# Patient Record
Sex: Female | Born: 1950
Health system: Southern US, Community
[De-identification: ages and names within clinical notes are randomized; demographics above are authoritative.]

## PROBLEM LIST (undated history)

## (undated) DIAGNOSIS — Z8739 Personal history of other diseases of the musculoskeletal system and connective tissue: Secondary | ICD-10-CM

## (undated) DIAGNOSIS — E213 Hyperparathyroidism, unspecified: Secondary | ICD-10-CM

## (undated) DIAGNOSIS — J45909 Unspecified asthma, uncomplicated: Secondary | ICD-10-CM

## (undated) DIAGNOSIS — M4300 Spondylolysis, site unspecified: Secondary | ICD-10-CM

## (undated) DIAGNOSIS — G935 Compression of brain: Secondary | ICD-10-CM

## (undated) DIAGNOSIS — E890 Postprocedural hypothyroidism: Secondary | ICD-10-CM

## (undated) DIAGNOSIS — I10 Essential (primary) hypertension: Secondary | ICD-10-CM

## (undated) DIAGNOSIS — M5416 Radiculopathy, lumbar region: Secondary | ICD-10-CM

## (undated) DIAGNOSIS — K219 Gastro-esophageal reflux disease without esophagitis: Secondary | ICD-10-CM

## (undated) DIAGNOSIS — D571 Sickle-cell disease without crisis: Secondary | ICD-10-CM

## (undated) DIAGNOSIS — E079 Disorder of thyroid, unspecified: Secondary | ICD-10-CM

## (undated) DIAGNOSIS — E785 Hyperlipidemia, unspecified: Secondary | ICD-10-CM

## (undated) HISTORY — DX: Spondylolysis, site unspecified: M43.00

## (undated) HISTORY — DX: Personal history of other diseases of the musculoskeletal system and connective tissue: Z87.39

## (undated) HISTORY — DX: Sickle-cell disease without crisis: D57.1

## (undated) HISTORY — DX: Hyperlipidemia, unspecified: E78.5

## (undated) HISTORY — DX: Radiculopathy, lumbar region: M54.16

## (undated) HISTORY — DX: Gastro-esophageal reflux disease without esophagitis: K21.9

## (undated) HISTORY — DX: Postprocedural hypothyroidism: E89.0

## (undated) HISTORY — DX: Compression of brain: G93.5

## (undated) HISTORY — PX: CERVICAL LAMINECTOMY: SHX94

## (undated) HISTORY — DX: Essential (primary) hypertension: I10

## (undated) HISTORY — DX: Unspecified asthma, uncomplicated: J45.909

## (undated) HISTORY — DX: Hyperparathyroidism, unspecified: E21.3

---

## 1992-01-26 HISTORY — PX: ABDOMINAL HYSTERECTOMY: SHX81

## 1994-01-25 HISTORY — PX: ABDOMINAL HYSTERECTOMY: SHX81

## 2004-05-12 ENCOUNTER — Encounter: Admission: RE | Admit: 2004-05-12 | Discharge: 2004-05-12 | Payer: Self-pay | Admitting: Family Medicine

## 2004-05-25 ENCOUNTER — Encounter: Admission: RE | Admit: 2004-05-25 | Discharge: 2004-05-25 | Payer: Self-pay | Admitting: Family Medicine

## 2006-01-17 ENCOUNTER — Ambulatory Visit: Payer: Self-pay | Admitting: Family Medicine

## 2006-05-03 ENCOUNTER — Ambulatory Visit: Payer: Self-pay | Admitting: Family Medicine

## 2006-05-03 LAB — CONVERTED CEMR LAB
BUN: 12 mg/dL (ref 6–23)
Calcium: 10.4 mg/dL (ref 8.4–10.5)
Chloride: 107 meq/L (ref 96–112)
Creatinine, Ser: 0.6 mg/dL (ref 0.4–1.2)
GFR calc non Af Amer: 111 mL/min
Glucose, Bld: 107 mg/dL — ABNORMAL HIGH (ref 70–99)
Sodium: 141 meq/L (ref 135–145)

## 2006-05-18 ENCOUNTER — Ambulatory Visit: Payer: Self-pay | Admitting: Family Medicine

## 2006-05-20 ENCOUNTER — Ambulatory Visit: Payer: Self-pay | Admitting: Family Medicine

## 2006-06-09 ENCOUNTER — Encounter (INDEPENDENT_AMBULATORY_CARE_PROVIDER_SITE_OTHER): Payer: Self-pay | Admitting: Family Medicine

## 2006-12-12 ENCOUNTER — Emergency Department (HOSPITAL_COMMUNITY): Admission: EM | Admit: 2006-12-12 | Discharge: 2006-12-12 | Payer: Self-pay | Admitting: Emergency Medicine

## 2006-12-12 ENCOUNTER — Ambulatory Visit: Payer: Self-pay | Admitting: Family Medicine

## 2007-07-20 ENCOUNTER — Encounter: Admission: RE | Admit: 2007-07-20 | Discharge: 2007-07-20 | Payer: Self-pay | Admitting: Endocrinology

## 2007-07-20 ENCOUNTER — Encounter (INDEPENDENT_AMBULATORY_CARE_PROVIDER_SITE_OTHER): Payer: Self-pay | Admitting: Interventional Radiology

## 2007-07-20 ENCOUNTER — Other Ambulatory Visit: Admission: RE | Admit: 2007-07-20 | Discharge: 2007-07-20 | Payer: Self-pay | Admitting: Interventional Radiology

## 2007-12-27 ENCOUNTER — Encounter (HOSPITAL_COMMUNITY): Admission: RE | Admit: 2007-12-27 | Discharge: 2008-01-25 | Payer: Self-pay | Admitting: Endocrinology

## 2007-12-29 ENCOUNTER — Ambulatory Visit (HOSPITAL_COMMUNITY): Admission: RE | Admit: 2007-12-29 | Discharge: 2007-12-29 | Payer: Self-pay | Admitting: Endocrinology

## 2008-12-07 ENCOUNTER — Ambulatory Visit: Payer: Self-pay | Admitting: Radiology

## 2008-12-07 ENCOUNTER — Emergency Department (HOSPITAL_BASED_OUTPATIENT_CLINIC_OR_DEPARTMENT_OTHER): Admission: EM | Admit: 2008-12-07 | Discharge: 2008-12-07 | Payer: Self-pay | Admitting: Emergency Medicine

## 2010-02-15 ENCOUNTER — Encounter: Payer: Self-pay | Admitting: Family Medicine
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
## 2010-11-12 ENCOUNTER — Emergency Department (INDEPENDENT_AMBULATORY_CARE_PROVIDER_SITE_OTHER): Payer: No Typology Code available for payment source

## 2010-11-12 ENCOUNTER — Emergency Department (HOSPITAL_BASED_OUTPATIENT_CLINIC_OR_DEPARTMENT_OTHER)
Admission: EM | Admit: 2010-11-12 | Discharge: 2010-11-12 | Disposition: A | Discharge status: Home / Self Care | Payer: No Typology Code available for payment source | Attending: Emergency Medicine | Admitting: Emergency Medicine

## 2010-11-12 ENCOUNTER — Encounter: Payer: Self-pay | Admitting: *Deleted

## 2010-11-12 DIAGNOSIS — E079 Disorder of thyroid, unspecified: Secondary | ICD-10-CM | POA: Insufficient documentation

## 2010-11-12 DIAGNOSIS — M25519 Pain in unspecified shoulder: Secondary | ICD-10-CM

## 2010-11-12 DIAGNOSIS — M542 Cervicalgia: Secondary | ICD-10-CM

## 2010-11-12 DIAGNOSIS — S161XXA Strain of muscle, fascia and tendon at neck level, initial encounter: Secondary | ICD-10-CM

## 2010-11-12 DIAGNOSIS — Y92410 Unspecified street and highway as the place of occurrence of the external cause: Secondary | ICD-10-CM | POA: Insufficient documentation

## 2010-11-12 DIAGNOSIS — E119 Type 2 diabetes mellitus without complications: Secondary | ICD-10-CM | POA: Insufficient documentation

## 2010-11-12 DIAGNOSIS — S139XXA Sprain of joints and ligaments of unspecified parts of neck, initial encounter: Secondary | ICD-10-CM | POA: Insufficient documentation

## 2010-11-12 HISTORY — DX: Disorder of thyroid, unspecified: E07.9

## 2010-11-12 MED ORDER — IBUPROFEN 800 MG PO TABS: 800.0000 mg | ORAL_TABLET | Freq: Three times a day (TID) | ORAL | Status: DC

## 2010-11-12 NOTE — ED Provider Notes (Signed)
Medical screening examination/treatment/procedure(s) were performed by non-physician practitioner and as supervising physician I was immediately available for consultation/collaboration.  Krystal Teachey R Earley Grobe, MD 11/12/10 2150 
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
## 2010-11-12 NOTE — ED Notes (Signed)
When in to d/c pt-pt states she has allergy to ibuprofen and forgot to advise staff upon arrival and if ok to take tylenol-EDNP Sofia advised and agreeable-pt was not given rx ibuprofen

## 2010-11-17 ENCOUNTER — Ambulatory Visit (INDEPENDENT_AMBULATORY_CARE_PROVIDER_SITE_OTHER): Payer: Self-pay | Admitting: Family Medicine

## 2010-11-17 ENCOUNTER — Encounter: Payer: Self-pay | Admitting: Family Medicine

## 2010-11-17 VITALS — BP 131/70 | HR 61 | Temp 97.9°F | Ht 67.0 in | Wt 180.0 lb

## 2010-11-17 DIAGNOSIS — M549 Dorsalgia, unspecified: Secondary | ICD-10-CM

## 2010-11-17 DIAGNOSIS — S060X0A Concussion without loss of consciousness, initial encounter: Secondary | ICD-10-CM

## 2010-11-17 DIAGNOSIS — S139XXA Sprain of joints and ligaments of unspecified parts of neck, initial encounter: Secondary | ICD-10-CM

## 2010-11-17 DIAGNOSIS — S161XXA Strain of muscle, fascia and tendon at neck level, initial encounter: Secondary | ICD-10-CM

## 2010-11-17 MED ORDER — CYCLOBENZAPRINE HCL 10 MG PO TABS: 10.0000 mg | ORAL_TABLET | Freq: Three times a day (TID) | ORAL | Status: DC | PRN

## 2010-11-17 MED ORDER — TRAMADOL HCL 50 MG PO TABS: 50.0000 mg | ORAL_TABLET | Freq: Four times a day (QID) | ORAL | Status: DC | PRN
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
## 2010-11-18 ENCOUNTER — Ambulatory Visit: Payer: No Typology Code available for payment source | Attending: Family Medicine | Admitting: Physical Therapy

## 2010-11-18 DIAGNOSIS — M2560 Stiffness of unspecified joint, not elsewhere classified: Secondary | ICD-10-CM | POA: Insufficient documentation

## 2010-11-18 DIAGNOSIS — IMO0001 Reserved for inherently not codable concepts without codable children: Secondary | ICD-10-CM | POA: Insufficient documentation

## 2010-11-18 DIAGNOSIS — M25519 Pain in unspecified shoulder: Secondary | ICD-10-CM | POA: Insufficient documentation

## 2010-11-18 DIAGNOSIS — M542 Cervicalgia: Secondary | ICD-10-CM | POA: Insufficient documentation

## 2010-11-19 ENCOUNTER — Encounter: Payer: Self-pay | Admitting: Family Medicine

## 2010-11-19 DIAGNOSIS — S060X0A Concussion without loss of consciousness, initial encounter: Secondary | ICD-10-CM | POA: Insufficient documentation

## 2010-11-19 NOTE — Assessment & Plan Note (Signed)
Lumbar/cervical strains - Start PT with home exercise program, flexeril and tramadol as needed.  Cannot take nsaids.  Heat as needed for spasms.  Discussed ergonomic issues.  Cervical collar as this felt more comfortable to patient.  F/u in 3-4 weeks for reevaluation.
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
## 2010-11-19 NOTE — Assessment & Plan Note (Signed)
patient not having symptoms at rest now but does have symptoms with mental exertion.  Will keep her out of work until Monday and attempt to return at that time.  If symptoms recur, discussed she will have to be out of work for longer period of time.  No red flag symptoms.

## 2010-11-23 ENCOUNTER — Ambulatory Visit: Payer: No Typology Code available for payment source | Admitting: Physical Therapy

## 2010-11-25 ENCOUNTER — Encounter: Payer: No Typology Code available for payment source | Admitting: Physical Therapy

## 2010-11-26 ENCOUNTER — Ambulatory Visit: Payer: No Typology Code available for payment source | Attending: Family Medicine

## 2010-11-26 DIAGNOSIS — M25519 Pain in unspecified shoulder: Secondary | ICD-10-CM | POA: Insufficient documentation

## 2010-11-26 DIAGNOSIS — IMO0001 Reserved for inherently not codable concepts without codable children: Secondary | ICD-10-CM | POA: Insufficient documentation

## 2010-11-26 DIAGNOSIS — M2560 Stiffness of unspecified joint, not elsewhere classified: Secondary | ICD-10-CM | POA: Insufficient documentation

## 2010-11-26 DIAGNOSIS — M542 Cervicalgia: Secondary | ICD-10-CM | POA: Insufficient documentation

## 2010-11-30 ENCOUNTER — Ambulatory Visit: Payer: No Typology Code available for payment source | Admitting: Physical Therapy

## 2010-12-11 ENCOUNTER — Encounter: Payer: Self-pay | Admitting: Family Medicine

## 2010-12-11 ENCOUNTER — Ambulatory Visit (INDEPENDENT_AMBULATORY_CARE_PROVIDER_SITE_OTHER): Payer: Self-pay | Admitting: Family Medicine

## 2010-12-11 VITALS — BP 122/74 | HR 68 | Temp 97.9°F | Ht 67.0 in | Wt 180.0 lb

## 2010-12-11 DIAGNOSIS — M549 Dorsalgia, unspecified: Secondary | ICD-10-CM

## 2010-12-11 NOTE — Patient Instructions (Addendum)
I want you to complete another 3 weeks of physical therapy at least - I know this is difficult but it's the most likely thing to improve your condition.  [id number] Tylenol extra strength 1-2 tablets three times a day as needed for pain. Simple range of motion exercises within limits of pain to prevent further stiffness. Heat 15 minutes at a time 3-4 times a day to help with spasms. Watch head position when on computers, texting, when sleeping in bed - should in line with back to prevent further nerve traction and irritation. If not improving we will consider an MRI, injections, other medications, or surgery depending on how you are doing and what further imaging shows. Follow up with me in 3-4 weeks if not improving as expected.

## 2010-12-14 ENCOUNTER — Encounter: Payer: Self-pay | Admitting: Family Medicine

## 2010-12-14 NOTE — Assessment & Plan Note (Signed)
Lumbar/cervical strains - Still believe her symptoms are indicative of muscle strain in lumbar and cervical regions.  Strongly urged her to complete a total of 6 weeks of PT and be compliant with home exercises before I would consider further imaging (MRI).  Continue with tylenol, heat.  Has cervical collar to use as needed as well.  F/u in 3-4 weeks for recheck if not improving.
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
## 2010-12-22 ENCOUNTER — Ambulatory Visit: Payer: No Typology Code available for payment source | Admitting: Physical Therapy

## 2010-12-23 ENCOUNTER — Ambulatory Visit: Payer: No Typology Code available for payment source | Admitting: Physical Therapy

## 2010-12-24 ENCOUNTER — Ambulatory Visit: Payer: No Typology Code available for payment source | Admitting: Physical Therapy

## 2010-12-28 ENCOUNTER — Ambulatory Visit: Payer: No Typology Code available for payment source | Attending: Family Medicine | Admitting: Physical Therapy

## 2010-12-28 DIAGNOSIS — IMO0001 Reserved for inherently not codable concepts without codable children: Secondary | ICD-10-CM | POA: Insufficient documentation

## 2010-12-28 DIAGNOSIS — M25519 Pain in unspecified shoulder: Secondary | ICD-10-CM | POA: Insufficient documentation

## 2010-12-28 DIAGNOSIS — M2560 Stiffness of unspecified joint, not elsewhere classified: Secondary | ICD-10-CM | POA: Insufficient documentation

## 2010-12-28 DIAGNOSIS — M542 Cervicalgia: Secondary | ICD-10-CM | POA: Insufficient documentation

## 2010-12-29 ENCOUNTER — Ambulatory Visit: Payer: No Typology Code available for payment source | Admitting: Physical Therapy

## 2010-12-31 ENCOUNTER — Ambulatory Visit: Payer: No Typology Code available for payment source | Admitting: Physical Therapy

## 2011-01-04 ENCOUNTER — Ambulatory Visit: Payer: No Typology Code available for payment source | Admitting: Physical Therapy

## 2011-01-06 ENCOUNTER — Encounter: Payer: No Typology Code available for payment source | Admitting: Physical Therapy

## 2011-01-07 ENCOUNTER — Encounter: Payer: Self-pay | Admitting: Family Medicine

## 2011-01-07 ENCOUNTER — Ambulatory Visit: Payer: No Typology Code available for payment source | Admitting: Physical Therapy

## 2011-01-07 ENCOUNTER — Ambulatory Visit: Payer: No Typology Code available for payment source | Admitting: Family Medicine

## 2011-01-07 ENCOUNTER — Ambulatory Visit (INDEPENDENT_AMBULATORY_CARE_PROVIDER_SITE_OTHER): Payer: Self-pay | Admitting: Family Medicine

## 2011-01-07 DIAGNOSIS — M542 Cervicalgia: Secondary | ICD-10-CM

## 2011-01-07 DIAGNOSIS — M549 Dorsalgia, unspecified: Secondary | ICD-10-CM

## 2011-01-08 ENCOUNTER — Ambulatory Visit (HOSPITAL_BASED_OUTPATIENT_CLINIC_OR_DEPARTMENT_OTHER): Payer: No Typology Code available for payment source

## 2011-01-08 ENCOUNTER — Encounter: Payer: Self-pay | Admitting: Family Medicine
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
## 2011-01-09 ENCOUNTER — Ambulatory Visit (INDEPENDENT_AMBULATORY_CARE_PROVIDER_SITE_OTHER)
Admission: RE | Admit: 2011-01-09 | Discharge: 2011-01-09 | Disposition: A | Discharge status: Home / Self Care | Payer: No Typology Code available for payment source | Source: Ambulatory Visit | Attending: Family Medicine | Admitting: Family Medicine

## 2011-01-09 ENCOUNTER — Ambulatory Visit (HOSPITAL_BASED_OUTPATIENT_CLINIC_OR_DEPARTMENT_OTHER)
Admission: RE | Admit: 2011-01-09 | Discharge: 2011-01-09 | Disposition: A | Discharge status: Home / Self Care | Payer: No Typology Code available for payment source | Source: Ambulatory Visit | Attending: Family Medicine | Admitting: Family Medicine

## 2011-01-09 DIAGNOSIS — M5126 Other intervertebral disc displacement, lumbar region: Secondary | ICD-10-CM

## 2011-01-09 DIAGNOSIS — M545 Low back pain, unspecified: Secondary | ICD-10-CM

## 2011-01-09 DIAGNOSIS — M5090 Cervical disc disorder, unspecified, unspecified cervical region: Secondary | ICD-10-CM | POA: Insufficient documentation

## 2011-01-09 DIAGNOSIS — M549 Dorsalgia, unspecified: Secondary | ICD-10-CM

## 2011-01-09 DIAGNOSIS — G935 Compression of brain: Secondary | ICD-10-CM | POA: Insufficient documentation

## 2011-01-09 DIAGNOSIS — M5020 Other cervical disc displacement, unspecified cervical region: Secondary | ICD-10-CM | POA: Insufficient documentation

## 2011-01-09 DIAGNOSIS — M542 Cervicalgia: Secondary | ICD-10-CM | POA: Insufficient documentation

## 2011-01-09 DIAGNOSIS — M48061 Spinal stenosis, lumbar region without neurogenic claudication: Secondary | ICD-10-CM

## 2011-01-14 ENCOUNTER — Ambulatory Visit: Payer: No Typology Code available for payment source | Admitting: Physical Therapy

## 2011-01-20 ENCOUNTER — Encounter: Payer: Self-pay | Admitting: Family Medicine

## 2011-01-20 ENCOUNTER — Ambulatory Visit (INDEPENDENT_AMBULATORY_CARE_PROVIDER_SITE_OTHER): Payer: No Typology Code available for payment source | Admitting: Family Medicine

## 2011-01-20 ENCOUNTER — Ambulatory Visit: Payer: No Typology Code available for payment source | Admitting: Physical Therapy

## 2011-01-20 DIAGNOSIS — M549 Dorsalgia, unspecified: Secondary | ICD-10-CM

## 2011-01-20 DIAGNOSIS — M542 Cervicalgia: Secondary | ICD-10-CM

## 2011-01-21 ENCOUNTER — Encounter: Payer: No Typology Code available for payment source | Admitting: Physical Therapy

## 2011-01-21 ENCOUNTER — Encounter: Payer: Self-pay | Admitting: Family Medicine
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
## 2011-03-30 ENCOUNTER — Ambulatory Visit (INDEPENDENT_AMBULATORY_CARE_PROVIDER_SITE_OTHER): Payer: BC Managed Care – PPO | Admitting: Family Medicine

## 2011-03-30 ENCOUNTER — Encounter: Payer: Self-pay | Admitting: Family Medicine

## 2011-03-30 DIAGNOSIS — M549 Dorsalgia, unspecified: Secondary | ICD-10-CM

## 2011-03-30 DIAGNOSIS — M542 Cervicalgia: Secondary | ICD-10-CM

## 2011-03-31 ENCOUNTER — Encounter: Payer: Self-pay | Admitting: Family Medicine
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
## 2011-03-31 NOTE — Assessment & Plan Note (Signed)
a secondary concern to her neck pain now - she does have evidence of lumbar radiculopathy but believe most of her current symptoms are due to #1.
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
## 2011-04-02 ENCOUNTER — Telehealth: Payer: Self-pay | Admitting: *Deleted

## 2011-04-02 NOTE — Telephone Encounter (Signed)
Pt returned call.  States she has been taking 1/2 flexeril, and 1/4 tramadol because the tramadol makes her dizzy.  Advised her that Dr. Pearletha Forge would call in something different for pain if she would like.  She states she does not want anything stronger.  Suggested that she try increasing to at  least 1/2 tramadol or a whole tab if she can tolerate it without too much dizziness.  Also suggested that she take tylenol in between doses of tramadol b/c it has been helpful for her pain, and she cannot take NSAIDs 2/2 stomach upset.  Asked her to call back Monday if she has further concerns, and let her know that Gunnar Fusi is working on her Neurosurg appt.

## 2011-04-02 NOTE — Telephone Encounter (Signed)
Message copied by Mora Bellman on Fri Apr 02, 2011 10:43 AM ------      Message from: Darius Bump D      Created: Fri Apr 02, 2011  9:32 AM      Regarding: question      Contact: [phone]       Ms. Lequita Halt is taking her meds but still having a lot of pain.  I think Dr. Pearletha Forge has probably already explained to her what to do but could you find out?  We are waiting for her appt with Neuro but have not heard yet and I told her that.  I don't know what to tell her about the pain in the meantime though.  Thanks/lp

## 2011-04-02 NOTE — Telephone Encounter (Signed)
Called pt, left VM to return my call

## 2011-07-05 HISTORY — PX: CRANIECTOMY SUBOCCIPITAL W/ CERVICAL LAMINECTOMY / CHIARI: SUR327

## 2011-08-04 ENCOUNTER — Encounter (INDEPENDENT_AMBULATORY_CARE_PROVIDER_SITE_OTHER): Payer: BC Managed Care – PPO | Admitting: Ophthalmology

## 2011-08-04 DIAGNOSIS — E1139 Type 2 diabetes mellitus with other diabetic ophthalmic complication: Secondary | ICD-10-CM

## 2011-08-04 DIAGNOSIS — H2510 Age-related nuclear cataract, unspecified eye: Secondary | ICD-10-CM

## 2011-08-04 DIAGNOSIS — H43819 Vitreous degeneration, unspecified eye: Secondary | ICD-10-CM

## 2011-08-04 DIAGNOSIS — E11319 Type 2 diabetes mellitus with unspecified diabetic retinopathy without macular edema: Secondary | ICD-10-CM

## 2011-08-04 DIAGNOSIS — H33309 Unspecified retinal break, unspecified eye: Secondary | ICD-10-CM

## 2011-08-04 DIAGNOSIS — H3530 Unspecified macular degeneration: Secondary | ICD-10-CM

## 2011-08-12 ENCOUNTER — Ambulatory Visit (INDEPENDENT_AMBULATORY_CARE_PROVIDER_SITE_OTHER): Payer: BC Managed Care – PPO | Admitting: Ophthalmology

## 2011-08-12 DIAGNOSIS — H33309 Unspecified retinal break, unspecified eye: Secondary | ICD-10-CM

## 2011-08-31 ENCOUNTER — Ambulatory Visit: Payer: BC Managed Care – PPO | Attending: Neurosurgery | Admitting: Rehabilitation

## 2011-08-31 DIAGNOSIS — IMO0001 Reserved for inherently not codable concepts without codable children: Secondary | ICD-10-CM | POA: Insufficient documentation

## 2011-08-31 DIAGNOSIS — R293 Abnormal posture: Secondary | ICD-10-CM | POA: Insufficient documentation

## 2011-08-31 DIAGNOSIS — M542 Cervicalgia: Secondary | ICD-10-CM | POA: Insufficient documentation

## 2011-09-02 ENCOUNTER — Ambulatory Visit: Payer: BC Managed Care – PPO | Admitting: Rehabilitation

## 2011-09-03 ENCOUNTER — Ambulatory Visit: Payer: BC Managed Care – PPO | Admitting: Rehabilitation

## 2011-09-06 ENCOUNTER — Ambulatory Visit: Payer: BC Managed Care – PPO | Admitting: Rehabilitation

## 2011-09-08 ENCOUNTER — Ambulatory Visit: Payer: BC Managed Care – PPO | Admitting: Rehabilitation

## 2011-12-15 ENCOUNTER — Ambulatory Visit (INDEPENDENT_AMBULATORY_CARE_PROVIDER_SITE_OTHER): Payer: BC Managed Care – PPO | Admitting: Ophthalmology

## 2011-12-15 DIAGNOSIS — H2510 Age-related nuclear cataract, unspecified eye: Secondary | ICD-10-CM

## 2011-12-15 DIAGNOSIS — H33309 Unspecified retinal break, unspecified eye: Secondary | ICD-10-CM

## 2011-12-15 DIAGNOSIS — H3530 Unspecified macular degeneration: Secondary | ICD-10-CM

## 2011-12-15 DIAGNOSIS — H43819 Vitreous degeneration, unspecified eye: Secondary | ICD-10-CM

## 2012-07-28 LAB — HM COLONOSCOPY

## 2012-08-11 LAB — HM COLONOSCOPY

## 2012-12-15 ENCOUNTER — Ambulatory Visit (INDEPENDENT_AMBULATORY_CARE_PROVIDER_SITE_OTHER): Payer: BC Managed Care – PPO | Admitting: Ophthalmology

## 2012-12-15 DIAGNOSIS — E1139 Type 2 diabetes mellitus with other diabetic ophthalmic complication: Secondary | ICD-10-CM

## 2012-12-15 DIAGNOSIS — H33309 Unspecified retinal break, unspecified eye: Secondary | ICD-10-CM

## 2012-12-15 DIAGNOSIS — H2510 Age-related nuclear cataract, unspecified eye: Secondary | ICD-10-CM

## 2012-12-15 DIAGNOSIS — H43819 Vitreous degeneration, unspecified eye: Secondary | ICD-10-CM

## 2012-12-15 DIAGNOSIS — E11319 Type 2 diabetes mellitus with unspecified diabetic retinopathy without macular edema: Secondary | ICD-10-CM

## 2012-12-20 ENCOUNTER — Other Ambulatory Visit: Payer: Self-pay | Admitting: Internal Medicine

## 2013-01-25 HISTORY — PX: BREAST BIOPSY: SHX20

## 2013-01-25 HISTORY — PX: PARATHYROIDECTOMY: SHX19

## 2013-12-19 ENCOUNTER — Ambulatory Visit (INDEPENDENT_AMBULATORY_CARE_PROVIDER_SITE_OTHER): Payer: BC Managed Care – PPO | Admitting: Ophthalmology

## 2013-12-19 DIAGNOSIS — E11329 Type 2 diabetes mellitus with mild nonproliferative diabetic retinopathy without macular edema: Secondary | ICD-10-CM

## 2013-12-19 DIAGNOSIS — H33302 Unspecified retinal break, left eye: Secondary | ICD-10-CM

## 2013-12-19 DIAGNOSIS — H43813 Vitreous degeneration, bilateral: Secondary | ICD-10-CM

## 2013-12-19 DIAGNOSIS — H3509 Other intraretinal microvascular abnormalities: Secondary | ICD-10-CM

## 2013-12-19 DIAGNOSIS — E11319 Type 2 diabetes mellitus with unspecified diabetic retinopathy without macular edema: Secondary | ICD-10-CM

## 2014-12-06 ENCOUNTER — Telehealth: Payer: Self-pay

## 2014-12-06 NOTE — Telephone Encounter (Signed)
Pt has Chiari I Malformation and Sickle Cell Anemia, vast records located in [facility].

## 2014-12-06 NOTE — Telephone Encounter (Signed)
Patient called and I informed her that Dr. Drue NovelPaz is not accepting new patients at this time however she looked on her insurance Website and it states that he is she stated that she is looking for a new provider that is fluent in spanish and she has already placed Dr. Drue NovelPaz as her provider with her insurance which is which is [employer]. Will you see this patient the ER assigned you as her PCP in the system which I will change if you decline

## 2014-12-09 NOTE — Telephone Encounter (Signed)
Okay to accept her as a new pt. Needs a new patient appointment at her earliest convenience

## 2014-12-23 ENCOUNTER — Ambulatory Visit (INDEPENDENT_AMBULATORY_CARE_PROVIDER_SITE_OTHER): Payer: BC Managed Care – PPO | Admitting: Ophthalmology

## 2014-12-23 DIAGNOSIS — E11319 Type 2 diabetes mellitus with unspecified diabetic retinopathy without macular edema: Secondary | ICD-10-CM | POA: Diagnosis not present

## 2014-12-23 DIAGNOSIS — E113293 Type 2 diabetes mellitus with mild nonproliferative diabetic retinopathy without macular edema, bilateral: Secondary | ICD-10-CM | POA: Diagnosis not present

## 2014-12-23 DIAGNOSIS — H33302 Unspecified retinal break, left eye: Secondary | ICD-10-CM | POA: Diagnosis not present

## 2014-12-23 DIAGNOSIS — I10 Essential (primary) hypertension: Secondary | ICD-10-CM

## 2014-12-23 DIAGNOSIS — H35033 Hypertensive retinopathy, bilateral: Secondary | ICD-10-CM

## 2014-12-23 DIAGNOSIS — H43813 Vitreous degeneration, bilateral: Secondary | ICD-10-CM

## 2015-01-02 ENCOUNTER — Ambulatory Visit (INDEPENDENT_AMBULATORY_CARE_PROVIDER_SITE_OTHER): Payer: BC Managed Care – PPO | Admitting: Medical

## 2015-01-02 ENCOUNTER — Other Ambulatory Visit: Payer: Self-pay | Admitting: Medical

## 2015-01-02 ENCOUNTER — Ambulatory Visit (HOSPITAL_BASED_OUTPATIENT_CLINIC_OR_DEPARTMENT_OTHER)
Admission: RE | Admit: 2015-01-02 | Discharge: 2015-01-02 | Disposition: A | Discharge status: Home / Self Care | Payer: BC Managed Care – PPO | Source: Ambulatory Visit | Attending: Medical | Admitting: Medical

## 2015-01-02 ENCOUNTER — Encounter: Payer: Self-pay | Admitting: Medical

## 2015-01-02 VITALS — BP 110/72 | HR 61 | Temp 97.8°F | Ht 67.0 in | Wt 186.8 lb

## 2015-01-02 DIAGNOSIS — M25551 Pain in right hip: Secondary | ICD-10-CM

## 2015-01-02 DIAGNOSIS — M47896 Other spondylosis, lumbar region: Secondary | ICD-10-CM | POA: Insufficient documentation

## 2015-01-02 DIAGNOSIS — J309 Allergic rhinitis, unspecified: Secondary | ICD-10-CM | POA: Diagnosis not present

## 2015-01-02 DIAGNOSIS — R5383 Other fatigue: Secondary | ICD-10-CM

## 2015-01-02 DIAGNOSIS — E119 Type 2 diabetes mellitus without complications: Secondary | ICD-10-CM

## 2015-01-02 DIAGNOSIS — E079 Disorder of thyroid, unspecified: Secondary | ICD-10-CM | POA: Diagnosis not present

## 2015-01-02 DIAGNOSIS — J0100 Acute maxillary sinusitis, unspecified: Secondary | ICD-10-CM | POA: Diagnosis not present

## 2015-01-02 DIAGNOSIS — J3089 Other allergic rhinitis: Secondary | ICD-10-CM

## 2015-01-02 MED ORDER — FLUTICASONE PROPIONATE 50 MCG/ACT NA SUSP: 2.0000 | Freq: Every day | NASAL | Status: DC

## 2015-01-02 MED ORDER — CEFDINIR 300 MG PO CAPS: 300.0000 mg | ORAL_CAPSULE | Freq: Two times a day (BID) | ORAL | Status: DC

## 2015-01-02 NOTE — Assessment & Plan Note (Signed)
rx flonase. Start allegra after current sinus infection.

## 2015-01-02 NOTE — Progress Notes (Signed)
Pre visit review using our clinic review tool, if applicable. No additional management support is needed unless otherwise documented below in the visit note. 

## 2015-01-02 NOTE — Progress Notes (Signed)
Subjective:    Patient ID: Abigail [name], female    DOB: 31-Aug-1950, 64 y.o.   MRN: [id number]  HPI  Pt in for first time. She plans to see Dr. Drue NovelPaz.  I have reviewed pt PMH, PSH, FH, Social History and Surgical History.  Pt has diabetes- Pt is on metformin. On med for 2 yrs.Pt not sure what her a1-c was or when it was done.  Pt has hx of para thyroid surgery.  Hypothyroid- pt not on any medications.Pt last saw Dr. Allena KatzPatel in 2015.   Pt she has nasal congestion, runny nose and coughing for 2 wks.Last night felt fever. Not productive cough.For your htn I want you to continue with losartan/hctz. Continue this med and will advise taking 5 mg of the amlodipine q day.   Follow up in 2 weeks or as needed   November 11 went to [facility] and they benzonatate and azithromycin. About one month ago she    Rt knee pain for many years. Pain is severe. Pt got tramadol from [facility] and did not help. At night pain.   Some rt hip pain as well for years. Pain since 2012.    Review of Systems  Constitutional: Positive for fever and fatigue. Negative for chills.  HENT: Positive for congestion, postnasal drip and sinus pressure. Negative for ear pain, rhinorrhea and voice change.   Respiratory: Negative for cough, chest tightness, shortness of breath and wheezing.   Cardiovascular: Negative for chest pain and palpitations.  Musculoskeletal: Negative for back pain.       Rt hip and knee pain.  Neurological: Negative for dizziness and headaches.  Hematological: Negative for adenopathy. Does not bruise/bleed easily. Marland Kitchen. Psychiatric/Behavioral: Negative for behavioral problems and confusion.   Past Medical History  Diagnosis Date  . Diabetes mellitus   . Thyroid disease     Social History   Social History  . Marital Status: Married    Spouse Name: N/A  . Number of Children: N/A  . Years of Education: N/A   Occupational History  . Not on file.   Social History Main  Topics  . Smoking status: Never Smoker   . Smokeless tobacco: Not on file  . Alcohol Use: No  . Drug Use: No  . Sexual Activity: Yes   Other Topics Concern  . Not on file   Social History Narrative    Past Surgical History  Procedure Laterality Date  . Abdominal hysterectomy      Family History  Problem Relation Age of Onset  . Hypertension Mother   . Hypertension Sister   . Hyperlipidemia Sister   . Sudden death Neg Hx   . Heart attack Neg Hx   . Diabetes Daughter   . Hypertension Daughter     Allergies  Allergen Reactions  . Citrus Itching, Rash and Swelling  . Ibuprofen Swelling and Rash    Pt now states that she can not take ibuprofen  . Other Itching, Rash and Swelling    seafood. seafood    Current Outpatient Prescriptions on File Prior to Visit  Medication Sig Dispense Refill  . aspirin 81 MG tablet Take 81 mg by mouth daily.      . calcium citrate-vitamin D (CITRACAL+D) 315-200 MG-UNIT per tablet Take 1 tablet by mouth 2 (two) times daily.      . metFORMIN (GLUCOPHAGE) 500 MG tablet Take 500 mg by mouth 2 (two) times daily with a meal.       No current  facility-administered medications on file prior to visit.    BP 110/72 mmHg  Pulse 61  Temp(Src) 97.8 F (36.6 C) (Oral)  Ht  (1.702 m)  Wt 186 lb 12.8 oz (84.732 kg)  BMI 29.25 kg/m2  SpO2 98%       Objective:   Physical Exam  General  Mental Status - Alert. General Appearance - Well groomed. Not in acute distress.  Skin Rashes- No Rashes.  Neck- no thyromegaly  HEENT Head- Normal. Ear Auditory Canal - Left- Normal. Right - Normal.Tympanic Membrane- Left- Normal. Right- Normal. Eye Sclera/Conjunctiva- Left- Normal. Right- Normal. Nose & Sinuses Nasal Mucosa- Left-  Boggy and Congested. Right-  Boggy and  Congested.Bilateral maxillary and frontal sinus pressure. Mouth & Throat Lips: Upper Lip- Normal: no dryness, cracking, pallor, cyanosis, or vesicular eruption. Lower  Lip-Normal: no dryness, cracking, pallor, cyanosis or vesicular eruption. Buccal Mucosa- Bilateral- No Aphthous ulcers. Oropharynx- No Discharge or Erythema. +pnd Tonsils: Characteristics- Bilateral- No Erythema or Congestion. Size/Enlargement- Bilateral- No enlargement. Discharge- bilateral-None.  Neck Neck- Supple. No Masses.   Chest and Lung Exam Auscultation: Breath Sounds:-Clear even and unlabored.  Cardiovascular Auscultation:Rythm- Regular, rate and rhythm. Murmurs & Other Heart Sounds:Ausculatation of the heart reveal- No Murmurs.  Lymphatic Head & Neck General Head & Neck Lymphatics: Bilateral: Description- No Localized lymphadenopathy.  Rt hip- mild pain on rom. More pain on direct palpatoin.  Rt knee- no crepitus. No warmth or swelling. Medial tibial plateau region pain.      Assessment & Plan:  For sinus infection. Rx cefdnir(not think she will tolerate this better than other antibiotics)  For allergy rhinitis history start flonase and continue. When sinus pressure relieved you can start allegra otc.  For diabetes get a1-c today. For fatigue get cbc and cmp.  For hx of thyroid disorder get tsh today.  For hip pain get xray today. Also sign release form so we can get recent report of your rt knee xray. Follow up in with Dr. Drue Novel in 2 wks or as needed with me.

## 2015-01-02 NOTE — Assessment & Plan Note (Signed)
Get tsh. May refer to endocrinologist due to history.

## 2015-01-02 NOTE — Patient Instructions (Addendum)
For sinus infection. Rx cefdnir(not think she will tolerate this better than other antibiotics)  For allergy rhinitis history start flonase and continue. When sinus pressure relieved you can start allegra otc.  For diabetes get a1-c today. Continue metformin. For fatigue get cbc and cmp.  For hx of thyroid disorder get tsh today.  For hip pain get xray today. Also sign release form so we can get recent report of your rt knee xray. Follow up in with Dr. Drue NovelPaz in 2 wks or as needed with me.   For knee pain continue with low dose tramadol and tylenol

## 2015-01-02 NOTE — Assessment & Plan Note (Signed)
For diabetes get a1-c today. Continue metformin.

## 2015-01-03 LAB — CBC WITH DIFFERENTIAL/PLATELET
BASOS PCT: 0.9 % (ref 0.0–3.0)
Basophils Absolute: 0.1 10*3/uL (ref 0.0–0.1)
EOS ABS: 0.2 10*3/uL (ref 0.0–0.7)
EOS PCT: 3.9 % (ref 0.0–5.0)
HCT: 36.3 % (ref 36.0–46.0)
HEMOGLOBIN: 12 g/dL (ref 12.0–15.0)
LYMPHS ABS: 2.4 10*3/uL (ref 0.7–4.0)
Lymphocytes Relative: 38.7 % (ref 12.0–46.0)
MCHC: 33.2 g/dL (ref 30.0–36.0)
MCV: 87.7 fl (ref 78.0–100.0)
MONO ABS: 0.4 10*3/uL (ref 0.1–1.0)
Monocytes Relative: 7 % (ref 3.0–12.0)
NEUTROS ABS: 3 10*3/uL (ref 1.4–7.7)
NEUTROS PCT: 49.5 % (ref 43.0–77.0)
PLATELETS: 293 10*3/uL (ref 150.0–400.0)
RBC: 4.14 Mil/uL (ref 3.87–5.11)
RDW: 13.9 % (ref 11.5–15.5)
WBC: 6.2 10*3/uL (ref 4.0–10.5)

## 2015-01-03 LAB — TSH: TSH: 1.73 u[IU]/mL (ref 0.35–4.50)

## 2015-01-03 LAB — HEMOGLOBIN A1C: Hgb A1c MFr Bld: 7 % — ABNORMAL HIGH (ref 4.6–6.5)

## 2015-01-03 LAB — COMPREHENSIVE METABOLIC PANEL
ALBUMIN: 4.4 g/dL (ref 3.5–5.2)
ALT: 17 U/L (ref 0–35)
AST: 20 U/L (ref 0–37)
Alkaline Phosphatase: 76 U/L (ref 39–117)
BUN: 17 mg/dL (ref 6–23)
CHLORIDE: 104 meq/L (ref 96–112)
CO2: 31 meq/L (ref 19–32)
Calcium: 9.2 mg/dL (ref 8.4–10.5)
Creatinine, Ser: 0.97 mg/dL (ref 0.40–1.20)
GFR: 74.62 mL/min (ref >60.00)
Glucose, Bld: 97 mg/dL (ref 70–99)
POTASSIUM: 4.3 meq/L (ref 3.5–5.1)
SODIUM: 142 meq/L (ref 135–145)
Total Bilirubin: 0.3 mg/dL (ref 0.2–1.2)
Total Protein: 7.1 g/dL (ref 6.0–8.3)

## 2015-01-09 ENCOUNTER — Other Ambulatory Visit: Payer: Self-pay

## 2015-01-10 ENCOUNTER — Encounter: Payer: Self-pay | Admitting: Internal Medicine

## 2015-01-10 ENCOUNTER — Ambulatory Visit (INDEPENDENT_AMBULATORY_CARE_PROVIDER_SITE_OTHER): Payer: BC Managed Care – PPO | Admitting: Internal Medicine

## 2015-01-10 VITALS — BP 124/72 | HR 84 | Temp 98.2°F | Ht 67.0 in | Wt 191.0 lb

## 2015-01-10 DIAGNOSIS — M25561 Pain in right knee: Secondary | ICD-10-CM

## 2015-01-10 DIAGNOSIS — E079 Disorder of thyroid, unspecified: Secondary | ICD-10-CM | POA: Diagnosis not present

## 2015-01-10 DIAGNOSIS — Z23 Encounter for immunization: Secondary | ICD-10-CM

## 2015-01-10 DIAGNOSIS — G935 Compression of brain: Secondary | ICD-10-CM | POA: Insufficient documentation

## 2015-01-10 DIAGNOSIS — M5416 Radiculopathy, lumbar region: Secondary | ICD-10-CM | POA: Insufficient documentation

## 2015-01-10 DIAGNOSIS — E119 Type 2 diabetes mellitus without complications: Secondary | ICD-10-CM

## 2015-01-10 DIAGNOSIS — D571 Sickle-cell disease without crisis: Secondary | ICD-10-CM | POA: Insufficient documentation

## 2015-01-10 DIAGNOSIS — M4300 Spondylolysis, site unspecified: Secondary | ICD-10-CM | POA: Insufficient documentation

## 2015-01-10 DIAGNOSIS — Z1211 Encounter for screening for malignant neoplasm of colon: Secondary | ICD-10-CM

## 2015-01-10 DIAGNOSIS — E213 Hyperparathyroidism, unspecified: Secondary | ICD-10-CM | POA: Diagnosis not present

## 2015-01-10 DIAGNOSIS — Z09 Encounter for follow-up examination after completed treatment for conditions other than malignant neoplasm: Secondary | ICD-10-CM

## 2015-01-10 DIAGNOSIS — Z8739 Personal history of other diseases of the musculoskeletal system and connective tissue: Secondary | ICD-10-CM | POA: Insufficient documentation

## 2015-01-10 MED ORDER — METFORMIN HCL 850 MG PO TABS: 850.0000 mg | ORAL_TABLET | Freq: Two times a day (BID) | ORAL | Status: DC

## 2015-01-10 NOTE — Progress Notes (Signed)
Pre visit review using our clinic review tool, if applicable. No additional management support is needed unless otherwise documented below in the visit note. 
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
## 2015-01-10 NOTE — Patient Instructions (Signed)
Will refer you to  see the orthopedic doctor and endocrinologist  Metformin: Change to 850 mg twice a day  Next visit in 3 months, fasting, please make an appointment

## 2015-01-23 DIAGNOSIS — Z09 Encounter for follow-up examination after completed treatment for conditions other than malignant neoplasm: Secondary | ICD-10-CM | POA: Insufficient documentation

## 2015-01-23 NOTE — Assessment & Plan Note (Signed)
DM: on Metformin 500 bid, recent A1c 7.0, recommend to change to metformin 850 twice a day. Thyroid disease: Recent TSH satisfactory Hyperparathyroidism, status post surgery, last calcium normal. Continue with supplements. Request a referral to see Dr. Elvera LennoxGherghe, will do. MSK: DJD: C/o R knee and hip pain. Chart reviewed: XR R KNEE 12-06-2014 mild DJD . Recommend orthopedic surgery eval, suspect the pain is combination of DJD and a meniscal issue given the location of pain. Primary care: Flu shot and Pneumovax today. RTC 3 months, fasting

## 2015-02-06 ENCOUNTER — Ambulatory Visit: Payer: BC Managed Care – PPO | Admitting: Internal Medicine

## 2015-03-18 ENCOUNTER — Ambulatory Visit (INDEPENDENT_AMBULATORY_CARE_PROVIDER_SITE_OTHER): Payer: BC Managed Care – PPO | Admitting: Physician Assistant

## 2015-03-18 ENCOUNTER — Encounter: Payer: Self-pay | Admitting: Physician Assistant

## 2015-03-18 VITALS — BP 119/60 | HR 94 | Temp 98.1°F | Ht 67.0 in | Wt 182.0 lb

## 2015-03-18 DIAGNOSIS — J019 Acute sinusitis, unspecified: Secondary | ICD-10-CM

## 2015-03-18 DIAGNOSIS — B9689 Other specified bacterial agents as the cause of diseases classified elsewhere: Secondary | ICD-10-CM | POA: Insufficient documentation

## 2015-03-18 MED ORDER — HYDROCODONE-HOMATROPINE 5-1.5 MG/5ML PO SYRP
5.0000 mL | ORAL_SOLUTION | Freq: Three times a day (TID) | ORAL | Status: DC | PRN
Start: 2015-03-18 — End: 2015-05-05

## 2015-03-18 MED ORDER — AMOXICILLIN-POT CLAVULANATE 875-125 MG PO TABS: 1.0000 | ORAL_TABLET | Freq: Two times a day (BID) | ORAL | Status: DC

## 2015-03-18 NOTE — Progress Notes (Signed)
Patient presents to clinic today c/o 5-6 days of worsening cough, sinus pressure, sinus pain, facial pain and aches. Endorses feeling feverish last night . Endorses sick contacts at school. Endorses history of sinus infections. Has taken flonase with only some relief in symptoms .   Past Medical History  Diagnosis Date  . Diabetes mellitus   . Thyroid disease   . Chiari malformation type I (HCC)   . Sickle cell anemia (HCC)   . Lumbar radiculopathy   . Spondylolysis   . Postablative hypothyroidism   . Hyperparathyroidism (HCC)   . Hypertension   . GERD (gastroesophageal reflux disease)   . Asthma   . Hx of gout   . Hyperlipidemia     Current Outpatient Prescriptions on File Prior to Visit  Medication Sig Dispense Refill  . aspirin 81 MG tablet Take 81 mg by mouth daily.      . calcium citrate-vitamin D (CITRACAL+D) 315-200 MG-UNIT per tablet Take 1 tablet by mouth 2 (two) times daily. Reported on 01/10/2015    . fluticasone (FLONASE) 50 MCG/ACT nasal spray Place 2 sprays into both nostrils daily. 16 g 11  . glucose blood (ONE TOUCH ULTRA TEST) test strip Reported on 01/10/2015    . metFORMIN (GLUCOPHAGE) 850 MG tablet Take 1 tablet (850 mg total) by mouth 2 (two) times daily with a meal. 60 tablet 6   No current facility-administered medications on file prior to visit.    Allergies  Allergen Reactions  . Citrus Itching, Rash and Swelling  . Ibuprofen Swelling and Rash    Pt now states that she can not take ibuprofen  . Other Itching, Rash and Swelling    seafood. seafood    Family History  Problem Relation Age of Onset  . Colon cancer Neg Hx   . Sudden death Neg Hx   . Heart attack Neg Hx   . Breast cancer Neg Hx   . Hypertension Sister     sister, daughter , mother   . Hyperlipidemia Sister   . Diabetes Daughter   . Stroke Mother     Social History   Social History  . Marital Status: Married    Spouse Name: N/A  . Number of Children: 1  . Years of  Education: N/A   Occupational History  . teacher     Social History Main Topics  . Smoking status: Never Smoker   . Smokeless tobacco: None  . Alcohol Use: No  . Drug Use: No  . Sexual Activity: Yes   Other Topics Concern  . None   Social History Narrative   Original from [place]   Married x 44 years   Review of Systems - See HPI.  All other ROS are negative.  BP 119/60 mmHg  Pulse 94  Temp(Src) 98.1 F (36.7 C) (Oral)  Ht  (1.702 m)  Wt 182 lb (82.555 kg)  BMI 28.50 kg/m2  SpO2 100%  Physical Exam  Constitutional: She is oriented to person, place, and time and well-developed, well-nourished, and in no distress.  HENT:  Head: Normocephalic and atraumatic.  Right Ear: Tympanic membrane and external ear normal.  Left Ear: Tympanic membrane and external ear normal.  Nose: Mucosal edema present. Right sinus exhibits frontal sinus tenderness. Left sinus exhibits frontal sinus tenderness.  Mouth/Throat: Uvula is midline, oropharynx is clear and moist and mucous membranes are normal.  Cardiovascular: Normal rate, regular rhythm, normal heart sounds and intact distal pulses.   Pulmonary/Chest: Effort normal  and breath sounds normal. No respiratory distress. She has no wheezes. She has no rales. She exhibits no tenderness.  Neurological: She is alert and oriented to person, place, and time.  Skin: Skin is warm and dry. No rash noted.  Psychiatric: Affect normal.  Vitals reviewed.   Recent Results (from the past 2160 hour(s))  Hemoglobin A1c     Status: Abnormal   Collection Time: 01/02/15  4:43 PM  Result Value Ref Range   Hgb A1c MFr Bld 7.0 (H) 4.6 - 6.5 %    Comment: Glycemic Control Guidelines for People with Diabetes:Non Diabetic:  <6%Goal of Therapy: <7%Additional Action Suggested:  >8%   CBC w/Diff     Status: None   Collection Time: 01/02/15  4:43 PM  Result Value Ref Range   WBC 6.2 4.0 - 10.5 K/uL   RBC 4.14 3.87 - 5.11 Mil/uL   Hemoglobin 12.0 12.0 -  15.0 g/dL   HCT 40.9 81.1 - 91.4 %   MCV 87.7 78.0 - 100.0 fl   MCHC 33.2 30.0 - 36.0 g/dL   RDW 78.2 95.6 - 21.3 %   Platelets 293.0 150.0 - 400.0 K/uL   Neutrophils Relative % 49.5 43.0 - 77.0 %   Lymphocytes Relative 38.7 12.0 - 46.0 %   Monocytes Relative 7.0 3.0 - 12.0 %   Eosinophils Relative 3.9 0.0 - 5.0 %   Basophils Relative 0.9 0.0 - 3.0 %   Neutro Abs 3.0 1.4 - 7.7 K/uL   Lymphs Abs 2.4 0.7 - 4.0 K/uL   Monocytes Absolute 0.4 0.1 - 1.0 K/uL   Eosinophils Absolute 0.2 0.0 - 0.7 K/uL   Basophils Absolute 0.1 0.0 - 0.1 K/uL  Comprehensive metabolic panel     Status: None   Collection Time: 01/02/15  4:43 PM  Result Value Ref Range   Sodium 142 135 - 145 mEq/L   Potassium 4.3 3.5 - 5.1 mEq/L   Chloride 104 96 - 112 mEq/L   CO2 31 19 - 32 mEq/L   Glucose, Bld 97 70 - 99 mg/dL   BUN 17 6 - 23 mg/dL   Creatinine, Ser 0.86 0.40 - 1.20 mg/dL   Total Bilirubin 0.3 0.2 - 1.2 mg/dL   Alkaline Phosphatase 76 39 - 117 U/L   AST 20 0 - 37 U/L   ALT 17 0 - 35 U/L   Total Protein 7.1 6.0 - 8.3 g/dL   Albumin 4.4 3.5 - 5.2 g/dL   Calcium 9.2 8.4 - 57.8 mg/dL   GFR 46.96 >29.52 mL/min  TSH     Status: None   Collection Time: 01/02/15  4:43 PM  Result Value Ref Range   TSH 1.73 0.35 - 4.50 uIU/mL    Assessment/Plan: No problem-specific assessment & plan notes found for this encounter.

## 2015-03-18 NOTE — Patient Instructions (Signed)
Please take antibiotic as directed.  Increase fluid intake.  Use Saline nasal spray.  Take a daily multivitamin. Use the cough syrup as directed.  Place a humidifier in the bedroom.  Please call or return clinic if symptoms are not improving.  Sinusitis Sinusitis is redness, soreness, and swelling (inflammation) of the paranasal sinuses. Paranasal sinuses are air pockets within the bones of your face (beneath the eyes, the middle of the forehead, or above the eyes). In healthy paranasal sinuses, mucus is able to drain out, and air is able to circulate through them by way of your nose. However, when your paranasal sinuses are inflamed, mucus and air can become trapped. This can allow bacteria and other germs to grow and cause infection. Sinusitis can develop quickly and last only a short time (acute) or continue over a long period (chronic). Sinusitis that lasts for more than 12 weeks is considered chronic.  CAUSES  Causes of sinusitis include:  Allergies.  Structural abnormalities, such as displacement of the cartilage that separates your nostrils (deviated septum), which can decrease the air flow through your nose and sinuses and affect sinus drainage.  Functional abnormalities, such as when the small hairs (cilia) that line your sinuses and help remove mucus do not work properly or are not present. SYMPTOMS  Symptoms of acute and chronic sinusitis are the same. The primary symptoms are pain and pressure around the affected sinuses. Other symptoms include:  Upper toothache.  Earache.  Headache.  Bad breath.  Decreased sense of smell and taste.  A cough, which worsens when you are lying flat.  Fatigue.  Fever.  Thick drainage from your nose, which often is green and may contain pus (purulent).  Swelling and warmth over the affected sinuses. DIAGNOSIS  Your caregiver will perform a physical exam. During the exam, your caregiver may:  Look in your nose for signs of abnormal  growths in your nostrils (nasal polyps).  Tap over the affected sinus to check for signs of infection.  View the inside of your sinuses (endoscopy) with a special imaging device with a light attached (endoscope), which is inserted into your sinuses. If your caregiver suspects that you have chronic sinusitis, one or more of the following tests may be recommended:  Allergy tests.  Nasal culture A sample of mucus is taken from your nose and sent to a lab and screened for bacteria.  Nasal cytology A sample of mucus is taken from your nose and examined by your caregiver to determine if your sinusitis is related to an allergy. TREATMENT  Most cases of acute sinusitis are related to a viral infection and will resolve on their own within 10 days. Sometimes medicines are prescribed to help relieve symptoms (pain medicine, decongestants, nasal steroid sprays, or saline sprays).  However, for sinusitis related to a bacterial infection, your caregiver will prescribe antibiotic medicines. These are medicines that will help kill the bacteria causing the infection.  Rarely, sinusitis is caused by a fungal infection. In theses cases, your caregiver will prescribe antifungal medicine. For some cases of chronic sinusitis, surgery is needed. Generally, these are cases in which sinusitis recurs more than 3 times per year, despite other treatments. HOME CARE INSTRUCTIONS   Drink plenty of water. Water helps thin the mucus so your sinuses can drain more easily.  Use a humidifier.  Inhale steam 3 to 4 times a day (for example, sit in the bathroom with the shower running).  Apply a warm, moist washcloth to your face  3 to 4 times a day, or as directed by your caregiver.  Use saline nasal sprays to help moisten and clean your sinuses.  Take over-the-counter or prescription medicines for pain, discomfort, or fever only as directed by your caregiver. SEEK IMMEDIATE MEDICAL CARE IF:  You have increasing pain or  severe headaches.  You have nausea, vomiting, or drowsiness.  You have swelling around your face.  You have vision problems.  You have a stiff neck.  You have difficulty breathing. MAKE SURE YOU:   Understand these instructions.  Will watch your condition.  Will get help right away if you are not doing well or get worse. Document Released: 01/11/2005 Document Revised: 04/05/2011 Document Reviewed: 01/26/2011 Exit[employer] Patient Information 2014 [employer].   

## 2015-03-18 NOTE — Progress Notes (Signed)
Pre visit review using our clinic review tool, if applicable. No additional management support is needed unless otherwise documented below in the visit note. 

## 2015-03-19 ENCOUNTER — Telehealth: Payer: Self-pay | Admitting: Physician Assistant

## 2015-03-19 MED ORDER — AZITHROMYCIN 250 MG PO TABS: ORAL_TABLET | ORAL | Status: DC

## 2015-03-19 NOTE — Telephone Encounter (Signed)
Using [employer] [place] [facility]), called to follow up with patient.  Was unable to reach patient via number listed.  Will try again later.

## 2015-03-19 NOTE — Telephone Encounter (Signed)
Letter printed and placed up front for pick up.

## 2015-03-19 NOTE — Telephone Encounter (Signed)
Likely a side effect of the Augmentin. Stop immediately. Increase fluids, rest. Start Z-pack -- Rx has been sent. If anything worsens overnight after stopping medication she will need to be seen.

## 2015-03-19 NOTE — Telephone Encounter (Signed)
Caller name: Karena Addison Relation to pt: self  Call back number: [phone] Pharmacy:  Reason for call:  Pt states was seen yesterday 03-18-15 for a bad cold and body aching, pt already started her rx that was prescribed to her yesterday and states is feeling worst with vomits and diarrhea, pt thinks it might be the rx given to here. Please advise ASAP.

## 2015-03-19 NOTE — Telephone Encounter (Signed)
Ok to write note. Thank you

## 2015-03-19 NOTE — Telephone Encounter (Addendum)
Called patient back.  Provider's recommendations discussed.  Pt stated understanding and agrees with plan.  She says that she will send someone out to pick up Rx.  Also, she does not think she will be able to go to work tomorrow and is requesting a work note.  Ok to write work note?

## 2015-03-19 NOTE — Telephone Encounter (Signed)
Please try again. Patient does speak English well so translator line will likely not be needed.

## 2015-03-26 ENCOUNTER — Ambulatory Visit: Payer: BC Managed Care – PPO | Admitting: Internal Medicine

## 2015-04-29 ENCOUNTER — Ambulatory Visit: Payer: BC Managed Care – PPO | Admitting: Internal Medicine

## 2015-05-05 ENCOUNTER — Ambulatory Visit (INDEPENDENT_AMBULATORY_CARE_PROVIDER_SITE_OTHER): Payer: BC Managed Care – PPO | Admitting: Internal Medicine

## 2015-05-05 ENCOUNTER — Encounter: Payer: Self-pay | Admitting: Internal Medicine

## 2015-05-05 VITALS — BP 122/58 | HR 74 | Temp 98.2°F | Ht 67.0 in | Wt 180.5 lb

## 2015-05-05 DIAGNOSIS — E213 Hyperparathyroidism, unspecified: Secondary | ICD-10-CM | POA: Diagnosis not present

## 2015-05-05 DIAGNOSIS — E119 Type 2 diabetes mellitus without complications: Secondary | ICD-10-CM

## 2015-05-05 DIAGNOSIS — Z09 Encounter for follow-up examination after completed treatment for conditions other than malignant neoplasm: Secondary | ICD-10-CM

## 2015-05-05 DIAGNOSIS — Z23 Encounter for immunization: Secondary | ICD-10-CM

## 2015-05-05 DIAGNOSIS — Z780 Asymptomatic menopausal state: Secondary | ICD-10-CM

## 2015-05-05 DIAGNOSIS — Z1231 Encounter for screening mammogram for malignant neoplasm of breast: Secondary | ICD-10-CM

## 2015-05-05 MED ORDER — MELOXICAM 15 MG PO TABS: 15.0000 mg | ORAL_TABLET | Freq: Every day | ORAL | Status: DC

## 2015-05-05 NOTE — Patient Instructions (Signed)
GO TO THE FRONT DESK  Schedule labs to be done tomorrow, fasting Schedule your next appointment for a  checkup in 3 months, nonfasting.   Take  metformin half tablet twice a day  Go back on your vitamin D and calcium supplements  Use of meloxicam as needed only,  always take it with food because may cause gastritis and ulcers.  If you notice nausea, stomach pain, change in the color of stools --->  Stop the medicine and let us know

## 2015-05-05 NOTE — Assessment & Plan Note (Addendum)
DM: Not taking a full metformin dose due to GI side effects, recommend to take 850 mg half tablet twice a day, check A1c, FLP, microalbumin. consider add Januvia. H/o vitamin D deficiency: Recommend to go back on calcium and vitamin D Hyperparathyroidism: To see endocrinology tomorrow MSK: Saw orthopedic surgery, dx knee tendinitis, responding to meloxicam, refill provided, GI side effects discussed. Primary care: Td today, order a mammogram and a bone density test. RTC 3 months

## 2015-05-05 NOTE — Progress Notes (Signed)
Pre visit review using our clinic review tool, if applicable. No additional management support is needed unless otherwise documented below in the visit note. 
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
## 2015-05-06 ENCOUNTER — Ambulatory Visit: Payer: BC Managed Care – PPO | Admitting: Internal Medicine

## 2015-05-06 ENCOUNTER — Encounter: Payer: Self-pay | Admitting: Internal Medicine

## 2015-05-06 ENCOUNTER — Ambulatory Visit (INDEPENDENT_AMBULATORY_CARE_PROVIDER_SITE_OTHER): Payer: BC Managed Care – PPO | Admitting: Internal Medicine

## 2015-05-06 ENCOUNTER — Other Ambulatory Visit (INDEPENDENT_AMBULATORY_CARE_PROVIDER_SITE_OTHER): Payer: BC Managed Care – PPO

## 2015-05-06 VITALS — BP 112/60 | HR 86 | Temp 98.1°F | Resp 12 | Ht 67.5 in | Wt 182.0 lb

## 2015-05-06 DIAGNOSIS — E119 Type 2 diabetes mellitus without complications: Secondary | ICD-10-CM

## 2015-05-06 DIAGNOSIS — E1165 Type 2 diabetes mellitus with hyperglycemia: Secondary | ICD-10-CM | POA: Insufficient documentation

## 2015-05-06 DIAGNOSIS — E210 Primary hyperparathyroidism: Secondary | ICD-10-CM | POA: Insufficient documentation

## 2015-05-06 LAB — LIPID PANEL
CHOL/HDL RATIO: 3
Cholesterol: 169 mg/dL (ref 0–200)
HDL: 59.2 mg/dL (ref >39.00)
LDL CALC: 96 mg/dL (ref 0–99)
NONHDL: 110.14
Triglycerides: 71 mg/dL (ref 0.0–149.0)
VLDL: 14.2 mg/dL (ref 0.0–40.0)

## 2015-05-06 LAB — MICROALBUMIN / CREATININE URINE RATIO
Creatinine,U: 172.3 mg/dL
MICROALB/CREAT RATIO: 0.6 mg/g (ref 0.0–30.0)
Microalb, Ur: 1 mg/dL (ref 0.0–1.9)

## 2015-05-06 LAB — HEMOGLOBIN A1C: HEMOGLOBIN A1C: 6.7 % — AB (ref 4.6–6.5)

## 2015-05-06 MED ORDER — METFORMIN HCL ER 500 MG PO TB24: 500.0000 mg | ORAL_TABLET | Freq: Every day | ORAL | Status: DC

## 2015-05-06 NOTE — Progress Notes (Addendum)
Patient ID: Abigail [name], female   DOB: [birth year], 65 y.o.   MRN: [id number]  HPI: Abigail [name] is a 65 y.o.-year-old female, referred by her PCP, Dr. Drue NovelPaz, for management of DM2, dx in ~2010, non-insulin-dependent, uncontrolled, without complications and h/o primary HPTH. Her husband, Abigail [name], is also a patient of mine and patient is usually accompanying him all his appointments.  Last hemoglobin A1c was: Lab Results  Component Value Date   HGBA1C 6.7* 05/06/2015   HGBA1C 7.0* 01/02/2015   Pt is on a regimen of: - Metformin 1/2 tablet of 850 mg 4x a day - some diarrhea and abd pain  Pt checks her sugars 0-1x a day and they are: - am: 89-110 - 2h after b'fast: n/c - before lunch: n/c - 2h after lunch: n/c - before dinner: n/c - 2h after dinner: 120-135 - bedtime: n/c - nighttime: n/c No lows. Lowest sugar was 89; she has hypoglycemia awareness at 80s.  Highest sugar was 240x1 (dietary indiscretions).  Glucometer: ReliOn  Pt's meals are: - Breakfast: fruits, eggs - Lunch: chicken/fish + veggies - no red meat  - Dinner: veggies + fruit +/- chicken/fish - Snacks: cashews  - no CKD, last BUN/creatinine:  Lab Results  Component Value Date   BUN 17 01/02/2015   CREATININE 0.97 01/02/2015   - last set of lipids: Lab Results  Component Value Date   CHOL 169 05/06/2015   HDL 59.20 05/06/2015   LDLCALC 96 05/06/2015   TRIG 71.0 05/06/2015   CHOLHDL 3 05/06/2015   - last eye exam was in 11/2014. No DR.  - + numbness and tingling in her feet. Foot exam normal 05/06/2015.  Pt has no FH of DM.  She has a h/o primary hyperparathyroidism - s/p 2x parathyroid glands excised in 2015, with resolution of hypercalcemia.  She occasionally takes Tums x1 when feels weak - not every day.  Latest calcium levels normal: Lab Results  Component Value Date   CALCIUM 9.2 01/02/2015   CALCIUM 10.4 05/03/2006   ROS: Constitutional: + weight gain, no fatigue,  no subjective hyperthermia/hypothermia Eyes: no blurry vision, no xerophthalmia ENT: no sore throat, no nodules palpated in throat, + dysphagia/no odynophagia, no hoarseness Cardiovascular: no CP/SOB/palpitations/+ leg swelling Respiratory: no cough/SOB Gastrointestinal: no N/V/D/C/ + heartburn Musculoskeletal: no muscle/+ joint aches Skin: no rashes, + hair loss Neurological: no tremors/numbness/tingling/dizziness Psychiatric: no depression/anxiety  Past Medical History  Diagnosis Date  . Diabetes mellitus   . Thyroid disease   . Chiari malformation type I (HCC)   . Sickle cell anemia (HCC)   . Lumbar radiculopathy   . Spondylolysis   . Postablative hypothyroidism   . Hyperparathyroidism (HCC)   . Hypertension   . GERD (gastroesophageal reflux disease)   . Asthma   . Hx of gout   . Hyperlipidemia    Past Surgical History  Procedure Laterality Date  . Parathyroidectomy  2015    [facility]   . Craniectomy suboccipital w/ cervical laminectomy / chiari  07/05/2011    [facility]  . Cervical laminectomy      at C1 w/ duraplasty  . Abdominal hysterectomy  1996    NO oophorectomy   Social History   Social History  . Marital Status: Married    Spouse Name: N/A  . Number of Children: 1   Occupational History  . teacher - high school     Social History Main Topics  . Smoking status: Never Smoker   .  Smokeless tobacco: Not on file  . Alcohol Use: No  . Drug Use: No   Social History Narrative   Original from [place]   Married x 44 years   Current Outpatient Prescriptions on File Prior to Visit  Medication Sig Dispense Refill  . aspirin 81 MG tablet Take 81 mg by mouth daily.      . Biotin 5000 MCG TABS Take 1 tablet by mouth daily.    . calcium citrate-vitamin D (CITRACAL+D) 315-200 MG-UNIT per tablet Take 1 tablet by mouth 2 (two) times daily. Reported on 01/10/2015    . calcium elemental as carbonate (BARIATRIC TUMS ULTRA) 400 MG chewable tablet Chew  1,000 mg by mouth as needed for heartburn.    . fexofenadine (ALLEGRA) 180 MG tablet Take 180 mg by mouth daily.    Marland Kitchen glucose blood (ONE TOUCH ULTRA TEST) test strip Reported on 05/05/2015    . meloxicam (MOBIC) 15 MG tablet Take 1 tablet (15 mg total) by mouth daily. 90 tablet 0   No current facility-administered medications on file prior to visit.   Allergies  Allergen Reactions  . Citrus Itching, Rash and Swelling  . Ibuprofen Swelling and Rash    Pt now states that she can not take ibuprofen  . Other Itching, Rash and Swelling    seafood. seafood   Family History  Problem Relation Age of Onset  . Colon cancer Neg Hx   . Sudden death Neg Hx   . Heart attack Neg Hx   . Breast cancer Neg Hx   . Hypertension Sister     sister, daughter , mother   . Hyperlipidemia Sister   . Diabetes Daughter   . Stroke Mother    PE: BP 112/60 mmHg  Pulse 86  Temp(Src) 98.1 F (36.7 C) (Oral)  Resp 12  Ht 5' 7.5" (1.715 m)  Wt 182 lb (82.555 kg)  BMI 28.07 kg/m2  SpO2 97% Wt Readings from Last 3 Encounters:  05/06/15 182 lb (82.555 kg)  05/05/15 180 lb 8 oz (81.874 kg)  03/18/15 182 lb (82.555 kg)   Constitutional: overweight, in NAD Eyes: PERRLA, EOMI, no exophthalmos ENT: moist mucous membranes, no thyromegaly, no cervical lymphadenopathy Cardiovascular: RRR, No MRG Respiratory: CTA B Gastrointestinal: abdomen soft, NT, ND, BS+ Musculoskeletal: no deformities, strength intact in all 4 Skin: moist, warm, no rashes Neurological: no tremor with outstretched hands, DTR normal in all 4, negative bilateral Chvostek sign  ASSESSMENT: 1. DM2, non-insulin-dependent, uncontrolled, without complications  2. History of hyperparathyroidism -  status post 2 gland parathyroidectomy in 2015   PLAN:  1. Patient with uncontrolled diabetes, on oral antidiabetic regimen, now with improved control - HbA1c from today: 6.7%. - She is having some GI symptoms with her regular metformin, so we will  switch to metformin extended-release 500 mg 3 times a day and I advised her to move the toe dinner if she can tolerate this well. - I suggested to:  Patient Instructions  Please stop the regular Metformin and start Metformin ER 500 mg 3x a day.   Please return in 3 months with your sugar log.   - Strongly advised her to start checking sugars at different times of the day - check once a day, rotating checks - given sugar log and advised how to fill it and to bring it at next appt  - given foot care handout and explained the principles  - given instructions for hypoglycemia management "15-15 rule"  - advised for yearly  eye exams  - Return to clinic in 3 mo with sugar log   2. History of primary hyperparathyroidism - S/p parathyroidectomy >> recent normal calcium levels - Chvostek sign negative bilaterally - On prn 1xTums when feeling tired or with weakness in legs.

## 2015-05-06 NOTE — Patient Instructions (Signed)
Please stop the regular Metformin and start Metformin ER 500 mg 3x a day.   Please return in 3 months with your sugar log.   PATIENT INSTRUCTIONS FOR TYPE 2 DIABETES:  **Please join MyChart!** - see attached instructions about how to join if you have not done so already.  DIET AND EXERCISE Diet and exercise is an important part of diabetic treatment.  We recommended aerobic exercise in the form of brisk walking (working between 40-60% of maximal aerobic capacity, similar to brisk walking) for 150 minutes per week (such as 30 minutes five days per week) along with 3 times per week performing 'resistance' training (using various gauge rubber tubes with handles) 5-10 exercises involving the major muscle groups (upper body, lower body and core) performing 10-15 repetitions (or near fatigue) each exercise. Start at half the above goal but build slowly to reach the above goals. If limited by weight, joint pain, or disability, we recommend daily walking in a swimming pool with water up to waist to reduce pressure from joints while allow for adequate exercise.    BLOOD GLUCOSES Monitoring your blood glucoses is important for continued management of your diabetes. Please check your blood glucoses 2-4 times a day: fasting, before meals and at bedtime (you can rotate these measurements - e.g. one day check before the 3 meals, the next day check before 2 of the meals and before bedtime, etc.).   HYPOGLYCEMIA (low blood sugar) Hypoglycemia is usually a reaction to not eating, exercising, or taking too much insulin/ other diabetes drugs.  Symptoms include tremors, sweating, hunger, confusion, headache, etc. Treat IMMEDIATELY with 15 grams of Carbs: . 4 glucose tablets .  cup regular juice/soda . 2 tablespoons raisins . 4 teaspoons sugar . 1 tablespoon honey Recheck blood glucose in 15 mins and repeat above if still symptomatic/blood glucose <100.  RECOMMENDATIONS TO REDUCE YOUR RISK OF DIABETIC  COMPLICATIONS: * Take your prescribed MEDICATION(S) * Follow a DIABETIC diet: Complex carbs, fiber rich foods, (monounsaturated and polyunsaturated) fats * AVOID saturated/trans fats, high fat foods, >2,300 mg salt per day. * EXERCISE at least 5 times a week for 30 minutes or preferably daily.  * DO NOT SMOKE OR DRINK more than 1 drink a day. * Check your FEET every day. Do not wear tightfitting shoes. Contact us if you develop an ulcer * See your EYE doctor once a year or more if needed * Get a FLU shot once a year * Get a PNEUMONIA vaccine once before and once after age 63 years  GOALS:  * Your Hemoglobin A1c of <7%  * fasting sugars need to be <130 * after meals sugars need to be <180 (2h after you start eating) * Your Systolic BP should be 140 or lower  * Your Diastolic BP should be 80 or lower  * Your HDL (Good Cholesterol) should be 40 or higher  * Your LDL (Bad Cholesterol) should be 100 or lower. * Your Triglycerides should be 150 or lower  * Your Urine microalbumin (kidney function) should be <30 * Your Body Mass Index should be 25 or lower    Please consider the following ways to cut down carbs and fat and increase fiber and micronutrients in your diet: - substitute whole grain for white bread or pasta - substitute brown rice for white rice - substitute 90-calorie flat bread pieces for slices of bread when possible - substitute sweet potatoes or yams for white potatoes - substitute humus for margarine - substitute  tofu for cheese when possible - substitute almond or rice milk for regular milk (would not drink soy milk daily due to concern for soy estrogen influence on breast cancer risk) - substitute dark chocolate for other sweets when possible - substitute water - can add lemon or orange slices for taste - for diet sodas (artificial sweeteners will trick your body that you can eat sweets without getting calories and will lead you to overeating and weight gain in the long  run) - do not skip breakfast or other meals (this will slow down the metabolism and will result in more weight gain over time)  - can try smoothies made from fruit and almond/rice milk in am instead of regular breakfast - can also try old-fashioned (not instant) oatmeal made with almond/rice milk in am - order the dressing on the side when eating salad at a restaurant (pour less than half of the dressing on the salad) - eat as little meat as possible - can try juicing, but should not forget that juicing will get rid of the fiber, so would alternate with eating raw veg./fruits or drinking smoothies - use as little oil as possible, even when using olive oil - can dress a salad with a mix of balsamic vinegar and lemon juice, for e.g. - use agave nectar, stevia sugar, or regular sugar rather than artificial sweateners - steam or broil/roast veggies  - snack on veggies/fruit/nuts (unsalted, preferably) when possible, rather than processed foods - reduce or eliminate aspartame in diet (it is in diet sodas, chewing gum, etc) Read the labels!  Try to read Dr. Janene Harvey book: "Program for Reversing Diabetes" for other ideas for healthy eating.

## 2015-05-20 ENCOUNTER — Ambulatory Visit (HOSPITAL_BASED_OUTPATIENT_CLINIC_OR_DEPARTMENT_OTHER)
Admission: RE | Admit: 2015-05-20 | Discharge: 2015-05-20 | Disposition: A | Discharge status: Home / Self Care | Payer: BC Managed Care – PPO | Source: Ambulatory Visit | Attending: Internal Medicine | Admitting: Internal Medicine

## 2015-05-20 DIAGNOSIS — Z1239 Encounter for other screening for malignant neoplasm of breast: Secondary | ICD-10-CM | POA: Insufficient documentation

## 2015-05-20 DIAGNOSIS — Z13820 Encounter for screening for osteoporosis: Secondary | ICD-10-CM | POA: Insufficient documentation

## 2015-05-20 DIAGNOSIS — Z780 Asymptomatic menopausal state: Secondary | ICD-10-CM | POA: Insufficient documentation

## 2015-05-20 DIAGNOSIS — Z1231 Encounter for screening mammogram for malignant neoplasm of breast: Secondary | ICD-10-CM | POA: Insufficient documentation

## 2015-07-09 ENCOUNTER — Ambulatory Visit (INDEPENDENT_AMBULATORY_CARE_PROVIDER_SITE_OTHER): Payer: BC Managed Care – PPO | Admitting: Internal Medicine

## 2015-07-09 ENCOUNTER — Encounter: Payer: Self-pay | Admitting: Internal Medicine

## 2015-07-09 VITALS — BP 118/72 | HR 90 | Temp 98.2°F | Ht 68.0 in | Wt 181.4 lb

## 2015-07-09 DIAGNOSIS — Z01419 Encounter for gynecological examination (general) (routine) without abnormal findings: Secondary | ICD-10-CM | POA: Diagnosis not present

## 2015-07-09 DIAGNOSIS — R5383 Other fatigue: Secondary | ICD-10-CM

## 2015-07-09 DIAGNOSIS — R609 Edema, unspecified: Secondary | ICD-10-CM

## 2015-07-09 LAB — VITAMIN B12: Vitamin B-12: 394 pg/mL (ref 211–911)

## 2015-07-09 LAB — CBC WITH DIFFERENTIAL/PLATELET
BASOS PCT: 0.7 % (ref 0.0–3.0)
Basophils Absolute: 0 10*3/uL (ref 0.0–0.1)
EOS ABS: 0.3 10*3/uL (ref 0.0–0.7)
EOS PCT: 5.5 % — AB (ref 0.0–5.0)
HEMATOCRIT: 37.7 % (ref 36.0–46.0)
HEMOGLOBIN: 12.5 g/dL (ref 12.0–15.0)
LYMPHS PCT: 35.5 % (ref 12.0–46.0)
Lymphs Abs: 1.9 10*3/uL (ref 0.7–4.0)
MCHC: 33.2 g/dL (ref 30.0–36.0)
MCV: 86.8 fl (ref 78.0–100.0)
Monocytes Absolute: 0.3 10*3/uL (ref 0.1–1.0)
Monocytes Relative: 6.2 % (ref 3.0–12.0)
Neutro Abs: 2.7 10*3/uL (ref 1.4–7.7)
Neutrophils Relative %: 52.1 % (ref 43.0–77.0)
Platelets: 287 10*3/uL (ref 150.0–400.0)
RBC: 4.34 Mil/uL (ref 3.87–5.11)
RDW: 13.8 % (ref 11.5–15.5)
WBC: 5.2 10*3/uL (ref 4.0–10.5)

## 2015-07-09 LAB — BASIC METABOLIC PANEL
BUN: 16 mg/dL (ref 6–23)
CHLORIDE: 103 meq/L (ref 96–112)
CO2: 28 mEq/L (ref 19–32)
Calcium: 9.2 mg/dL (ref 8.4–10.5)
Creatinine, Ser: 1.02 mg/dL (ref 0.40–1.20)
GFR: 70.3 mL/min (ref >60.00)
Glucose, Bld: 127 mg/dL — ABNORMAL HIGH (ref 70–99)
POTASSIUM: 4 meq/L (ref 3.5–5.1)
Sodium: 140 mEq/L (ref 135–145)

## 2015-07-09 LAB — FOLATE: FOLATE: 14 ng/mL (ref >5.9)

## 2015-07-09 LAB — SEDIMENTATION RATE: Sed Rate: 6 mm/hr (ref 0–30)

## 2015-07-09 NOTE — Patient Instructions (Signed)
GO TO THE LAB : Get the blood work     GO TO THE FRONT DESK Schedule your next appointment for a  routine checkup in 3 months  Swelling: Low salt diet  Leg elevation  Compression stockings?   Low-Sodium Eating Plan Sodium raises blood pressure and causes water to be held in the body. Getting less sodium from food will help lower your blood pressure, reduce any swelling, and protect your heart, liver, and kidneys. We get sodium by adding salt (sodium chloride) to food. Most of our sodium comes from canned, boxed, and frozen foods. Restaurant foods, fast foods, and pizza are also very high in sodium. Even if you take medicine to lower your blood pressure or to reduce fluid in your body, getting less sodium from your food is important. WHAT IS MY PLAN? Most people should limit their sodium intake to 2,300 mg a day. Your health care provider recommends that you limit your sodium intake to __________ a day.  WHAT DO I NEED TO KNOW ABOUT THIS EATING PLAN? For the low-sodium eating plan, you will follow these general guidelines:  Choose foods with a % Daily Value for sodium of less than 5% (as listed on the food label).   Use salt-free seasonings or herbs instead of table salt or sea salt.   Check with your health care provider or pharmacist before using salt substitutes.   Eat fresh foods.  Eat more vegetables and fruits.  Limit canned vegetables. If you do use them, rinse them well to decrease the sodium.   Limit cheese to 1 oz (28 g) per day.   Eat lower-sodium products, often labeled as "lower sodium" or "no salt added."  Avoid foods that contain monosodium glutamate (MSG). MSG is sometimes added to [place] food and some canned foods.  Check food labels (Nutrition Facts labels) on foods to learn how much sodium is in one serving.  Eat more home-cooked food and less restaurant, buffet, and fast food.  When eating at a restaurant, ask that your food be prepared with  less salt, or no salt if possible.  HOW DO I READ FOOD LABELS FOR SODIUM INFORMATION? The Nutrition Facts label lists the amount of sodium in one serving of the food. If you eat more than one serving, you must multiply the listed amount of sodium by the number of servings. Food labels may also identify foods as:  Sodium free--Less than 5 mg in a serving.  Very low sodium--35 mg or less in a serving.  Low sodium--140 mg or less in a serving.  Light in sodium--50% less sodium in a serving. For example, if a food that usually has 300 mg of sodium is changed to become light in sodium, it will have 150 mg of sodium.  Reduced sodium--25% less sodium in a serving. For example, if a food that usually has 400 mg of sodium is changed to reduced sodium, it will have 300 mg of sodium. WHAT FOODS CAN I EAT? Grains Low-sodium cereals, including oats, puffed wheat and rice, and shredded wheat cereals. Low-sodium crackers. Unsalted rice and pasta. Lower-sodium bread.  Vegetables Frozen or fresh vegetables. Low-sodium or reduced-sodium canned vegetables. Low-sodium or reduced-sodium tomato sauce and paste. Low-sodium or reduced-sodium tomato and vegetable juices.  Fruits Fresh, frozen, and canned fruit. Fruit juice.  Meat and Other Protein Products Low-sodium canned tuna and salmon. Fresh or frozen meat, poultry, seafood, and fish. Lamb. Unsalted nuts. Dried beans, peas, and lentils without added salt. Unsalted canned  beans. Homemade soups without salt. Eggs.  Dairy Milk. Soy milk. Ricotta cheese. Low-sodium or reduced-sodium cheeses. Yogurt.  Condiments Fresh and dried herbs and spices. Salt-free seasonings. Onion and garlic powders. Low-sodium varieties of mustard and ketchup. Fresh or refrigerated horseradish. Lemon juice.  Fats and Oils Reduced-sodium salad dressings. Unsalted butter.  Other Unsalted popcorn and pretzels.  The items listed above may not be a complete list of recommended  foods or beverages. Contact your dietitian for more options. WHAT FOODS ARE NOT RECOMMENDED? Grains Instant hot cereals. Bread stuffing, pancake, and biscuit mixes. Croutons. Seasoned rice or pasta mixes. Noodle soup cups. Boxed or frozen macaroni and cheese. Self-rising flour. Regular salted crackers. Vegetables Regular canned vegetables. Regular canned tomato sauce and paste. Regular tomato and vegetable juices. Frozen vegetables in sauces. Salted [place]French fries. Olives. Rosita FirePickles. Relishes. Sauerkraut. Salsa. Meat and Other Protein Products Salted, canned, smoked, spiced, or pickled meats, seafood, or fish. Bacon, ham, sausage, hot dogs, corned beef, chipped beef, and packaged luncheon meats. Salt pork. Jerky. Pickled herring. Anchovies, regular canned tuna, and sardines. Salted nuts. Dairy Processed cheese and cheese spreads. Cheese curds. Blue cheese and cottage cheese. Buttermilk.  Condiments Onion and garlic salt, seasoned salt, table salt, and sea salt. Canned and packaged gravies. Worcestershire sauce. Tartar sauce. Barbecue sauce. Teriyaki sauce. Soy sauce, including reduced sodium. Steak sauce. Fish sauce. Oyster sauce. Cocktail sauce. Horseradish that you find on the shelf. Regular ketchup and mustard. Meat flavorings and tenderizers. Bouillon cubes. Hot sauce. Tabasco sauce. Marinades. Taco seasonings. Relishes. Fats and Oils Regular salad dressings. Salted butter. Margarine. Ghee. Bacon fat.  Other Potato and tortilla chips. Corn chips and puffs. Salted popcorn and pretzels. Canned or dried soups. Pizza. Frozen entrees and pot pies.  The items listed above may not be a complete list of foods and beverages to avoid. Contact your dietitian for more information.   This information is not intended to replace advice given to you by your health care provider. Make sure you discuss any questions you have with your health care provider.   Document Released: 07/03/2001 Document Revised:  02/01/2014 Document Reviewed: 11/15/2012 Elsevier Interactive Patient Education [employer]2016 Elsevier Inc.

## 2015-07-09 NOTE — Progress Notes (Signed)
Pre visit review using our clinic review tool, if applicable. No additional management support is needed unless otherwise documented below in the visit note. 
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
## 2015-07-10 NOTE — Assessment & Plan Note (Signed)
Fatigue: Sx as described above, recent TSH normal. OSA?Marland Kitchen. Plan: Vitamin D, CBC, B12, folic acid. Refer to neurology, sleep study? Edema: Primarily peri-ankle edema at the end of the day, likely dependent. EKG today --normal sinus rhythm, no change compared to previous. Recommend low salt diet, leg elevation and consider compression stockings. Primary care: Last mammogram negative, patient is concerned about dense breast, recommend to see gynecology for a  exam including breast exam. Referral placed   RTC 3 months

## 2015-07-12 LAB — VITAMIN D 1,25 DIHYDROXY
VITAMIN D 1, 25 (OH) TOTAL: 26 pg/mL (ref 18–72)
Vitamin D2 1, 25 (OH)2: <8 pg/mL
Vitamin D3 1, 25 (OH)2: 26 pg/mL

## 2015-07-21 ENCOUNTER — Ambulatory Visit: Payer: BC Managed Care – PPO | Admitting: Internal Medicine

## 2015-07-23 ENCOUNTER — Encounter: Payer: Self-pay | Admitting: Internal Medicine

## 2015-07-23 ENCOUNTER — Ambulatory Visit (INDEPENDENT_AMBULATORY_CARE_PROVIDER_SITE_OTHER): Payer: BC Managed Care – PPO | Admitting: Internal Medicine

## 2015-07-23 DIAGNOSIS — E119 Type 2 diabetes mellitus without complications: Secondary | ICD-10-CM

## 2015-07-23 DIAGNOSIS — E210 Primary hyperparathyroidism: Secondary | ICD-10-CM

## 2015-07-23 LAB — POCT GLYCOSYLATED HEMOGLOBIN (HGB A1C): Hemoglobin A1C: 6.5

## 2015-07-23 MED ORDER — METFORMIN HCL ER 500 MG PO TB24: 500.0000 mg | ORAL_TABLET | Freq: Three times a day (TID) | ORAL | Status: DC

## 2015-07-23 NOTE — Progress Notes (Addendum)
Patient ID: Abigail [name], female   DOB: [birth year], 65 y.o.   MRN: [id number]  HPI: Abigail [name] is a 65 y.o.-year-old female, returning for f/u for DM2, dx in ~2010, non-insulin-dependent, controlled, without complications and h/o primary HPTH. Her husband, [name], is also a patient of mine and patient is usually accompanying him all his appointments. Last visit 2.5 mo ago.  Last hemoglobin A1c was: Lab Results  Component Value Date   HGBA1C 6.7* 05/06/2015   HGBA1C 7.0* 01/02/2015   Pt is on a regimen of: - Metformin ER 500 mg 1x a day - did not take it 3x a day (misunderstood instructions)  Pt checks her sugars 0-1x a day and they are: - am: 89-110 >> 99-131 - 2h after b'fast: n/c >> 147 - before lunch: n/c - 2h after lunch: n/c >> 117, 175 - before dinner: n/c - 2h after dinner: 120-135 >> 144 - bedtime: n/c >> 132 - nighttime: n/c No lows. Lowest sugar was 89; she has hypoglycemia awareness at 80s.  Highest sugar was 240x1 (dietary indiscretions).  Glucometer: ReliOn  Pt's meals are: - Breakfast: fruits, eggs - Lunch: chicken/fish + veggies - no red meat  - Dinner: veggies + fruit +/- chicken/fish - Snacks: cashews  - no CKD, last BUN/creatinine:  Lab Results  Component Value Date   BUN 16 07/09/2015   CREATININE 1.02 07/09/2015   - last set of lipids: Lab Results  Component Value Date   CHOL 169 05/06/2015   HDL 59.20 05/06/2015   LDLCALC 96 05/06/2015   TRIG 71.0 05/06/2015   CHOLHDL 3 05/06/2015   - last eye exam was in 11/2014. No DR.  - + numbness and tingling in her feet. Foot exam normal 05/06/2015.  She has a h/o primary hyperparathyroidism - s/p 2x parathyroid glands excised in 2015, with resolution of hypercalcemia.  She occasionally takes Tums x1 when feels weak - not every day.  Latest calcium levels normal: Lab Results  Component Value Date   CALCIUM 9.2 07/09/2015   CALCIUM 9.2 01/02/2015   CALCIUM 10.4  05/03/2006   ROS: Constitutional: no weight gain/loss, no fatigue, no subjective hyperthermia/hypothermia Eyes: no blurry vision, no xerophthalmia ENT: no sore throat, no nodules palpated in throat, + dysphagia/no odynophagia, no hoarseness Cardiovascular: no CP/SOB/palpitations/+ leg swelling Respiratory: no cough/SOB Gastrointestinal: no N/V/D/C/ + heartburn Musculoskeletal: no muscle/joint aches Skin: no rashes, + hair loss Neurological: no tremors/numbness/tingling/dizziness  I reviewed pt's medications, allergies, PMH, social hx, family hx, and changes were documented in the history of present illness. Otherwise, unchanged from my initial visit note.  Past Medical History  Diagnosis Date  . Diabetes mellitus   . Thyroid disease   . Chiari malformation type I (HCC)   . Sickle cell anemia (HCC)   . Lumbar radiculopathy   . Spondylolysis   . Postablative hypothyroidism   . Hyperparathyroidism (HCC)   . Hypertension   . GERD (gastroesophageal reflux disease)   . Asthma   . Hx of gout   . Hyperlipidemia    Past Surgical History  Procedure Laterality Date  . Parathyroidectomy  2015    [facility]   . Craniectomy suboccipital w/ cervical laminectomy / chiari  07/05/2011    At C1, performed at [facility]  . Cervical laminectomy      at C1 w/ duraplasty  . Abdominal hysterectomy  1996    NO oophorectomy   Social History   Social History  . Marital Status: Married  Spouse Name: N/A  . Number of Children: 1   Occupational History  . teacher - high school     Social History Main Topics  . Smoking status: Never Smoker   . Smokeless tobacco: Not on file  . Alcohol Use: No  . Drug Use: No   Social History Narrative   [place]   Married x 44 years   Current Outpatient Prescriptions on File Prior to Visit  Medication Sig Dispense Refill  . aspirin 81 MG tablet Take 81 mg by mouth daily.      . fexofenadine (ALLEGRA) 180 MG tablet Take 180 mg by mouth daily.     Marland Kitchen. glucose blood (ONE TOUCH ULTRA TEST) test strip Reported on 07/09/2015    . meloxicam (MOBIC) 15 MG tablet Take 1 tablet (15 mg total) by mouth daily. 90 tablet 0  . metFORMIN (GLUCOPHAGE-XR) 500 MG 24 hr tablet Take 1 tablet (500 mg total) by mouth daily with breakfast. 90 tablet 5  . Biotin 5000 MCG TABS Take 1 tablet by mouth daily. Reported on 07/23/2015    . calcium citrate-vitamin D (CITRACAL+D) 315-200 MG-UNIT per tablet Take 1 tablet by mouth 2 (two) times daily. Reported on 07/23/2015    . calcium elemental as carbonate (BARIATRIC TUMS ULTRA) 400 MG chewable tablet Chew 1,000 mg by mouth as needed for heartburn. Reported on 07/23/2015     No current facility-administered medications on file prior to visit.   Allergies  Allergen Reactions  . Citrus Itching, Rash and Swelling  . Ibuprofen Swelling and Rash    Pt now states that she can not take ibuprofen  . Other Itching, Rash and Swelling    seafood. seafood   Family History  Problem Relation Age of Onset  . Colon cancer Neg Hx   . Sudden death Neg Hx   . Heart attack Neg Hx   . Breast cancer Neg Hx   . Hypertension Sister     sister, daughter , mother   . Hyperlipidemia Sister   . Diabetes Daughter   . Stroke Mother    PE: BP 128/78 mmHg  Pulse 75  SpO2 98% Wt Readings from Last 3 Encounters:  07/09/15 181 lb 6 oz (82.271 kg)  05/06/15 182 lb (82.555 kg)  05/05/15 180 lb 8 oz (81.874 kg)   Constitutional: overweight, in NAD Eyes: PERRLA, EOMI, no exophthalmos ENT: moist mucous membranes, no thyromegaly, no cervical lymphadenopathy Cardiovascular: RRR, No MRG Respiratory: CTA B Gastrointestinal: abdomen soft, NT, ND, BS+ Musculoskeletal: no deformities, strength intact in all 4 Skin: moist, warm, no rashes Neurological: no tremor with outstretched hands, DTR normal in all 4  ASSESSMENT: 1. DM2, non-insulin-dependent, uncontrolled, without complications  2. History of hyperparathyroidism -  status post 2  gland parathyroidectomy in 2015   PLAN:  1. Patient with uncontrolled diabetes, on oral antidiabetic regimen, now with much improved control - She was having some GI symptoms with her regular metformin, so we will switched to metformin extended-release 500 mg 3 times a day. Since last visit, she reduced the dose to just 1 tab a day as she misunderstood instructions.  - I suggested to:  Patient Instructions  Please increase Metformin ER to 500 mg 2-3x a day.   Please return in 3 months with your sugar log.   - continue checking sugars at different times of the day - check once a day, rotating checks - advised for yearly eye exams  - checked HbA1c >> 6.5% (better!) -  Return to clinic in 3 mo with sugar log   2. History of primary hyperparathyroidism - S/p parathyroidectomy >> recent normal calcium levels - [place] sign negative bilaterally - On prn 1xTums when feeling tired or with weakness in legs.

## 2015-07-23 NOTE — Addendum Note (Signed)
Addended by: Darene LamerHOMPSON, Cythia Bachtel T on: 07/23/2015 09:37 AM   Modules accepted: Orders

## 2015-07-23 NOTE — Patient Instructions (Signed)
Please increase Metformin ER to 500 mg 2-3x a day.   Please return in 3 months with your sugar log.

## 2015-08-07 ENCOUNTER — Institutional Professional Consult (permissible substitution): Payer: BC Managed Care – PPO | Admitting: Neurology

## 2015-08-08 ENCOUNTER — Ambulatory Visit: Payer: BC Managed Care – PPO | Admitting: Gynecology

## 2015-08-27 ENCOUNTER — Ambulatory Visit (INDEPENDENT_AMBULATORY_CARE_PROVIDER_SITE_OTHER): Payer: BC Managed Care – PPO | Admitting: Neurology

## 2015-08-27 ENCOUNTER — Ambulatory Visit (INDEPENDENT_AMBULATORY_CARE_PROVIDER_SITE_OTHER): Payer: BC Managed Care – PPO | Admitting: Gynecology

## 2015-08-27 ENCOUNTER — Encounter: Payer: Self-pay | Admitting: Neurology

## 2015-08-27 ENCOUNTER — Encounter: Payer: Self-pay | Admitting: Gynecology

## 2015-08-27 ENCOUNTER — Telehealth: Payer: Self-pay | Admitting: Neurology

## 2015-08-27 VITALS — BP 120/70 | HR 64 | Resp 20 | Ht 67.0 in | Wt 184.0 lb

## 2015-08-27 DIAGNOSIS — Z01419 Encounter for gynecological examination (general) (routine) without abnormal findings: Secondary | ICD-10-CM

## 2015-08-27 DIAGNOSIS — Z0000 Encounter for general adult medical examination without abnormal findings: Secondary | ICD-10-CM | POA: Insufficient documentation

## 2015-08-27 DIAGNOSIS — R5383 Other fatigue: Secondary | ICD-10-CM | POA: Diagnosis not present

## 2015-08-27 DIAGNOSIS — F329 Major depressive disorder, single episode, unspecified: Secondary | ICD-10-CM

## 2015-08-27 DIAGNOSIS — Z780 Asymptomatic menopausal state: Secondary | ICD-10-CM

## 2015-08-27 MED ORDER — MELATONIN 3 MG PO CAPS: ORAL_CAPSULE | ORAL | 0 refills | Status: DC

## 2015-08-27 NOTE — Patient Instructions (Signed)
Autoexamen de mamas  (Breast Self-Awareness) El autoexamen de mamas puede detectar problemas de manera temprana, prevenir complicaciones mdicas significativas y posiblemente salvar su vida. Al hacerlo, podr familiarizarse con el aspecto y forma de sus mamas, y observar cambios. Esto le permite descubrir cambios de manera precoz. Este autoexamen mensual le ofrece la tranquilidad de que sus senos estn en buen estado de salud. Una forma de aprender qu es normal para sus mamas y si sufren modificaciones es hacer un autoexamen.   Si encuentra un bulto o algo que no estaba presente anteriormente, lo mejor es ponerse en contacto con su mdico inmediatamente. Otro hallazgo que debe ser evaluado por su mdico es la secrecin del pezn, especialmente si es con sangre; cambios en la piel o enrojecimiento; reas donde la piel parece estar tironeada (retrada) o nuevos bultos o protuberancias. El dolor en los senos es rara vez se asocia con el cncer (malignidad), pero tambin debe ser evaluado por un mdico.  CMO REALIZAR EL AUTOEXAMEN DE MAMAS  El mejor momento para examinar sus mamas es a los 5 a 7 das despus de finalizado el perodo menstrual. Durante la menstruacin, las mamas estn ms abultadas y puede haber ms dificultad para encontrar modificaciones. Si no menstra, ha llegado a la menopausia, o le han extirpado el tero (histerectomia), usted debe examinar sus senos a intervalos regulares, por ejemplo cada mes. Si est amamantando, examine sus senos despus de alimentar al beb o despus de usar un extractor de leche. Los implantes mamarios no disminuyen el riesgo de bultos o tumores, por lo que debe seguir realizando el autoexamen de mamas como se recomienda. Hable con su mdico acerca de cmo determinar la diferencia entre el implante y el tejido mamario. Adems, debe consultar cuanta presin debe hacer durante el examen. Con el tiempo se familiarizar con las variaciones de las mamas y se sentir ms  cmoda para realizar el examen. Para el autoexamen deber quitarse toda la ropa de la cintura para arriba.  1.  Observe sus senos y pezones. Prese frente a un espejo en una habitacin con buena iluminacin. Con las manos en las caderas, presione las manos firmemente hacia abajo. Busque diferencias en la forma, el contorno y el tamao de un pecho al otro (asimetras). Entre las asimetras se incluyen arrugas, depresiones o protuberancias. Tambin, busque cambios en la piel, como reas enrojecidas o escamosas. Busque cambios en los pezones, como secreciones, hoyuelos, cambios en la posicin, o enrojecimiento. 2. Palpe cuidadosamente sus senos. Es mucho mejor hacerlo en la ducha o en la baera, mientras usa jabn o cuando est recostada sobre su espalda. Coloque el brazo (en el lado de la mama que se examina) por arriba de la cabeza. Use las yemas (no las puntas) de los tres dedos centrales de la mano opuesta para palpar. Comience en la zona de la axila, haga crculos de  de pulgada (2 cm) y vaya superponindolos. Utilice 3 niveles diferentes de presin (ligero, medio y firme) en cada crculo antes de pasar al siguiente. Se necesita una presin ligera para sentir los tejidos ms cercanos a la piel. La presin media ayudar a sentir el tejido mamario un poco ms profundo, mientras que se necesita una presin firme para palpar el tejido que se encuentra cerca de las costillas. Continuar superponiendo crculos y vaya hacia abajo, hasta sentir las costillas, por debajo del pecho. Luego mueva un espacio del ancho de un dedo hacia el centro del cuerpo. Siga con los crculos del  de pulgada (  2 cm) mientras va lentamente hacia la clavcula, cerca de la base del cuello. Contine con el examen hacia arriba y hacia abajo con las 3 intensidades de presin hasta llegar a la mitad del pecho. Hgalo con cada seno cuidadosamente, buscando bultos o modificaciones. 3. Debe llevar un registro escrito con los cambios o los hallazgos  normales que encuentre para cada seno. Si registra esta informacin, no tiene que depender slo de la memoria para recordar el tamao, la sensibilidad o la ubicacin de los hallazgos. Anote en qu momento se encuentra del ciclo menstrual, si usted todava est menstruando. El tejido mamario puede tener algunos bultos o tejidos engrosados. Sin embargo, consulte a su mdico si usted encuentra algo que le preocupa.   SOLICITE ATENCIN MDICA SI:   Observa cambios en la forma, en el contorno o el tamao de las mamas o los pezones.   Hay modificaciones en la piel, como zonas enrojecidas o escamosas en las mamas o en los pezones.   Tiene una secrecin anormal en los pezones.   Siente un nuevo bulto o reas engrosadas de manera anormal.    Esta informacin no tiene como fin reemplazar el consejo del mdico. Asegrese de hacerle al mdico cualquier pregunta que tenga.   Document Released: 01/11/2005 Document Revised: 12/29/2011 Elsevier Interactive Patient Education 2016 Elsevier Inc.  

## 2015-08-27 NOTE — Progress Notes (Signed)
Abigail [name] 03-Jul-1950 [id number]   History:    65 y.o.  for annual gyn exam who was referred to our office as a courtesy of her PCP Dr. Drue Novel for a full GYN exam that she has not had in several years. Patient stated her last Pap smear was in 2015. She states she has always had normal Pap smears. She had a normal bone density this year and a normal colonoscopy in 2014. While she was in her 37s as a result of endometriosis she had a total abdominal hysterectomy with ovarian conservation. Shortly thereafter she had been on hormone replacement therapy but for very short time. She has no vasomotor symptoms and she has no complaints today. Also in 2011 patient had a left breast biopsy which fibrocystic disease was the interpretation.  Past medical history,surgical history, family history and social history were all reviewed and documented in the EPIC chart.  Gynecologic History No LMP recorded. Patient has had a hysterectomy. Contraception: status post hysterectomy Last Pap: 2015. Results were: normal Last mammogram: 2017. Results were: normal  Obstetric History OB History  Gravida Para Term Preterm AB Living  1 1       1   SAB TAB Ectopic Multiple Live Births          1    # Outcome Date GA Lbr Len/2nd Weight Sex Delivery Anes PTL Lv  1 Para     F Vag-Spont          ROS: A ROS was performed and pertinent positives and negatives are included in the history.  GENERAL: No fevers or chills. HEENT: No change in vision, no earache, sore throat or sinus congestion. NECK: No pain or stiffness. CARDIOVASCULAR: No chest pain or pressure. No palpitations. PULMONARY: No shortness of breath, cough or wheeze. GASTROINTESTINAL: No abdominal pain, nausea, vomiting or diarrhea, melena or bright red blood per rectum. GENITOURINARY: No urinary frequency, urgency, hesitancy or dysuria. MUSCULOSKELETAL: No joint or muscle pain, no back pain, no recent trauma. DERMATOLOGIC: No rash, no itching, no  lesions. ENDOCRINE: No polyuria, polydipsia, no heat or cold intolerance. No recent change in weight. HEMATOLOGICAL: No anemia or easy bruising or bleeding. NEUROLOGIC: No headache, seizures, numbness, tingling or weakness. PSYCHIATRIC: No depression, no loss of interest in normal activity or change in sleep pattern.     Exam: chaperone present  BP 134/82   Ht 6' (1.829 m)   Wt 183 lb (83 kg)   BMI 24.82 kg/m   Body mass index is 24.82 kg/m.  General appearance : Well developed well nourished female. No acute distress HEENT: Eyes: no retinal hemorrhage or exudates,  Neck supple, trachea midline, no carotid bruits, no thyroidmegaly Lungs: Clear to auscultation, no rhonchi or wheezes, or rib retractions  Heart: Regular rate and rhythm, no murmurs or gallops Breast:Examined in sitting and supine position were symmetrical in appearance, no palpable masses or tenderness,  no skin retraction, no nipple inversion, no nipple discharge, no skin discoloration, no axillary or supraclavicular lymphadenopathy Abdomen: no palpable masses or tenderness, no rebound or guarding Extremities: no edema or skin discoloration or tenderness  Pelvic:  Bartholin, Urethra, Skene Glands: Within normal limits             Vagina: No gross lesions or discharge  Cervix: Absent  Uterus absent  Adnexa  Without masses or tenderness  Anus and perineum  normal   Rectovaginal  normal sphincter tone without palpated masses or tenderness  Hemoccult cards will be provided     Assessment/Plan:  65 y.o. female for annual exam who was reminded on the importance of monthly breast exams. Since we do not have a report of her last Pap smear we will do a Pap smear with HPV screening today and then adhere to the new guidelines in which she will no longer need Pap smears that she's had a hysterectomy and no prior history of dysplasia. Her PCP has been doing her blood work. We discussed importance of calcium vitamin D  and regular exercise for osteoporosis prevention.   Ok Edwards MD, 10:09 AM 08/27/2015

## 2015-08-27 NOTE — Progress Notes (Signed)
SLEEP MEDICINE CLINIC   Provider:  Larey Seat, M D  Referring Provider: Colon Branch, MD Primary Care Physician:  Kathlene November, MD  Chief Complaint  Patient presents with  . New Patient (Initial Visit)    snores sometimes, fatigued during day    HPI:  Abigail [name] is a 65 y.o. female , seen here as a referral from Dr. Larose Kells for a sleep evaluation.   Abigail [name] is the main caretaker of her husband , who suffers from uncontrolled diabetes and progressive dementia and now requieres supervision and assistance in many AOLs.   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 She reports that after a usual day at work she prepares all meals does all the housekeeping and usually is in bed not earlier than midnight only to rise again at 4 AM and preparing her breakfast and setting the table etc. for her husband before she leaves for work. He stays home all day. He stays home alone. It will take her hours to get her husband into the shower, to get him dressed to get him motivated which is coached to do any activity and not stay in bed. She is afraid of the school year starting again as she doesn't know what to do about her husband's passivity. He is also very easily confused and distracted and he gets lost within the confines of his own home. In short, I think Abigail [name] is sufficiently explained by the social situation. The dementia has advanced very fast unexpectedly fast. His diabetes treatment also recently changed his insurance changed the available medication from insulin Lantus to another medication to which she seems not to respond very well.  I have suggested an adult center for enrichment or other adult day care facility and apparently the couple has visited the one on [address] but Mr. Feliciana Rossetti was not very sure that he  wants to be there. And his wife also agrees that it was a depressing visit.   Chief complaint according to patient : "I am overwhelmed "  Sleep habits are as follows: "I can only sleep between from  midnight and 4.30 AM, and during the day I prepare and supervise meals, make my husband change clothes several times a day , because he stains and drools". Her bedroom is comfortable, cool, quiet and dark. She does not watch TV. He sleeps was only one pillow usually sleeps on her side, she may have one bathroom break at night depending on her fluid intake during the evening. She wakes up spontaneously at 4 AM and starts preparing the house. She will drink coffee in the morning and drinks no other forms of caffeine containing beverages. She is a no tobacco user, she does not drink alcohol.  Sleep medical history and family sleep history:   Social history: married to a retired Education officer, museum from [place]. He is a Therapist, music in [employer]. Sickle cell trait. One daughter age 1 . 75 years of marriage.  Master's degree , was working on PhD.   Review of Systems: Out of a complete 14 system review, the patient complains of only the following symptoms, and all other reviewed systems are negative. Her husband a dry though reported that she sometimes snores, we have no more reliable account of her sleeping. She does not  wake up gasping for air she is usually not having palpitations she's not diaphoretic she does not have frequent nocturia and external circumstances limit her ability to sleep.   Epworth score  7, [name] severity score 25  , depression score 3/15    Social History   Social History  . Marital status: Married    Spouse name: N/A  . Number of children: 1  . Years of education: N/A   Occupational History  . teacher     Social History Main Topics  . Smoking status: Never Smoker  . Smokeless tobacco: Never Used  . Alcohol use No  . Drug use: No  . Sexual  activity: Yes   Other Topics Concern  . Not on file   Social History Narrative   Original from [place]   Married x 44 years    Family History  Problem Relation Age of Onset  . Hypertension Sister     sister, daughter , mother   . Hyperlipidemia Sister   . Thyroid disease Sister   . Diabetes Daughter   . Stroke Mother   . Colon cancer Neg Hx   . Sudden death Neg Hx   . Heart attack Neg Hx   . Breast cancer Neg Hx     Past Medical History:  Diagnosis Date  . Asthma   . Chiari malformation type I ([place])   . Diabetes mellitus   . GERD (gastroesophageal reflux disease)   . Hx of gout   . Hyperlipidemia   . Hyperparathyroidism ([place])   . Hypertension   . Lumbar radiculopathy   . Postablative hypothyroidism   . Sickle cell anemia ([facility])   . Spondylolysis   . Thyroid disease     Past Surgical History:  Procedure Laterality Date  . ABDOMINAL HYSTERECTOMY  1996   NO oophorectomy  . CERVICAL LAMINECTOMY     at C1 w/ duraplasty  . CRANIECTOMY SUBOCCIPITAL W/ CERVICAL LAMINECTOMY / CHIARI  07/05/2011   At C1, performed at [facility]  . [facility]Y  2015   [facility]     Current Outpatient Prescriptions  Medication Sig Dispense Refill  . aspirin 81 MG tablet Take 81 mg by mouth daily.      . calcium elemental as carbonate (BARIATRIC TUMS ULTRA) 400 MG chewable tablet Chew 1,000 mg by mouth as needed for heartburn. Reported on 07/23/2015    . fexofenadine (ALLEGRA) 180 MG tablet Take 180 mg by mouth daily.    Marland Kitchen glucose blood (ONE TOUCH ULTRA TEST) test strip Reported on 07/09/2015    . meloxicam (MOBIC) 15 MG tablet Take 1 tablet (15 mg total) by mouth daily. 90 tablet 0  . metFORMIN (GLUCOPHAGE-XR) 500 MG 24 hr tablet Take 1 tablet (500 mg total) by mouth 3 (three) times daily with meals. 90 tablet 5   No current facility-administered medications for this visit.     Allergies as of 08/27/2015 - Review Complete 08/27/2015  Allergen Reaction Noted  . Citrus Itching, Rash, and  Swelling 12/06/2014  . Ibuprofen Swelling and Rash 11/12/2010  . Other Itching, Rash, and Swelling 12/06/2014    Vitals: BP 120/70   Pulse 64   Resp 20   Ht _0  (1.702 m)   Wt 184 lb (83.5 kg)   BMI 28.82 kg/m  Last Weight:  Wt Readings from Last 1 Encounters:  08/27/15 184 lb (83.5 kg)   VHQ:IONG mass index is 28.82 kg/m.     Last Height:   Ht Readings from  Last 1 Encounters:  08/27/15 _0  (1.702 m)    Physical exam:  General: The patient is awake, alert and appears not in acute distress. The patient is well groomed. Head: Normocephalic, atraumatic. Neck is supple. Mallampati 1,  neck circumference:14.75 . Nasal airflow patent  Cardiovascular:  Regular rate and rhythm , without  murmurs or carotid bruit, and without distended neck veins. Respiratory: Lungs are clear to auscultation. Skin:  Without evidence of edema, or rash Trunk: BMI 29. The patient's posture is erect   Neurologic exam : The patient is awake and alert, oriented to place and time.   Attention span & concentration ability appears normal.  Speech is fluent,  without dysarthria, dysphonia or aphasia.  Mood and affect are appropriate.  Cranial nerves: Pupils are equal and briskly reactive to light. Funduscopic exam without  evidence of pallor or edema.  Extraocular movements  in vertical and horizontal planes intact and without nystagmus. Visual fields by finger perimetry are intact. Hearing to finger rub intact. Facial sensation intact to fine touch.Facial motor strength is symmetric and tongue and uvula move midline. Shoulder shrug was symmetrical.   Motor exam:   Normal tone, muscle bulk and symmetric strength in all extremities.  Sensory:  Fine touch, pinprick and vibration were tested in all extremities. Proprioception tested in the upper extremities was normal.  Coordination: Rapid alternating movements in the fingers/hands was normal. Finger-to-nose maneuver without evidence of ataxia, dysmetria  or tremor.  Gait and station: Patient walks without assistive device and is able unassisted to climb up to the exam table. Strength within normal limits.  Stance is stable and normal.  Toe and hell stand were tested .Tandem gait is unfragmented. Turns with 3  Steps. Romberg testing is negative.  Deep tendon reflexes: in the upper and lower extremities are symmetric and intact. Babinski maneuver response is downgoing.  The patient was advised of the nature of the diagnosed sleep disorder , the treatment options and risks for general a health and wellness arising from not treating the condition.  I spent more than 45  minutes of face to face time with the patient. Greater than 50% of time was spent in counseling and coordination of care. We have discussed the diagnosis and differential and I answered the patient's questions.     Assessment:  After physical and neurologic examination, review of laboratory studies,  Personal review of imaging studies, reports of other /same  Imaging studies ,  Results of polysomnography/ neurophysiology testing and pre-existing records as far as provided in visit., my assessment is   1) Abigail [name] is seen here today for the first time is a patient I usually see her husband. She does not have primary neurologic concerns it is her social situation and the illness of her husband that preoccupies most of her time and affects her sleep. She is known to sometimes snore but she has no concerns about apnea him a poorly controlled hypertension, she is not diabetic.  2) I will not order a sleep study for the patient, but help her to arrange for her husband adult daycare in a facility as close to home as possible. [facility] as well as [facility] in [place] on [address]. I think that she will be much happier with the [facility] or [facility], and that her husband. Feel that this is a more adequate institution. I  strongly encourage her to have either help in the  home or to make arrangements for her husband to visit adult daycare 5 days a week. Medicare will pay part of these fees if the primary care physician writes a note of support usually it for care for 2 or 3 days a week the rest has to come out-of-pocket.  Plan:  Treatment plan and additional workup :  Patient will see me after her caretaker needs have been met, allowing her to find time for her own well being.    Asencion Partridge Hailie Searight MD  08/27/2015   CC: Colon Branch, [address] [place], [place] [address]

## 2015-08-27 NOTE — Patient Instructions (Addendum)
Dear Dr.Paz ,   I saw your patient, Abigail [name], upon your kind request in my neurologic clinic today for initial consultation of fatigue and excessive daytime sleepiness.   Alzheimer Disease Caregiver Guide Alzheimer disease is an illness that affects a person's brain. It causes a person to lose the ability to remember things and make good decisions. As the disease progresses, the person is unable to take care of himself or herself and needs more and more help to do simple tasks. Taking care of someone with Alzheimer disease can be very challenging and overwhelming.  MEMORY LOSS AND CONFUSION Memory loss and confusion is mild in the beginning stages of the disease. Both of these problems become more severe as the disease progresses. Eventually, the person will not recognize places or even close family members and friends.   Stay calm.  Respond with a short explanation. Long explanations can be overwhelming and confusing.  Avoid corrections that sound like scolding.  Try not to take it personally, even if the person forgets your name. BEHAVIOR CHANGES Behavior changes are part of the disease. The person may develop depression, anxiety, anger, hallucinations, or other behavior changes. These changes can come on suddenly and may be in response to pain, infection, changes in the environment (temperature, noise), overstimulation, or feeling lost or scared.   Try not to take behavior changes personally.  Remain calm and patient.  Do not argue or try to convince the person about a specific point. This will only make him or her more agitated.  Know that the behavior changes are part of the disease process and try to work through it. TIPS TO REDUCE FRUSTRATION  Schedule wisely by making appointments and doing daily tasks, like bathing and dressing, when the person is at his or her best.  Take your time. Simple tasks may take a lot longer, so be sure to allow for plenty of  time.  Limit choices. Too many choices can be overwhelming and stressful for the person.  Involve the person in what you are doing.  Stick to a routine.  Avoid new or crowded situations, if possible.  Use simple words, short sentences, and a calm voice. Only give one direction at a time.  Buy clothes and shoes that are easy to put on and take off.  Let people help if they offer. HOME SAFETY Keeping the home safe is very important to reduce the risk of falls and injuries.   Keep floors clear of clutter. Remove rugs, magazine racks, and floor lamps.  Keep hallways well lit.  Put a handrail and nonslip mat in the bathtub or shower.  Put childproof locks on cabinets with dangerous items, such as medicine, alcohol, guns, toxic cleaning items, sharp tools or utensils, matches, or lighters.  Place locks on doors where the person cannot easily see or reach them. This helps ensure that the person cannot wander out of the house and get lost.  Be prepared for emergencies. Keep a list of emergency phone numbers and addresses in a convenient area. PLANS FOR THE FUTURE  Do not put off talking about finances.  Talk about money management. People with Alzheimer disease have trouble managing their money as the disease gets worse.  Get help from professional advisors regarding financial and legal matters.  Do not put off talking about future care.  Choose a power of attorney. This is someone who can make decisions for the person with Alzheimer disease when he or she is  no longer able to do so.  Talk about driving and when it is the right time to stop. The person's health care provider can help give advice on this matter.  Talk about the person's living situation. If he or she lives alone, you need to make sure he or she is safe. Some people need extra help at home, and others need more care at a nursing home or care center. SUPPORT GROUPS Joining a support group can be very helpful for  caregivers of people with Alzheimer disease. Some advantages to being part of a support group include:   Getting strategies to manage stress.  Sharing experiences with others.  Receiving emotional comfort and support.  Learning new caregiving skills as the disease progresses.  Knowing what community resources are available and taking advantage of them. SEEK MEDICAL CARE IF:  The person has a fever.  The person has a sudden change in behavior that does not improve with calming strategies.  The person is unable to manage in his or her current living situation.  The person threatens you or anyone else, including himself or herself.  You are no longer able to care for the person.   This information is not intended to replace advice given to you by your health care provider. Make sure you discuss any questions you have with your health care provider.   Document Released: 09/23/2003 Document Revised: 02/01/2014 Document Reviewed: 02/17/2011 Elsevier Interactive Patient Education [employer]

## 2015-08-28 LAB — PAP, TP IMAGING W/ HPV RNA, RFLX HPV TYPE 16,18/45: HPV mRNA, High Risk: NOT DETECTED

## 2015-08-29 ENCOUNTER — Telehealth: Payer: Self-pay | Admitting: Internal Medicine

## 2015-08-29 NOTE — Telephone Encounter (Signed)
She can reduce it to only one tablet at dinnertime and discussed with Dr. Reece Agar next week

## 2015-08-29 NOTE — Telephone Encounter (Signed)
Patient do not feel good think metformin is causing her to feel bad. B./s is 52  Please advise

## 2015-08-29 NOTE — Telephone Encounter (Signed)
Agree with the decrease. Please let us know about the sugars and how she feels after the decrease.

## 2015-08-29 NOTE — Telephone Encounter (Signed)
Called and spoke with patient about lowering the dosage to one tablet at dinnertime. Advised patient we would discuss with Dr.Gherghe when she returns next week. No other questions or concerns.

## 2015-10-14 ENCOUNTER — Ambulatory Visit: Payer: BC Managed Care – PPO | Admitting: Internal Medicine

## 2015-10-23 ENCOUNTER — Encounter: Payer: Self-pay | Admitting: Internal Medicine

## 2015-10-23 ENCOUNTER — Ambulatory Visit (INDEPENDENT_AMBULATORY_CARE_PROVIDER_SITE_OTHER): Payer: BC Managed Care – PPO | Admitting: Internal Medicine

## 2015-10-23 VITALS — BP 122/67 | HR 75 | Wt 182.0 lb

## 2015-10-23 DIAGNOSIS — E210 Primary hyperparathyroidism: Secondary | ICD-10-CM

## 2015-10-23 DIAGNOSIS — E119 Type 2 diabetes mellitus without complications: Secondary | ICD-10-CM | POA: Diagnosis not present

## 2015-10-23 DIAGNOSIS — Z23 Encounter for immunization: Secondary | ICD-10-CM | POA: Diagnosis not present

## 2015-10-23 LAB — POCT GLYCOSYLATED HEMOGLOBIN (HGB A1C): Hemoglobin A1C: 6.5

## 2015-10-23 NOTE — Addendum Note (Signed)
Addended by: Bobbye RiggsWALKER, Miyako Oelke L on: 10/23/2015 05:15 PM   Modules accepted: Orders

## 2015-10-23 NOTE — Patient Instructions (Addendum)
Please continue Metformin ER 500 mg 2x a day.   Please return in 3 months with your sugar log.

## 2015-10-23 NOTE — Progress Notes (Signed)
Patient ID: Graciella Arment, female   DOB: 03-May-1950, 65 y.o.   MRN: [id number]  HPI: Hannie Shoe is a 65 y.o.-year-old female, returning for f/u for DM2, dx in ~2010, non-insulin-dependent, controlled, without complications and h/o primary HPTH. Her husband, [name], is also a patient of mine and patient is usually accompanying him all his appointments. Last visit 2.5 mo ago.  Last hemoglobin A1c was: Lab Results  Component Value Date   HGBA1C 6.5 07/23/2015   HGBA1C 6.7 (H) 05/06/2015   HGBA1C 7.0 (H) 01/02/2015   Pt is on a regimen of: - Metformin ER 500 mg 1x a day - could not take it 3x a day b/c lows >> now 2x a day  Pt checks her sugars 0-1x a day and they are: - am: 89-110 >> 99-131 >> 98-111 - 2h after b'fast: n/c >> 147 >> n/c - before lunch: n/c - 2h after lunch: n/c >> 117, 175 >> 140s - before dinner: n/c >> 90-130 - 2h after dinner: 120-135 >> 144 >> n/c  - bedtime: n/c >> 132 >> n/c - nighttime: n/c No lows. Lowest sugar was 89 >> 52 (when on 3 Metformin tabs a day); she has hypoglycemia awareness at 80s.  Highest sugar was 240x1 >> 252 (dietary indiscretions).  Glucometer: ReliOn  Pt's meals are: - Breakfast: fruits, eggs - Lunch: chicken/fish + veggies - no red meat  - Dinner: veggies + fruit +/- chicken/fish - Snacks: cashews  - no CKD, last BUN/creatinine:  Lab Results  Component Value Date   BUN 16 07/09/2015   CREATININE 1.02 07/09/2015   - last set of lipids: Lab Results  Component Value Date   CHOL 169 05/06/2015   HDL 59.20 05/06/2015   LDLCALC 96 05/06/2015   TRIG 71.0 05/06/2015   CHOLHDL 3 05/06/2015   - last eye exam was in 11/2014. No DR.  - + numbness and tingling in her feet. Foot exam normal 05/06/2015.  She has a h/o primary hyperparathyroidism - s/p 2x parathyroid glands excised in 2015, with resolution of hypercalcemia.  She occasionally takes Tums x1 when feels weak - not every day.  Latest calcium  levels normal: Lab Results  Component Value Date   CALCIUM 9.2 07/09/2015   CALCIUM 9.2 01/02/2015   CALCIUM 10.4 05/03/2006   ROS: Constitutional: no weight gain/loss, no fatigue, no subjective hyperthermia/hypothermia Eyes: no blurry vision, no xerophthalmia ENT: no sore throat, no nodules palpated in throat, + dysphagia/no odynophagia, no hoarseness Cardiovascular: no CP/SOB/palpitations/+ leg swelling Respiratory: no cough/SOB Gastrointestinal: no N/V/D/C/ + heartburn Musculoskeletal: no muscle/joint aches Skin: no rashes, no hair loss Neurological: no tremors/numbness/tingling/dizziness  I reviewed pt's medications, allergies, PMH, social hx, family hx, and changes were documented in the history of present illness. Otherwise, unchanged from my initial visit note.  Past Medical History:  Diagnosis Date  . Asthma   . Chiari malformation type I ([facility])   . Diabetes mellitus   . GERD (gastroesophageal reflux disease)   . Hx of gout   . Hyperlipidemia   . Hyperparathyroidism ([facility])   . Hypertension   . Lumbar radiculopathy   . Postablative hypothyroidism   . Sickle cell anemia ([facility])   . Spondylolysis   . Thyroid disease    Past Surgical History:  Procedure Laterality Date  . ABDOMINAL HYSTERECTOMY  1996   NO oophorectomy  . CERVICAL LAMINECTOMY     at C1 w/ duraplasty  . CRANIECTOMY SUBOCCIPITAL W/ CERVICAL LAMINECTOMY / CHIARI  07/05/2011   At C1, [facility] at [facility]  . [facility]OMY  2015   [facility]    Social History   Social History  . Marital Status: Married    Spouse Name: N/A  . Number of Children: 1   Occupational History  . teacher - high school     Social History Main Topics  . Smoking status: Never Smoker   . Smokeless tobacco: Not on file  . Alcohol Use: No  . Drug Use: No   Social History Narrative   Original from [place]   Married x 44 years   Current Outpatient Prescriptions on File Prior to Visit  Medication Sig Dispense Refill  .  aspirin 81 MG tablet Take 81 mg by mouth daily.      . calcium elemental as carbonate (BARIATRIC TUMS ULTRA) 400 MG chewable tablet Chew 1,000 mg by mouth as needed for heartburn. Reported on 07/23/2015    . fexofenadine (ALLEGRA) 180 MG tablet Take 180 mg by mouth daily.    Marland Kitchen glucose blood (ONE TOUCH ULTRA TEST) test strip Reported on 07/09/2015    . meloxicam (MOBIC) 15 MG tablet Take 1 tablet (15 mg total) by mouth daily. 90 tablet 0  . metFORMIN (GLUCOPHAGE-XR) 500 MG 24 hr tablet Take 1 tablet (500 mg total) by mouth 3 (three) times daily with meals. 90 tablet 5  . Melatonin 3 MG CAPS Take prn one half hour before itended bed time. (Patient not taking: Reported on 10/23/2015) 90 capsule 0   No current facility-administered medications on file prior to visit.    Allergies  Allergen Reactions  . Citrus Itching, Rash and Swelling  . Ibuprofen Swelling and Rash    Pt now states that she can not take ibuprofen  . Other Itching, Rash and Swelling    seafood. seafood   Family History  Problem Relation Age of Onset  . Hypertension Sister     sister, daughter , mother   . Hyperlipidemia Sister   . Thyroid disease Sister   . Diabetes Daughter   . Stroke Mother   . Colon cancer Neg Hx   . Sudden death Neg Hx   . Heart attack Neg Hx   . Breast cancer Neg Hx    PE: BP 122/67   Pulse 75   Wt 182 lb (82.6 kg)   BMI 28.51 kg/m  Wt Readings from Last 3 Encounters:  10/23/15 182 lb (82.6 kg)  08/27/15 184 lb (83.5 kg)  08/27/15 183 lb (83 kg)   Constitutional: overweight, in NAD Eyes: PERRLA, EOMI, no exophthalmos ENT: moist mucous membranes, no thyromegaly, no cervical lymphadenopathy Cardiovascular: RRR, No MRG Respiratory: CTA B Gastrointestinal: abdomen soft, NT, ND, BS+ Musculoskeletal: no deformities, strength intact in all 4 Skin: moist, warm, no rashes Neurological: no tremor with outstretched hands, DTR normal in all 4  ASSESSMENT: 1. DM2, non-insulin-dependent,  uncontrolled, without complications  2. History of hyperparathyroidism -  status post 2 gland parathyroidectomy in 2015   PLAN:  1. Patient with uncontrolled diabetes, on oral antidiabetic regimen, now with great control - She was having some GI symptoms with her regular metformin, so we switched to metformin extended-release 500 mg >> now on 2x a day as she had a low on the 3x a day regimen. - I suggested to:  Patient Instructions  Please continue Metformin ER 500 mg 2x a day.   Please return in 3 months with your sugar log.   - continue checking sugars at different  times of the day - check once a day, rotating checks - advised for yearly eye exams >> she is UTD - will give flu shot today - checked HbA1c >> 6.5% (better!) - Return to clinic in 3 mo with sugar log   2. History of primary hyperparathyroidism - S/p parathyroidectomy >> recent normal calcium levels - [place] sign negative bilaterally - On prn 1xTums when feeling tired or with weakness in legs.   Carlus Pavlovristina Nahomi Hegner, MD PhD [facility]

## 2015-12-03 ENCOUNTER — Telehealth: Payer: Self-pay | Admitting: *Deleted

## 2015-12-03 ENCOUNTER — Ambulatory Visit (INDEPENDENT_AMBULATORY_CARE_PROVIDER_SITE_OTHER): Payer: BC Managed Care – PPO | Admitting: Family Medicine

## 2015-12-03 ENCOUNTER — Encounter: Payer: Self-pay | Admitting: Family Medicine

## 2015-12-03 VITALS — BP 150/72 | HR 79 | Temp 98.0°F | Ht 67.0 in | Wt 189.4 lb

## 2015-12-03 DIAGNOSIS — G44209 Tension-type headache, unspecified, not intractable: Secondary | ICD-10-CM | POA: Diagnosis not present

## 2015-12-03 DIAGNOSIS — R030 Elevated blood-pressure reading, without diagnosis of hypertension: Secondary | ICD-10-CM

## 2015-12-03 MED ORDER — KETOROLAC TROMETHAMINE 30 MG/ML IJ SOLN
30.0000 mg | Freq: Once | INTRAMUSCULAR | Status: AC
  Administered 2015-12-03: 30 mg via INTRAMUSCULAR

## 2015-12-03 NOTE — Progress Notes (Signed)
Chief Complaint  Patient presents with  . Hypertension    Pt report BP 174/84 with dizziness and headache    Subjective: Patient is a 65 y.o. female here for HA.  Started this AM when she woke up. Normally it gets better after a cup of coffee, but it didn't go away. B/l worse in the back of her neck area. No injury. She does have a hx of migraines from years ago, cannot remember what it was like. She is sensitive to light and sound. Denies numbness, tingling or weakness. Has not tried anything at home.  ROS: Neuro: As noted in HPI  Family History  Problem Relation Age of Onset  . Hypertension Sister     sister, daughter , mother   . Hyperlipidemia Sister   . Thyroid disease Sister   . Diabetes Daughter   . Stroke Mother   . Colon cancer Neg Hx   . Sudden death Neg Hx   . Heart attack Neg Hx   . Breast cancer Neg Hx    Past Medical History:  Diagnosis Date  . Asthma   . Chiari malformation type I (HCC)   . Diabetes mellitus   . GERD (gastroesophageal reflux disease)   . Hx of gout   . Hyperlipidemia   . Hyperparathyroidism (HCC)   . Hypertension   . Lumbar radiculopathy   . Postablative hypothyroidism   . Sickle cell anemia (HCC)   . Spondylolysis   . Thyroid disease    Allergies  Allergen Reactions  . Citrus Itching, Rash and Swelling  . Ibuprofen Swelling and Rash    Pt now states that she can not take ibuprofen  . Other Itching, Rash and Swelling    seafood. seafood    Current Outpatient Prescriptions:  .  aspirin 81 MG tablet, Take 81 mg by mouth daily.  , Disp: , Rfl:  .  calcium elemental as carbonate (BARIATRIC TUMS ULTRA) 400 MG chewable tablet, Chew 1,000 mg by mouth as needed for heartburn. Reported on 07/23/2015, Disp: , Rfl:  .  fexofenadine (ALLEGRA) 180 MG tablet, Take 180 mg by mouth daily., Disp: , Rfl:  .  glucose blood (ONE TOUCH ULTRA TEST) test strip, Reported on 07/09/2015, Disp: , Rfl:  .  meloxicam (MOBIC) 15 MG tablet, Take 1 tablet (15 mg  total) by mouth daily., Disp: 90 tablet, Rfl: 0 .  metFORMIN (GLUCOPHAGE-XR) 500 MG 24 hr tablet, Take 1 tablet (500 mg total) by mouth 3 (three) times daily with meals., Disp: 90 tablet, Rfl: 5  Objective: BP (!) 150/72 (BP Location: Left Arm, Patient Position: Sitting, Cuff Size: Small)   Pulse 79   Temp 98 F (36.7 C) (Oral)   Ht 5\' 7"  (1.702 m)   Wt 189 lb 6.4 oz (85.9 kg)   SpO2 98%   BMI 29.66 kg/m  General: Awake, appears stated age HEENT: MMM, EOMi, PERRLA, no sinus tenderness Heart: RRR, no murmurs Lungs: CTAB, no rales, wheezes or rhonchi. No accessory muscle use Neuro: patellar DTR 2/4 b/l, biceps reflex 1/4 b/l, calcaneal reflex 0/4 b/l, no clonus, no cerebellar signs MSK: TTP over the cervical paraspinal musculature, more tender on R. No TTP over TMJ or jaw deviation. No TTP over temporalis.  Psych: Age appropriate judgment and insight, normal affect and mood  Assessment and Plan: Acute non intractable tension-type headache - Plan: ketorolac (TORADOL) 30 MG/ML injection 30 mg  Elevated blood pressure reading  Orders as above. OK to start using Mobic tomorrow,  do not use today.  Heat, stretching. F/u in 2 weeks for a nurse visit BP check. The patient voiced understanding and agreement to the plan.  Jilda Rocheicholas Paul [place], DO 12/03/15  5:02 PM

## 2015-12-03 NOTE — Patient Instructions (Signed)
Don't use meloxicam until tomorrow. You only need to use this as needed.

## 2015-12-03 NOTE — Telephone Encounter (Signed)
[facility] note received via fax  Call:   Date: 12/03/15 Time: 1102   Caller: Self Return number: [phone]  Nurse: Abigail MaidWindy Harley, RN  Chief Complaint: Headache  Reason for call: Elevated High BP 174/84-headache and dizzy  Related visit to physician within the last 2 weeks: N/A  Guideline: N/A  Disposition: FINAL ATTEMPT MADE-message left    **Pt seen in office today by Dr. Carmelia RollerWendling**

## 2015-12-03 NOTE — Progress Notes (Signed)
Pre visit review using our clinic review tool, if applicable. No additional management support is needed unless otherwise documented below in the visit note. 

## 2015-12-17 ENCOUNTER — Ambulatory Visit (INDEPENDENT_AMBULATORY_CARE_PROVIDER_SITE_OTHER): Payer: BC Managed Care – PPO | Admitting: Internal Medicine

## 2015-12-17 VITALS — BP 113/54 | HR 81

## 2015-12-17 DIAGNOSIS — R030 Elevated blood-pressure reading, without diagnosis of hypertension: Secondary | ICD-10-CM | POA: Diagnosis not present

## 2015-12-17 NOTE — Patient Instructions (Signed)
Per Dr. Drue NovelPaz: Blood pressure reading looks good. Call the office to schedule an appointment with PCP if having reoccurring headaches or any other symptoms.

## 2015-12-17 NOTE — Progress Notes (Signed)
Pre visit review using our clinic review tool, if applicable. No additional management support is needed unless otherwise documented below in the visit note.  Patient presents in office for blood pressure check per OV note 12/03/15. Currently, she does not take any medication for blood pressure. Today's readings were as follow: BP 136/56 P 83 & BP 113/54 P 81.  Per Dr. Drue NovelPaz: Blood pressure reading looks good. Call the office to schedule an appointment with PCP if having reoccurring headaches or any other symptoms.  Informed patient of the provider's recommendations. She voiced understanding.  Willow OraJose Paz, MD

## 2015-12-22 ENCOUNTER — Encounter: Payer: Self-pay | Admitting: Internal Medicine

## 2015-12-22 ENCOUNTER — Ambulatory Visit (INDEPENDENT_AMBULATORY_CARE_PROVIDER_SITE_OTHER): Payer: BC Managed Care – PPO | Admitting: Internal Medicine

## 2015-12-22 VITALS — BP 126/61 | HR 69 | Temp 97.8°F | Ht 68.0 in | Wt 184.8 lb

## 2015-12-22 DIAGNOSIS — R5383 Other fatigue: Secondary | ICD-10-CM | POA: Diagnosis not present

## 2015-12-22 DIAGNOSIS — G8929 Other chronic pain: Secondary | ICD-10-CM

## 2015-12-22 DIAGNOSIS — N281 Cyst of kidney, acquired: Secondary | ICD-10-CM

## 2015-12-22 DIAGNOSIS — R935 Abnormal findings on diagnostic imaging of other abdominal regions, including retroperitoneum: Secondary | ICD-10-CM

## 2015-12-22 DIAGNOSIS — R1013 Epigastric pain: Secondary | ICD-10-CM

## 2015-12-22 DIAGNOSIS — M545 Low back pain: Secondary | ICD-10-CM

## 2015-12-22 LAB — HEMOCCULT GUIAC POC 1CARD (OFFICE): FECAL OCCULT BLD: NEGATIVE

## 2015-12-22 MED ORDER — OMEPRAZOLE 40 MG PO CPDR: 40.0000 mg | DELAYED_RELEASE_CAPSULE | Freq: Every day | ORAL | 2 refills | Status: DC
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
## 2015-12-22 NOTE — Progress Notes (Signed)
Pre visit review using our clinic review tool, if applicable. No additional management support is needed unless otherwise documented below in the visit note. 

## 2015-12-22 NOTE — Patient Instructions (Signed)
GO TO THE LAB : Get the blood work     GO TO THE FRONT DESK Schedule your next appointment for a  checkup in 4 weeks  Stop meloxicam  Start omeprazole 40 mg one tablet before breakfast  For back pain, try Tylenol for now  Call anytime if you have severe symptoms, fever, chills, change in the color of the stools.

## 2015-12-23 LAB — COMPREHENSIVE METABOLIC PANEL
ALBUMIN: 4.3 g/dL (ref 3.5–5.2)
ALT: 17 U/L (ref 0–35)
AST: 20 U/L (ref 0–37)
Alkaline Phosphatase: 74 U/L (ref 39–117)
BILIRUBIN TOTAL: 0.3 mg/dL (ref 0.2–1.2)
BUN: 17 mg/dL (ref 6–23)
CALCIUM: 9.1 mg/dL (ref 8.4–10.5)
CO2: 31 mEq/L (ref 19–32)
CREATININE: 1.12 mg/dL (ref 0.40–1.20)
Chloride: 103 mEq/L (ref 96–112)
GFR: 63.01 mL/min (ref >60.00)
Glucose, Bld: 81 mg/dL (ref 70–99)
Potassium: 4.3 mEq/L (ref 3.5–5.1)
Sodium: 140 mEq/L (ref 135–145)
Total Protein: 7.5 g/dL (ref 6.0–8.3)

## 2015-12-23 LAB — LIPASE: Lipase: 28 U/L (ref 11.0–59.0)

## 2015-12-23 LAB — AMYLASE: Amylase: 50 U/L (ref 27–131)

## 2015-12-23 NOTE — Assessment & Plan Note (Signed)
Epigastric pain: Epigastric pain, in the context of frequent use of meloxicam. She is also tender at the right lower quadrant but has no symptoms consistent with appendicitis.  DDX for epigastric pain includes PUD, gallbladder, and others. Plan: Stop NSAIDs, omeprazole 40 mg daily, ultrasound, GI referral. Will check a CBC and CMP,  lipase. Back pain: We are stopping meloxicam. Recommend Tylenol. Fatigue: Since the last visit, saw neurology, they felt a sleep study was not necessary, fatigue is mostly a social issue due to the patient taking care off her husband with dementia. Labs were normal. rtc 4 weeks

## 2015-12-24 LAB — CBC WITH DIFFERENTIAL/PLATELET
Basophils Absolute: 0 10*3/uL (ref 0.0–0.1)
Basophils Relative: 0.6 % (ref 0.0–3.0)
EOS ABS: 0.3 10*3/uL (ref 0.0–0.7)
Eosinophils Relative: 4.3 % (ref 0.0–5.0)
HEMATOCRIT: 38.6 % (ref 36.0–46.0)
HEMOGLOBIN: 12.3 g/dL (ref 12.0–15.0)
LYMPHS PCT: 39 % (ref 12.0–46.0)
Lymphs Abs: 2.3 10*3/uL (ref 0.7–4.0)
MCHC: 31.8 g/dL (ref 30.0–36.0)
MCV: 89.3 fl (ref 78.0–100.0)
Monocytes Absolute: 0.5 10*3/uL (ref 0.1–1.0)
Monocytes Relative: 8.3 % (ref 3.0–12.0)
Neutro Abs: 2.9 10*3/uL (ref 1.4–7.7)
Neutrophils Relative %: 47.8 % (ref 43.0–77.0)
Platelets: 273 10*3/uL (ref 150.0–400.0)
RBC: 4.32 Mil/uL (ref 3.87–5.11)
RDW: 14.2 % (ref 11.5–15.5)
WBC: 6 10*3/uL (ref 4.0–10.5)

## 2015-12-25 ENCOUNTER — Ambulatory Visit (HOSPITAL_BASED_OUTPATIENT_CLINIC_OR_DEPARTMENT_OTHER)
Admission: RE | Admit: 2015-12-25 | Discharge: 2015-12-25 | Disposition: A | Discharge status: Home / Self Care | Payer: BC Managed Care – PPO | Source: Ambulatory Visit | Attending: Internal Medicine | Admitting: Internal Medicine

## 2015-12-25 ENCOUNTER — Ambulatory Visit (INDEPENDENT_AMBULATORY_CARE_PROVIDER_SITE_OTHER): Payer: BC Managed Care – PPO | Admitting: Ophthalmology

## 2015-12-25 DIAGNOSIS — R1013 Epigastric pain: Secondary | ICD-10-CM | POA: Diagnosis present

## 2015-12-25 DIAGNOSIS — H35033 Hypertensive retinopathy, bilateral: Secondary | ICD-10-CM | POA: Diagnosis not present

## 2015-12-25 DIAGNOSIS — E113293 Type 2 diabetes mellitus with mild nonproliferative diabetic retinopathy without macular edema, bilateral: Secondary | ICD-10-CM

## 2015-12-25 DIAGNOSIS — I10 Essential (primary) hypertension: Secondary | ICD-10-CM

## 2015-12-25 DIAGNOSIS — E11319 Type 2 diabetes mellitus with unspecified diabetic retinopathy without macular edema: Secondary | ICD-10-CM

## 2015-12-25 DIAGNOSIS — H43813 Vitreous degeneration, bilateral: Secondary | ICD-10-CM

## 2015-12-25 DIAGNOSIS — H33302 Unspecified retinal break, left eye: Secondary | ICD-10-CM | POA: Diagnosis not present

## 2015-12-30 NOTE — Addendum Note (Signed)
Addended [place]: [name] D on: 12/30/2015 12:56 PM   Modules accepted: Orders

## 2015-12-31 ENCOUNTER — Ambulatory Visit (HOSPITAL_BASED_OUTPATIENT_CLINIC_OR_DEPARTMENT_OTHER)
Admission: RE | Admit: 2015-12-31 | Discharge: 2015-12-31 | Disposition: A | Discharge status: Home / Self Care | Payer: BC Managed Care – PPO | Source: Ambulatory Visit | Attending: Internal Medicine | Admitting: Internal Medicine

## 2015-12-31 DIAGNOSIS — R935 Abnormal findings on diagnostic imaging of other abdominal regions, including retroperitoneum: Secondary | ICD-10-CM | POA: Insufficient documentation

## 2016-01-05 ENCOUNTER — Other Ambulatory Visit: Payer: Self-pay | Admitting: Internal Medicine

## 2016-01-05 NOTE — Addendum Note (Signed)
Addended byConrad [place]: Brandalynn Ofallon D on: 01/05/2016 08:47 AM   Modules accepted: Orders

## 2016-01-09 ENCOUNTER — Ambulatory Visit (INDEPENDENT_AMBULATORY_CARE_PROVIDER_SITE_OTHER): Payer: BC Managed Care – PPO | Admitting: Internal Medicine

## 2016-01-09 ENCOUNTER — Encounter: Payer: Self-pay | Admitting: Internal Medicine

## 2016-01-09 VITALS — BP 124/74 | HR 72 | Temp 98.2°F | Resp 14 | Ht 68.0 in | Wt 185.1 lb

## 2016-01-09 DIAGNOSIS — N281 Cyst of kidney, acquired: Secondary | ICD-10-CM

## 2016-01-09 DIAGNOSIS — R1013 Epigastric pain: Secondary | ICD-10-CM | POA: Diagnosis not present

## 2016-01-09 DIAGNOSIS — R1031 Right lower quadrant pain: Secondary | ICD-10-CM | POA: Diagnosis not present

## 2016-01-09 LAB — URINALYSIS, ROUTINE W REFLEX MICROSCOPIC
BILIRUBIN URINE: NEGATIVE
GLUCOSE, UA: NEGATIVE
HGB URINE DIPSTICK: NEGATIVE
Ketones, ur: NEGATIVE
LEUKOCYTES UA: NEGATIVE
Nitrite: NEGATIVE
PROTEIN: NEGATIVE
Specific Gravity, Urine: 1.012 (ref 1.001–1.035)
pH: 7 (ref 5.0–8.0)

## 2016-01-09 MED ORDER — OMEPRAZOLE 40 MG PO CPDR: 40.0000 mg | DELAYED_RELEASE_CAPSULE | Freq: Every day | ORAL | 5 refills | Status: DC

## 2016-01-09 NOTE — Progress Notes (Signed)
Pre visit review using our clinic review tool, if applicable. No additional management support is needed unless otherwise documented below in the visit note. 
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
## 2016-01-09 NOTE — Patient Instructions (Addendum)
Continue same medications  Next visit in 6 weeks   Please call the gastroenterologist at [phone]

## 2016-01-11 LAB — URINE CULTURE

## 2016-01-11 NOTE — Assessment & Plan Note (Signed)
Follow-up from previous visit Epigastric pain: Since the last time she was here labs were  normal, imagines reviewed:. -US abdomen-the show GB sludge, fatty liver, no stones, did have a R renal cyst. -Renal ultrasound: R Complex cyst or solid mass, they recommend a CT. By stopping NSAIDs and taking PPIs epigastric pain has decreased significantly.  Continue present care, GI eval pending, patient to call them.  CT abdomen and pelvis pending ("pelvis" portion of the CT denied but she does have RLQ abd pain, thus we had re-order the test) Right lower quadrant abdominal pain: At this point the right-sided abdominal pain has no change, even after multiple questions the best she can do is localize the pain at the vicinity of the distal right abdomen and right hip. See graphic. History of hysterectomy, no oophorectomy. Plan: CT abdomen and pelvis with and without. UA-UCX RTC 6 weeks

## 2016-01-20 MED ORDER — AMOXICILLIN 500 MG PO TABS: 1000.0000 mg | ORAL_TABLET | Freq: Two times a day (BID) | ORAL | 0 refills | Status: DC

## 2016-01-20 NOTE — Addendum Note (Signed)
Addended byConrad [place]: Mcdonald Reiling D on: 01/20/2016 11:34 AM   Modules accepted: Orders

## 2016-02-02 ENCOUNTER — Ambulatory Visit (HOSPITAL_BASED_OUTPATIENT_CLINIC_OR_DEPARTMENT_OTHER)
Admission: RE | Admit: 2016-02-02 | Discharge: 2016-02-02 | Disposition: A | Discharge status: Home / Self Care | Payer: BC Managed Care – PPO | Source: Ambulatory Visit | Attending: Internal Medicine | Admitting: Internal Medicine

## 2016-02-02 DIAGNOSIS — R1031 Right lower quadrant pain: Secondary | ICD-10-CM | POA: Diagnosis present

## 2016-02-02 DIAGNOSIS — N281 Cyst of kidney, acquired: Secondary | ICD-10-CM | POA: Insufficient documentation

## 2016-02-02 MED ORDER — IOPAMIDOL (ISOVUE-370) INJECTION 76%
100.0000 mL | Freq: Once | INTRAVENOUS | Status: AC | PRN
  Administered 2016-02-02: 100 mL via INTRAVENOUS

## 2016-02-04 ENCOUNTER — Other Ambulatory Visit: Payer: Self-pay | Admitting: Internal Medicine

## 2016-02-04 DIAGNOSIS — N281 Cyst of kidney, acquired: Secondary | ICD-10-CM

## 2016-02-09 ENCOUNTER — Telehealth: Payer: Self-pay | Admitting: Internal Medicine

## 2016-02-09 NOTE — Telephone Encounter (Signed)
Patient will need lab work for MRI

## 2016-02-09 NOTE — Telephone Encounter (Signed)
Tried calling Pt, no answer, unable to leave message. Will try again later.  

## 2016-02-10 NOTE — Telephone Encounter (Signed)
Tried calling Pt's home and mobile number-again no answer, and voicemail box is full. [place], Pt's husband's number, was able to [employer]MOM informing Pt to return call.

## 2016-02-16 NOTE — Telephone Encounter (Signed)
Have been unable to reach Pt. 6 week f/u scheduled 02/20/2016.

## 2016-02-20 ENCOUNTER — Encounter: Payer: Self-pay | Admitting: Internal Medicine

## 2016-02-20 ENCOUNTER — Ambulatory Visit (INDEPENDENT_AMBULATORY_CARE_PROVIDER_SITE_OTHER): Payer: BC Managed Care – PPO | Admitting: Internal Medicine

## 2016-02-20 VITALS — BP 124/76 | HR 76 | Temp 98.0°F | Resp 14 | Ht 68.0 in | Wt 187.2 lb

## 2016-02-20 DIAGNOSIS — N281 Cyst of kidney, acquired: Secondary | ICD-10-CM | POA: Diagnosis not present

## 2016-02-20 DIAGNOSIS — R1013 Epigastric pain: Secondary | ICD-10-CM | POA: Diagnosis not present

## 2016-02-20 DIAGNOSIS — R1031 Right lower quadrant pain: Secondary | ICD-10-CM | POA: Diagnosis not present

## 2016-02-20 MED ORDER — OMEPRAZOLE 40 MG PO CPDR: 40.0000 mg | DELAYED_RELEASE_CAPSULE | Freq: Every day | ORAL | 5 refills | Status: DC
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
## 2016-02-20 NOTE — Progress Notes (Signed)
Pre visit review using our clinic review tool, if applicable. No additional management support is needed unless otherwise documented below in the visit note. 

## 2016-02-20 NOTE — Patient Instructions (Signed)
GO TO THE LAB : Get the blood work     Please call the GI office and reschedule  We have been unable to leave any messages to your phone. If you don't hear from us within few days, please call the office.  Next visit: After you get your MRI done and see the GI doctor

## 2016-02-21 LAB — BASIC METABOLIC PANEL
BUN: 13 mg/dL (ref 7–25)
CHLORIDE: 102 mmol/L (ref 98–110)
CO2: 27 mmol/L (ref 20–31)
Calcium: 8.8 mg/dL (ref 8.6–10.4)
Creat: 0.94 mg/dL (ref 0.50–0.99)
Glucose, Bld: 119 mg/dL — ABNORMAL HIGH (ref 65–99)
POTASSIUM: 4.4 mmol/L (ref 3.5–5.3)
Sodium: 140 mmol/L (ref 135–146)

## 2016-02-22 NOTE — Assessment & Plan Note (Signed)
Epigastric pain: Excellent response to PPIs, refill them as needed. GI eval pending. Encouraged to reschedule RLQ abd pain,  R sided  abdominal pain Since the last time she was here, and CT abdomen was done but not pelvic CT (apparently a misunderstanding from the CT tech). A CT showed a right kidney cyst and radiology rec a MRI. Since the the pelvis was no visualized with a CT, we'll rx  a MRI abd- pelvis with and without contrast. Renal protocol. Check a BMP in preparation for the test. Right renal cysts: See above, MRI pending.  Difficulties communicating with the patient: Unable to leave messages on her. Patient aware of the problem. If she doesn't hear from us next week she is to call. Additionally we could contact her daughter lately: [phone] per patient request Return to clinic after the MRI -GI eval are done

## 2016-02-23 ENCOUNTER — Encounter: Payer: Self-pay | Admitting: Nurse Practitioner

## 2016-02-24 ENCOUNTER — Telehealth: Payer: Self-pay | Admitting: Internal Medicine

## 2016-02-24 NOTE — Telephone Encounter (Signed)
Called patient back regarding billing inquiry. I appears the $657.88 was applied to her deductible. Patient was in class and said she would call me back later

## 2016-02-25 ENCOUNTER — Telehealth: Payer: Self-pay | Admitting: Internal Medicine

## 2016-02-25 NOTE — Telephone Encounter (Signed)
Pt called concerning about copays with insurance and needed explanation in spanish, pt was explain and understood about her insurance information.

## 2016-02-28 ENCOUNTER — Ambulatory Visit (HOSPITAL_BASED_OUTPATIENT_CLINIC_OR_DEPARTMENT_OTHER)
Admission: RE | Admit: 2016-02-28 | Discharge: 2016-02-28 | Disposition: A | Discharge status: Home / Self Care | Payer: BC Managed Care – PPO | Source: Ambulatory Visit | Attending: Internal Medicine | Admitting: Internal Medicine

## 2016-02-28 DIAGNOSIS — N281 Cyst of kidney, acquired: Secondary | ICD-10-CM | POA: Diagnosis present

## 2016-02-28 DIAGNOSIS — K760 Fatty (change of) liver, not elsewhere classified: Secondary | ICD-10-CM | POA: Insufficient documentation

## 2016-02-28 MED ORDER — GADOBENATE DIMEGLUMINE 529 MG/ML IV SOLN: 16.0000 mL | Freq: Once | INTRAVENOUS | Status: DC | PRN

## 2016-03-02 ENCOUNTER — Encounter: Payer: Self-pay | Admitting: Nurse Practitioner

## 2016-03-02 ENCOUNTER — Ambulatory Visit (INDEPENDENT_AMBULATORY_CARE_PROVIDER_SITE_OTHER): Payer: BC Managed Care – PPO | Admitting: Nurse Practitioner

## 2016-03-02 VITALS — BP 114/72 | HR 72 | Ht 67.0 in | Wt 186.0 lb

## 2016-03-02 DIAGNOSIS — R110 Nausea: Secondary | ICD-10-CM

## 2016-03-02 DIAGNOSIS — R1013 Epigastric pain: Secondary | ICD-10-CM | POA: Diagnosis not present

## 2016-03-02 DIAGNOSIS — R1031 Right lower quadrant pain: Secondary | ICD-10-CM | POA: Diagnosis not present

## 2016-03-02 MED ORDER — SUCRALFATE 1 G PO TABS: 1.0000 g | ORAL_TABLET | Freq: Two times a day (BID) | ORAL | 0 refills | Status: DC

## 2016-03-02 NOTE — Patient Instructions (Signed)
If you are age 64 or older, your body mass index should be between 23-30. Your Body mass index is 29.13 kg/m. If this is out of the aforementioned range listed, please consider follow up with your Primary Care Provider.  If you are age 464 or younger, your body mass index should be between 19-25. Your Body mass index is 29.13 kg/m. If this is out of the aformentioned range listed, please consider follow up with your Primary Care Provider.   We have sent the following medications to your pharmacy for you to pick up at your convenience:  Carafate  You have been scheduled for an endoscopy. Please follow written instructions given to you at your visit today. If you use inhalers (even only as needed), please bring them with you on the day of your procedure. Your physician has requested that you go to www.startemmi.com and enter the access code given to you at your visit today. This web site gives a general overview about your procedure. However, you should still follow specific instructions given to you by our office regarding your preparation for the procedure.  Thank you

## 2016-03-02 NOTE — Progress Notes (Addendum)
HPI:  Patient is 66 year old female with diabetes referred by PCP Willow Ora, MD for evaluation of abdominal pain. Patient describes a 5-6 month history of terrible burning epigastric pain and nausea, both worse on an empty stomach. Omeprazole has made some improvement. She avoids acidic food. She is lactose intolerant so avoids dairy. No NSAID use. Patient was on meloxicam but PCP stopped it a few months ago. She gives a remote history of H. Pylori.   Labs late November including CBC with differential  and chemistry profile were normal. Patient has no associated weight loss,  No bowel habit changes. In addition to above patient has chronic RLQ pain. Pain is not positional, not related to meals or defecation. PCP note illustrated more of a right mid abdominal pain, lateral to umbilicus. Today she points to area more distal, in RLQ.  CT scan of the abdomen and pelvis without contrast earlier this month showed a right renal cyst. Follow-up MRI revealed simple renal cysts in both kidneys, no suspicious masses. No acute abdominal disease of the abdomen.    Past Medical History:  Diagnosis Date  . Asthma   . Chiari malformation type I (HCC)   . Diabetes mellitus   . GERD (gastroesophageal reflux disease)   . Hx of gout   . Hyperlipidemia   . Hyperparathyroidism (HCC)   . Hypertension   . Lumbar radiculopathy   . Postablative hypothyroidism   . Sickle cell anemia (HCC)   . Spondylolysis   . Thyroid disease     Past Surgical History:  Procedure Laterality Date  . ABDOMINAL HYSTERECTOMY  1996   NO oophorectomy  . CERVICAL LAMINECTOMY     at C1 w/ duraplasty  . CRANIECTOMY SUBOCCIPITAL W/ CERVICAL LAMINECTOMY / CHIARI  07/05/2011   At C1, performed at [facility]  . [facility]OMY  2015   [facility]    Family History  Problem Relation Age of Onset  . Hypertension Sister     sister, daughter , mother   . Hyperlipidemia Sister   . Thyroid disease Sister   . Diabetes Daughter   . Stroke  Mother   . Colon cancer Neg Hx   . Sudden death Neg Hx   . Heart attack Neg Hx   . Breast cancer Neg Hx   . Stomach cancer Neg Hx    Social History  Substance Use Topics  . Smoking status: Never Smoker  . Smokeless tobacco: Never Used  . Alcohol use No   Current Outpatient Prescriptions  Medication Sig Dispense Refill  . aspirin 81 MG tablet Take 81 mg by mouth daily.      . calcium elemental as carbonate (BARIATRIC TUMS ULTRA) 400 MG chewable tablet Chew 1,000 mg by mouth as needed for heartburn. Reported on 07/23/2015    . fexofenadine (ALLEGRA) 180 MG tablet Take 180 mg by mouth daily.    Marland Kitchen glucose blood (ONE TOUCH ULTRA TEST) test strip Reported on 07/09/2015    . Magnesium 100 MG CAPS Take by mouth.    . metFORMIN (GLUCOPHAGE-XR) 500 MG 24 hr tablet TAKE 1 TABLET BY MOUTH THREE TIMES DAILY WITH MEALS 90 tablet 0  . omeprazole (PRILOSEC) 40 MG capsule Take 1 capsule (40 mg total) by mouth daily before breakfast. 30 capsule 5  . Potassium 99 MG TABS Take by mouth.     No current facility-administered medications for this visit.    Allergies  Allergen Reactions  . Citrus Itching, Rash and Swelling  .  Ibuprofen Swelling and Rash    Pt now states that she can not take ibuprofen  . Other Itching, Rash and Swelling    seafood. seafood     Review of Systems: Positive for back pain, muscle pain, swelling of the legs and feet, excessive urination, urinary leakage and excessive urination. All other systems reviewed and negative except where noted in HPI.    Physical Exam: BP 114/72   Pulse 72   Ht 5\' 7"  (1.702 m)   Wt 186 lb (84.4 kg)   BMI 29.13 kg/m  Constitutional:  Well-developed female in no acute distress. Psychiatric: Normal mood and affect. Behavior is normal. EENT: Conjunctivae are normal. No scleral icterus. Neck supple.  Cardiovascular: Normal rate, regular rhythm.  Pulmonary/chest: Effort normal and breath sounds normal. No wheezing, rales or  rhonchi. Abdominal: Soft, nondistended, moderate epigastric tenderness, bowel sounds active throughout. There are no masses palpable. No hepatomegaly. Extremities: no edema Lymphadenopathy: No cervical adenopathy noted. Neurological: Alert and oriented to person place and time. Skin: Skin is warm and dry. No rashes noted.   ASSESSMENT AND PLAN:  461. 66 year old female with several month history of nausea and burning epigastric pain radiating through to back and predominantly postprandial. Symptoms improved but unresolved with PPI . Labs reassuring including CBC, lipase and chemistry profile. Ultrasound shows some biliary sludge, probable steatosis but no other findings. -For evaluation of ongoing epigastric burning and nausea patient will be scheduled for an upper endoscopy.The risks and benefits of EGD were discussed and the patient agrees to proceed.  -trial of carafate while awaiting EGD.  -If EGD negative, consider gallbladder emptying study and / or surgical referral   2. DM 2, controlled. Last A1c 6.5 late Sept  3. Colon cancer screening. She apparently had a colonoscopy in [place] within the last 5 years, will request results  Willette ClusterPaula Guenther, NP  03/02/2016, 10:17 AM  Cc: Wanda PlumpPaz, Jose E, MD   Addendum: Reviewed and agree with initial management. Beverley FiedlerJay M Pyrtle, MD

## 2016-03-15 ENCOUNTER — Encounter: Payer: Self-pay | Admitting: Family

## 2016-03-15 ENCOUNTER — Ambulatory Visit (INDEPENDENT_AMBULATORY_CARE_PROVIDER_SITE_OTHER): Payer: BC Managed Care – PPO | Admitting: Family

## 2016-03-15 VITALS — BP 118/78 | HR 70 | Temp 98.1°F | Ht 67.0 in | Wt 185.6 lb

## 2016-03-15 DIAGNOSIS — J111 Influenza due to unidentified influenza virus with other respiratory manifestations: Secondary | ICD-10-CM | POA: Diagnosis not present

## 2016-03-15 NOTE — Patient Instructions (Addendum)
Please call if new/worsening symptoms or if symptoms are not improved in 3 days.    Influenza, Adult Influenza ("the flu") is an infection in the lungs, nose, and throat (respiratory tract). It is caused by a virus. The flu causes many common cold symptoms, as well as a high fever and body aches. It can make you feel very sick. The flu spreads easily from person to person (is contagious). Getting a flu shot (influenza vaccination) every year is the best way to prevent the flu. Follow these instructions at home:  Take over-the-counter and prescription medicines only as told by your doctor.  Use a cool mist humidifier to add moisture (humidity) to the air in your home. This can make it easier to breathe.  Rest as needed.  Drink enough fluid to keep your pee (urine) clear or pale yellow.  Cover your mouth and nose when you cough or sneeze.  Wash your hands with soap and water often, especially after you cough or sneeze. If you cannot use soap and water, use hand sanitizer.  Stay home from work or school as told by your doctor. Unless you are visiting your doctor, try to avoid leaving home until your fever has been gone for 24 hours without the use of medicine.  Keep all follow-up visits as told by your doctor. This is important. How is this prevented?  Getting a yearly (annual) flu shot is the best way to avoid getting the flu. You may get the flu shot in late summer, fall, or winter. Ask your doctor when you should get your flu shot.  Wash your hands often or use hand sanitizer often.  Avoid contact with people who are sick during cold and flu season.  Eat healthy foods.  Drink plenty of fluids.  Get enough sleep.  Exercise regularly. Contact a doctor if:  You get new symptoms.  You have:  Chest pain.  Watery poop (diarrhea).  A fever.  Your cough gets worse.  You start to have more mucus.  You feel sick to your stomach (nauseous).  You throw up (vomit). Get  help right away if:  You start to be short of breath or have trouble breathing.  Your skin or nails turn a bluish color.  You have very bad pain or stiffness in your neck.  You get a sudden headache.  You get sudden pain in your face or ear.  You cannot stop throwing up. This information is not intended to replace advice given to you by your health care provider. Make sure you discuss any questions you have with your health care provider. Document Released: 10/21/2007 Document Revised: 06/19/2015 Document Reviewed: 11/05/2014 Elsevier Interactive Patient Education  2017 [employer]

## 2016-03-15 NOTE — Progress Notes (Signed)
Pre visit review using our clinic review tool, if applicable. No additional management support is needed unless otherwise documented below in the visit note. 

## 2016-03-15 NOTE — Progress Notes (Signed)
Subjective:    Patient ID: Abigail LaymanIleana Tejada-de [name], female    DOB: [birth year], 66 y.o.   MRN: [id number]  HPI  Ms. Abigail Sheererejada-de [name] is a 66 yr old female with hx of DM2 who presents today with chief complaint of cough.  She works at a high school. Reports that she developed cough/HA runny nose last week.  Had extreme fatigue. She first noticed 7 days ago.  She reports that she has had exposure to flu by several students.     Review of Systems See HPI  Past Medical History:  Diagnosis Date  . Asthma   . Chiari malformation type I (HCC)   . Diabetes mellitus   . GERD (gastroesophageal reflux disease)   . Hx of gout   . Hyperlipidemia   . Hyperparathyroidism (HCC)   . Hypertension   . Lumbar radiculopathy   . Postablative hypothyroidism   . Sickle cell anemia (HCC)   . Spondylolysis   . Thyroid disease      Social History   Social History  . Marital status: Married    Spouse name: N/A  . Number of children: 1  . Years of education: N/A   Occupational History  . teacher     Social History Main Topics  . Smoking status: Never Smoker  . Smokeless tobacco: Never Used  . Alcohol use No  . Drug use: No  . Sexual activity: Yes   Other Topics Concern  . Not on file   Social History Narrative   Original from [place]      Married x 44 years    Past Surgical History:  Procedure Laterality Date  . ABDOMINAL HYSTERECTOMY  1996   NO oophorectomy  . CERVICAL LAMINECTOMY     at C1 w/ duraplasty  . CRANIECTOMY SUBOCCIPITAL W/ CERVICAL LAMINECTOMY / CHIARI  07/05/2011   At C[facility]  . [facility]OMY  2015   [facility]     Family History  Problem Relation Age of Onset  . Hypertension Sister     sister, daughter , mother   . Hyperlipidemia Sister   . Thyroid disease Sister   . Diabetes Daughter   . Stroke Mother   . Colon cancer Neg Hx   . Sudden death Neg Hx   . Heart attack Neg Hx   . Breast cancer Neg Hx   . Stomach cancer Neg Hx      Allergies  Allergen Reactions  . Citrus Itching, Rash and Swelling  . Ibuprofen Swelling and Rash    Pt now states that she can not take ibuprofen  . Other Itching, Rash and Swelling    seafood. seafood    Current Outpatient Prescriptions on File Prior to Visit  Medication Sig Dispense Refill  . aspirin 81 MG tablet Take 81 mg by mouth daily.      . calcium elemental as carbonate (BARIATRIC TUMS ULTRA) 400 MG chewable tablet Chew 1,000 mg by mouth as needed for heartburn. Reported on 07/23/2015    . fexofenadine (ALLEGRA) 180 MG tablet Take 180 mg by mouth daily.    Marland Kitchen. glucose blood (ONE TOUCH ULTRA TEST) test strip Reported on 07/09/2015    . Magnesium 100 MG CAPS Take by mouth.    . metFORMIN (GLUCOPHAGE-XR) 500 MG 24 hr tablet TAKE 1 TABLET BY MOUTH THREE TIMES DAILY WITH MEALS 90 tablet 0  . omeprazole (PRILOSEC) 40 MG capsule Take 1 capsule (40 mg total) by mouth daily before breakfast. 30 capsule  5  . Potassium 99 MG TABS Take by mouth.    . sucralfate (CARAFATE) 1 g tablet Take 1 tablet (1 g total) by mouth 2 (two) times daily. Take one tablet before breakfast and one tablet before dinner 30 tablet 0   No current facility-administered medications on file prior to visit.     BP 118/78 (BP Location: Left Arm, Patient Position: Sitting, Cuff Size: Large)   Pulse 70   Temp 98.1 F (36.7 C) (Oral)   Ht 5\' 7"  (1.702 m)   Wt 185 lb 9.6 oz (84.2 kg)   SpO2 96%   BMI 29.07 kg/m       Objective:   Physical Exam  Constitutional: She is oriented to person, place, and time. She appears well-developed and well-nourished.  HENT:  Head: Normocephalic and atraumatic.  Right Ear: Tympanic membrane and ear canal normal.  Left Ear: Tympanic membrane and ear canal normal.  Mouth/Throat: No oropharyngeal exudate, posterior oropharyngeal edema or posterior oropharyngeal erythema.  Cardiovascular: Normal rate, regular rhythm and normal heart sounds.   No murmur heard. Pulmonary/Chest:  Effort normal and breath sounds normal. No respiratory distress. She has no wheezes.  Musculoskeletal: She exhibits no edema.  Lymphadenopathy:    She has no cervical adenopathy.  Neurological: She is alert and oriented to person, place, and time.  Psychiatric: She has a normal mood and affect. Her behavior is normal. Judgment and thought content normal.          Assessment & Plan:  Influenza- symptoms most consistent with resolving influenza. We do not have any flu swabs available at this time, but she is outside of the tamiflu window anyway. Offered cough medication, but she declines. She is advised to continue tylenol as needed and to call if new/worsening symptoms or if symptoms do not continue to improve.

## 2016-03-16 ENCOUNTER — Telehealth: Payer: Self-pay | Admitting: *Deleted

## 2016-03-16 NOTE — Telephone Encounter (Signed)
Noted and agree. 

## 2016-03-16 NOTE — Telephone Encounter (Signed)
That is correct, she is outside of treatment window which is what I told her at her visit.

## 2016-03-16 NOTE — Telephone Encounter (Signed)
[facility](Jackie Matos): Good morning my friend I have Ms Abigail [name] MRN [id number] who came in yesterday to see Efraim KaufmannMelissa, she brought in her husband since he had lab appt today but wanted to ask that yesterday we did not have the system to verify if she had flu, she wanted to know since she is still not feeling good did we received the equipment that verify's if pt has flu so she can get that done?  Called and spoke with pt. Advised her that confirmatory testing is not necessary at this time as it will not change her course of treatment since she is outside the window for tamiflu. She will need rest, hydration and symptom treatment. Pt states she is taking tylenol. Had nausea and diarrhea last night with a little nausea remaining this morning. Thinks she has cough medication at home. Advised her to try clear liquid diet while nausea and diarrhea continue.  Please advise if any other recommendations?

## 2016-03-16 NOTE — Telephone Encounter (Signed)
Spoke with pt again. She states she thought she was previously told by her Provider that she could get Tamiflu. Advised pt again that her symptoms have been present too long for the tamiflu to be effective and it is not recommended at this time. Pt apologized and states she just misunderstood.

## 2016-03-16 NOTE — Telephone Encounter (Signed)
Sherin Quarryleana Self Pt's Tel [phone] Pharmacy: [facility] [id number] - [place], [place] - [address]  Pt called and states still not feeling good at all and is wanting to have prescription sent to her pharmacy ASAP. Please advise.

## 2016-03-17 ENCOUNTER — Telehealth: Payer: Self-pay | Admitting: Internal Medicine

## 2016-03-17 NOTE — Telephone Encounter (Signed)
Notified pt. 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 If still feeling bad and is unable to work she will call for an appointment.

## 2016-03-17 NOTE — Telephone Encounter (Signed)
Spoke with pt. States that she woke up with nausea today and feels very weak. States she is drinking about 3 bottles of water a day. Please advise if any other recommendations.

## 2016-03-17 NOTE — Telephone Encounter (Signed)
Caller name: Relationship to patient: Self Can be reached: [phone]  Pharmacy:  Reason for call: Patient states that she is still feeling sick and weak and could not go to work yesterday and today. Was seen on Monday by Melissa but not given any medication. Patient request call back.

## 2016-03-17 NOTE — Telephone Encounter (Signed)
She should be seen in office tomorrow if not improved. Drink 6-8 glasses water a day.Go to ER if severe symptoms.

## 2016-03-18 ENCOUNTER — Encounter: Payer: Self-pay | Admitting: Internal Medicine

## 2016-04-01 ENCOUNTER — Encounter: Payer: BC Managed Care – PPO | Admitting: Internal Medicine

## 2016-04-10 ENCOUNTER — Other Ambulatory Visit: Payer: Self-pay | Admitting: Internal Medicine

## 2016-04-19 ENCOUNTER — Other Ambulatory Visit: Payer: Self-pay | Admitting: Internal Medicine

## 2016-05-02 ENCOUNTER — Other Ambulatory Visit: Payer: Self-pay | Admitting: Internal Medicine

## 2016-05-03 ENCOUNTER — Other Ambulatory Visit: Payer: Self-pay

## 2016-05-03 MED ORDER — METFORMIN HCL ER 500 MG PO TB24: 500.0000 mg | ORAL_TABLET | Freq: Three times a day (TID) | ORAL | 0 refills | Status: DC

## 2016-05-12 ENCOUNTER — Ambulatory Visit (INDEPENDENT_AMBULATORY_CARE_PROVIDER_SITE_OTHER): Payer: BC Managed Care – PPO | Admitting: Internal Medicine

## 2016-05-12 ENCOUNTER — Encounter: Payer: Self-pay | Admitting: Internal Medicine

## 2016-05-12 VITALS — BP 126/62 | HR 68 | Temp 97.9°F | Resp 12 | Ht 67.32 in | Wt 182.6 lb

## 2016-05-12 DIAGNOSIS — E210 Primary hyperparathyroidism: Secondary | ICD-10-CM

## 2016-05-12 DIAGNOSIS — E119 Type 2 diabetes mellitus without complications: Secondary | ICD-10-CM | POA: Diagnosis not present

## 2016-05-12 LAB — POCT GLYCOSYLATED HEMOGLOBIN (HGB A1C): HEMOGLOBIN A1C: 6.7

## 2016-05-12 MED ORDER — METFORMIN HCL ER 500 MG PO TB24: 500.0000 mg | ORAL_TABLET | Freq: Three times a day (TID) | ORAL | 3 refills | Status: DC

## 2016-05-12 NOTE — Patient Instructions (Addendum)
Patient Instructions  Please continue Metformin ER 500 mg 3x a day.   Please return in 3-4 months with your sugar log.

## 2016-05-12 NOTE — Progress Notes (Signed)
Patient ID: Abigail [name], female   DOB: Nov 14, 1950, 66 y.o.   MRN: [id number]  HPI: Abigail [name] is a 66 y.o.-year-old female, returning for f/u for DM2, dx in ~2010, non-insulin-dependent, controlled, without complications and h/o primary HPTH. Her husband, [name], is also a patient of mine.. Last visit 7 mo ago.  She is congested >> has an URI.  Last hemoglobin A1c was: Lab Results  Component Value Date   HGBA1C 6.5 10/23/2015   HGBA1C 6.5 07/23/2015   HGBA1C 6.7 (H) 05/06/2015   Pt is on a regimen of: - Metformin ER 500 mg 2x >> 3x a day  Pt checks her sugars 0-1x a day and they are: - am: 89-110 >> 99-131 >> 98-111 >> 104-130 - 2h after b'fast: n/c >> 147 >> n/c - before lunch: n/c >> 126 - 2h after lunch: n/c >> 117, 175 >> 140s >> n/c - before dinner: n/c >> 90-130 >> n/c >> 100 - 2h after dinner: 120-135 >> 144 >> n/c  - bedtime: n/c >> 132 >> n/c - nighttime: n/c No lows. Lowest sugar was 89 >> 52 (when on 3 Metformin tabs a day) >> 87; she has hypoglycemia awareness at 80s.  Highest sugar was 240x1 >> 252 (dietary indiscretions) >>  163 at bedtime.  Glucometer: ReliOn  Pt's meals are: - Breakfast: fruits, eggs - Lunch: chicken/fish + veggies - no red meat  - Dinner: veggies + fruit +/- chicken/fish - Snacks: cashews She is eliminating CH.  - no CKD, last BUN/creatinine:  Lab Results  Component Value Date   BUN 13 02/20/2016   CREATININE 0.94 02/20/2016   - last set of lipids: Lab Results  Component Value Date   CHOL 169 05/06/2015   HDL 59.20 05/06/2015   LDLCALC 96 05/06/2015   TRIG 71.0 05/06/2015   CHOLHDL 3 05/06/2015   - last eye exam was in 11/2015. No DR.  - + numbness and tingling in her feet. Foot exam normal 05/06/2015.  She has a h/o primary hyperparathyroidism - s/p 2x parathyroid glands excised in 2015, with resolution of hypercalcemia.  She occasionally takes Tums x1 when feels weak - not every day.  Latest  calcium levels normal: Lab Results  Component Value Date   CALCIUM 8.8 02/20/2016   CALCIUM 9.1 12/22/2015   CALCIUM 9.2 07/09/2015   CALCIUM 9.2 01/02/2015   CALCIUM 10.4 05/03/2006   ROS: Constitutional: no weight gain/loss, no fatigue, no subjective hyperthermia/hypothermia Eyes: no blurry vision, no xerophthalmia ENT: no sore throat, no nodules palpated in throat, + dysphagia/no odynophagia, no hoarseness Cardiovascular: no CP/+ SOB/no palpitations/+ leg swelling Respiratory: + cough/+ SOB Gastrointestinal: no N/V/D/C/ + heartburn Musculoskeletal: no muscle/+ joint aches Skin: no rashes, + hair loss Neurological: no tremors/numbness/tingling/dizziness  I reviewed pt's medications, allergies, PMH, social hx, family hx, and changes were documented in the history of present illness. Otherwise, unchanged from my initial visit note.  Past Medical History:  Diagnosis Date  . Asthma   . Chiari malformation type I (HCC)   . Diabetes mellitus   . GERD (gastroesophageal reflux disease)   . Hx of gout   . Hyperlipidemia   . Hyperparathyroidism (HCC)   . Hypertension   . Lumbar radiculopathy   . Postablative hypothyroidism   . Sickle cell anemia (HCC)   . Spondylolysis   . Thyroid disease    Past Surgical History:  Procedure Laterality Date  . ABDOMINAL HYSTERECTOMY  1996   NO oophorectomy  .  CERVICAL LAMINECTOMY     at C1 w/ duraplasty  . CRANIECTOMY SUBOCCIPITAL W/ CERVICAL LAMINECTOMY / CHIARI  07/05/2011   At C1, performed at [facility]  . PARATH[facility]ROIDECTOMY  2015   [facility]    Social History   Social History  . Marital Status: Married    Spouse Name: N/A  . Number of Children: 1   Occupational History  . teacher - high school     Social History Main Topics  . Smoking status: Never Smoker   . Smokeless tobacco: Not on file  . Alcohol Use: No  . Drug Use: No   Social History Narrative   Original from [place]   Married x 44 years   Current Outpatient Prescriptions  on File Prior to Visit  Medication Sig Dispense Refill  . aspirin 81 MG tablet Take 81 mg by mouth daily.      . calcium elemental as carbonate (BARIATRIC TUMS ULTRA) 400 MG chewable tablet Chew 1,000 mg by mouth as needed for heartburn. Reported on 07/23/2015    . fexofenadine (ALLEGRA) 180 MG tablet Take 180 mg by mouth daily.    Marland Kitchen glucose blood (ONE TOUCH ULTRA TEST) test strip Reported on 07/09/2015    . Magnesium 100 MG CAPS Take by mouth.    . metFORMIN (GLUCOPHAGE-XR) 500 MG 24 hr tablet Take 1 tablet (500 mg total) by mouth 3 (three) times daily with meals. 90 tablet 0  . omeprazole (PRILOSEC) 40 MG capsule Take 1 capsule (40 mg total) by mouth daily before breakfast. 30 capsule 5  . Potassium 99 MG TABS Take by mouth.    . sucralfate (CARAFATE) 1 g tablet Take 1 tablet (1 g total) by mouth 2 (two) times daily. Take one tablet before breakfast and one tablet before dinner 30 tablet 0   No current facility-administered medications on file prior to visit.    Allergies  Allergen Reactions  . Citrus Itching, Rash and Swelling  . Ibuprofen Swelling and Rash    Pt now states that she can not take ibuprofen  . Other Itching, Rash and Swelling    seafood. seafood   Family History  Problem Relation Age of Onset  . Hypertension Sister     sister, daughter , mother   . Hyperlipidemia Sister   . Thyroid disease Sister   . Diabetes Daughter   . Stroke Mother   . Colon cancer Neg Hx   . Sudden death Neg Hx   . Heart attack Neg Hx   . Breast cancer Neg Hx   . Stomach cancer Neg Hx    PE: There were no vitals taken for this visit. Wt Readings from Last 3 Encounters:  03/15/16 185 lb 9.6 oz (84.2 kg)  03/02/16 186 lb (84.4 kg)  02/20/16 187 lb 4 oz (84.9 kg)   Constitutional: overweight, in NAD Eyes: PERRLA, EOMI, no exophthalmos ENT: moist mucous membranes, no thyromegaly, no cervical lymphadenopathy Cardiovascular: RRR, No MRG Respiratory: CTA B Gastrointestinal: abdomen soft,  NT, ND, BS+ Musculoskeletal: no deformities, strength intact in all 4 Skin: moist, warm, no rashes Neurological: no tremor with outstretched hands, DTR normal in all 4  ASSESSMENT: 1. DM2, non-insulin-dependent, uncontrolled, without complications  2. History of hyperparathyroidism -  status post 2 gland parathyroidectomy in 2015   PLAN:  1. Patient with uncontrolled diabetes, on oral antidiabetic regimen, now with good control, but not checking sugars later in the day >> again advised to start. - she is now tolerating the Metformin  ER better >> is on this 3x a day. Will continue. - I suggested to:  Patient Instructions  Please continue Metformin ER 500 mg 3x a day.   Please return in 3-4 months with your sugar log.   - continue checking sugars at different times of the day - check once a day, rotating checks - advised for yearly eye exams >> she is UTD - given flu shot at last visit - checked HbA1c >> 6.7% (slightly higher!) - Return to clinic in 3-4 mo with sugar log   2. History of primary hyperparathyroidism - S/p parathyroidectomy >> reviewed recent calcium levels >> normal  - On prn 1xTums when feeling tired or with weakness in legs.   Carlus Pavlov, MD PhD [facility] Endocrinology

## 2016-05-13 ENCOUNTER — Encounter: Payer: BC Managed Care – PPO | Admitting: Internal Medicine

## 2016-05-14 ENCOUNTER — Encounter: Payer: Self-pay | Admitting: Internal Medicine

## 2016-05-14 ENCOUNTER — Ambulatory Visit (INDEPENDENT_AMBULATORY_CARE_PROVIDER_SITE_OTHER): Payer: BC Managed Care – PPO | Admitting: Internal Medicine

## 2016-05-14 VITALS — BP 130/82 | HR 81 | Temp 98.0°F | Resp 14 | Ht 67.32 in | Wt 180.4 lb

## 2016-05-14 DIAGNOSIS — B349 Viral infection, unspecified: Secondary | ICD-10-CM

## 2016-05-14 MED ORDER — BENZONATATE 200 MG PO CAPS: 200.0000 mg | ORAL_CAPSULE | Freq: Three times a day (TID) | ORAL | 0 refills | Status: DC | PRN

## 2016-05-14 MED ORDER — AMOXICILLIN 500 MG PO CAPS: 1000.0000 mg | ORAL_CAPSULE | Freq: Two times a day (BID) | ORAL | 0 refills | Status: DC

## 2016-05-14 NOTE — Patient Instructions (Signed)
Rest, fluids , tylenol  For cough:  Take OTC Mucinex DM twice a day as needed until better If the cough continue, take [employer], prescription sent  For nasal congestion: Use OTC Flonase : 2 nasal sprays on each side of the nose in the morning until you feel better  Take the antibiotic as prescribed  (Amoxicillin) only if not improving in the next 3 or 4 days  Call if not gradually better over the next  10 days  Call anytime if the symptoms are severe

## 2016-05-14 NOTE — Assessment & Plan Note (Signed)
Viral syndrome: Rx conservative treatment with rest, Tylenol, Mucinex, Tessalon. If not improving gradually, start amoxicillin. See instructions. Abdominal pain: See previous visit, reports that she is not having any stomach problems, MRI of the kidneys report reviewed, Bosniak category 1.
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
## 2016-05-14 NOTE — Progress Notes (Signed)
Pre visit review using our clinic review tool, if applicable. No additional management support is needed unless otherwise documented below in the visit note. 

## 2016-05-20 ENCOUNTER — Encounter: Payer: BC Managed Care – PPO | Admitting: Internal Medicine

## 2016-06-05 ENCOUNTER — Other Ambulatory Visit: Payer: Self-pay | Admitting: Internal Medicine

## 2016-06-09 ENCOUNTER — Encounter: Payer: Self-pay | Admitting: Gynecology

## 2016-07-20 ENCOUNTER — Ambulatory Visit (INDEPENDENT_AMBULATORY_CARE_PROVIDER_SITE_OTHER): Payer: BC Managed Care – PPO | Admitting: Internal Medicine

## 2016-07-20 ENCOUNTER — Encounter: Payer: Self-pay | Admitting: Internal Medicine

## 2016-07-20 VITALS — BP 118/70 | HR 70 | Temp 98.2°F | Resp 14 | Ht 67.0 in | Wt 180.5 lb

## 2016-07-20 DIAGNOSIS — Z0000 Encounter for general adult medical examination without abnormal findings: Secondary | ICD-10-CM | POA: Diagnosis not present

## 2016-07-20 DIAGNOSIS — Z114 Encounter for screening for human immunodeficiency virus [HIV]: Secondary | ICD-10-CM

## 2016-07-20 DIAGNOSIS — Z1159 Encounter for screening for other viral diseases: Secondary | ICD-10-CM

## 2016-07-20 MED ORDER — HYDROCORTISONE 2.5 % EX CREA: TOPICAL_CREAM | Freq: Two times a day (BID) | CUTANEOUS | 0 refills | Status: DC
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
## 2016-07-20 NOTE — Patient Instructions (Addendum)
GO TO THE FRONT DESK  Come back fasting this month week for blood work.  Schedule your next appointment for a  checkup in 6 months  Use hydrocortisone twice a day as needed for the rash on the legs.     Diabetes and Foot Care Diabetes may cause you to have problems because of poor blood supply (circulation) to your feet and legs. This may cause the skin on your feet to become thinner, break easier, and heal more slowly. Your skin may become dry, and the skin may peel and crack. You may also have nerve damage in your legs and feet causing decreased feeling in them. You may not notice minor injuries to your feet that could lead to infections or more serious problems. Taking care of your feet is one of the most important things you can do for yourself. Follow these instructions at home:  Wear shoes at all times, even in the house. Do not go barefoot. Bare feet are easily injured.  Check your feet daily for blisters, cuts, and redness. If you cannot see the bottom of your feet, use a mirror or ask someone for help.  Wash your feet with warm water (do not use hot water) and mild soap. Then pat your feet and the areas between your toes until they are completely dry. Do not soak your feet as this can dry your skin.  Apply a moisturizing lotion or petroleum jelly (that does not contain alcohol and is unscented) to the skin on your feet and to dry, brittle toenails. Do not apply lotion between your toes.  Trim your toenails straight across. Do not dig under them or around the cuticle. File the edges of your nails with an emery board or nail file.  Do not cut corns or calluses or try to remove them with medicine.  Wear clean socks or stockings every day. Make sure they are not too tight. Do not wear knee-high stockings since they may decrease blood flow to your legs.  Wear shoes that fit properly and have enough cushioning. To break in new shoes, wear them for just a few hours a day. This  prevents you from injuring your feet. Always look in your shoes before you put them on to be sure there are no objects inside.  Do not cross your legs. This may decrease the blood flow to your feet.  If you find a minor scrape, cut, or break in the skin on your feet, keep it and the skin around it clean and dry. These areas may be cleansed with mild soap and water. Do not cleanse the area with peroxide, alcohol, or iodine.  When you remove an adhesive bandage, be sure not to damage the skin around it.  If you have a wound, look at it several times a day to make sure it is healing.  Do not use heating pads or hot water bottles. They may burn your skin. If you have lost feeling in your feet or legs, you may not know it is happening until it is too late.  Make sure your health care provider performs a complete foot exam at least annually or more often if you have foot problems. Report any cuts, sores, or bruises to your health care provider immediately. Contact a health care provider if:  You have an injury that is not healing.  You have cuts or breaks in the skin.  You have an ingrown nail.  You notice redness on your legs or  feet.  You feel burning or tingling in your legs or feet.  You have pain or cramps in your legs and feet.  Your legs or feet are numb.  Your feet always feel cold. Get help right away if:  There is increasing redness, swelling, or pain in or around a wound.  There is a red line that goes up your leg.  Pus is coming from a wound.  You develop a fever or as directed by your health care provider.  You notice a bad smell coming from an ulcer or wound. This information is not intended to replace advice given to you by your health care provider. Make sure you discuss any questions you have with your health care provider. Document Released: 01/09/2000 Document Revised: 06/19/2015 Document Reviewed: 06/20/2012 Elsevier Interactive Patient Education  2017  [employer].

## 2016-07-20 NOTE — Progress Notes (Signed)
Pre visit review using our clinic review tool, if applicable. No additional management support is needed unless otherwise documented below in the visit note. 

## 2016-07-20 NOTE — Assessment & Plan Note (Addendum)
--   Td 04-2015; pnm 23: 2016 -- Female care: saw gyn 08-2015, PAP-HPV (-), per note no further PAPs;  MMG Neg 05-2015 --CCS: 07-2012 , no actual records  --RTC fasting for CMP, FLP, folic acid, B12, TSH, HIV, hep C -- Reports diet has d improve in the last few weeks

## 2016-07-21 ENCOUNTER — Other Ambulatory Visit (INDEPENDENT_AMBULATORY_CARE_PROVIDER_SITE_OTHER): Payer: BC Managed Care – PPO

## 2016-07-21 DIAGNOSIS — Z0000 Encounter for general adult medical examination without abnormal findings: Secondary | ICD-10-CM | POA: Diagnosis not present

## 2016-07-21 DIAGNOSIS — Z114 Encounter for screening for human immunodeficiency virus [HIV]: Secondary | ICD-10-CM

## 2016-07-21 DIAGNOSIS — Z1159 Encounter for screening for other viral diseases: Secondary | ICD-10-CM

## 2016-07-21 LAB — LIPID PANEL
CHOL/HDL RATIO: 3
Cholesterol: 177 mg/dL (ref 0–200)
HDL: 61.6 mg/dL (ref >39.00)
LDL CALC: 98 mg/dL (ref 0–99)
NonHDL: 115.27
Triglycerides: 85 mg/dL (ref 0.0–149.0)
VLDL: 17 mg/dL (ref 0.0–40.0)

## 2016-07-21 LAB — COMPREHENSIVE METABOLIC PANEL
ALBUMIN: 4.5 g/dL (ref 3.5–5.2)
ALT: 15 U/L (ref 0–35)
AST: 20 U/L (ref 0–37)
Alkaline Phosphatase: 71 U/L (ref 39–117)
BILIRUBIN TOTAL: 0.6 mg/dL (ref 0.2–1.2)
BUN: 13 mg/dL (ref 6–23)
CHLORIDE: 102 meq/L (ref 96–112)
CO2: 31 meq/L (ref 19–32)
CREATININE: 0.91 mg/dL (ref 0.40–1.20)
Calcium: 9.1 mg/dL (ref 8.4–10.5)
GFR: 79.93 mL/min (ref >60.00)
Glucose, Bld: 121 mg/dL — ABNORMAL HIGH (ref 70–99)
Potassium: 4.2 mEq/L (ref 3.5–5.1)
SODIUM: 141 meq/L (ref 135–145)
Total Protein: 7.3 g/dL (ref 6.0–8.3)

## 2016-07-21 LAB — TSH: TSH: 1.88 u[IU]/mL (ref 0.35–4.50)

## 2016-07-21 LAB — VITAMIN B12: VITAMIN B 12: 623 pg/mL (ref 211–911)

## 2016-07-21 LAB — FOLATE

## 2016-07-22 LAB — HIV ANTIBODY (ROUTINE TESTING W REFLEX): HIV 1&2 Ab, 4th Generation: NONREACTIVE

## 2016-07-22 LAB — HEPATITIS C ANTIBODY: HCV AB: NEGATIVE

## 2016-07-22 NOTE — Assessment & Plan Note (Signed)
DM: Per endocrinology Neuropathy: Likely due to DM, will check a B12, folic acid and TSH. Discussed feet care, offered gabapentin: declined Abdominal pain: Patient reports sxs have resurface, recommend to call GI. Needs to pursue EGD. Rash, etiology unclear, recommend a trial with topical hydrocortisone. RTC 6 months

## 2016-07-26 ENCOUNTER — Ambulatory Visit (INDEPENDENT_AMBULATORY_CARE_PROVIDER_SITE_OTHER): Payer: BC Managed Care – PPO | Admitting: Internal Medicine

## 2016-07-26 ENCOUNTER — Encounter: Payer: Self-pay | Admitting: Internal Medicine

## 2016-07-26 VITALS — BP 126/66 | HR 69 | Temp 98.0°F | Resp 14 | Ht 67.0 in | Wt 180.1 lb

## 2016-07-26 DIAGNOSIS — J029 Acute pharyngitis, unspecified: Secondary | ICD-10-CM

## 2016-07-26 DIAGNOSIS — B349 Viral infection, unspecified: Secondary | ICD-10-CM

## 2016-07-26 LAB — POCT INFLUENZA A/B
Influenza A, POC: NEGATIVE
Influenza B, POC: NEGATIVE

## 2016-07-26 LAB — POCT RAPID STREP A (OFFICE): RAPID STREP A SCREEN: NEGATIVE

## 2016-07-26 MED ORDER — HYDROCODONE-HOMATROPINE 5-1.5 MG/5ML PO SYRP: 5.0000 mL | ORAL_SOLUTION | Freq: Every evening | ORAL | 0 refills | Status: DC | PRN

## 2016-07-26 MED ORDER — AZITHROMYCIN 250 MG PO TABS: ORAL_TABLET | ORAL | 0 refills | Status: DC
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
## 2016-07-26 NOTE — Progress Notes (Signed)
Pre visit review using our clinic review tool, if applicable. No additional management support is needed unless otherwise documented below in the visit note. 

## 2016-07-26 NOTE — Patient Instructions (Addendum)
Rest, fluids , tylenol  For cough:  Take Mucinex DM twice a day as needed until better If the cough is severe, take hydrocodone at night. Will cause drowsiness.  For nasal congestion: Use OTC Nasocort or Flonase : 2 nasal sprays on each side of the nose in the morning until you feel better     Take the antibiotic as prescribed  (Zithromax) only if not better in few days  Call if not gradually better over the next  10 days  Call anytime if the symptoms are severe

## 2016-07-27 ENCOUNTER — Telehealth: Payer: Self-pay | Admitting: Internal Medicine

## 2016-07-27 NOTE — Telephone Encounter (Signed)
Patient is calling to see if she can take the Hydrocodone-Homatropine 5-1.5 MG/5ML 5 mLs   in the morning because it helps with her coughing and pain, she said she was instructed to take it a bedtime but needs it her the day as well. Please advise.   [phone]

## 2016-07-27 NOTE — Telephone Encounter (Signed)
Spoke w/ Pt, informed Hycodan can be taken during the day, however to take with caution as it will make her drowsy. Recommend she try OTC Mucinex DM for daily use. Pt verbalized understanding.

## 2016-07-28 NOTE — Assessment & Plan Note (Signed)
Viral syndrome: Strep and flu test negative. Likely a viral syndrome with predominant cough. See instructions, antibiotic only if not improving gradually.

## 2016-09-06 ENCOUNTER — Encounter: Payer: Self-pay | Admitting: Obstetrics & Gynecology

## 2016-09-06 ENCOUNTER — Ambulatory Visit (INDEPENDENT_AMBULATORY_CARE_PROVIDER_SITE_OTHER): Payer: BC Managed Care – PPO | Admitting: Obstetrics & Gynecology

## 2016-09-06 VITALS — BP 128/70 | Ht 67.0 in | Wt 176.0 lb

## 2016-09-06 DIAGNOSIS — N644 Mastodynia: Secondary | ICD-10-CM | POA: Diagnosis not present

## 2016-09-06 DIAGNOSIS — Z780 Asymptomatic menopausal state: Secondary | ICD-10-CM | POA: Diagnosis not present

## 2016-09-06 DIAGNOSIS — Z01411 Encounter for gynecological examination (general) (routine) with abnormal findings: Secondary | ICD-10-CM | POA: Diagnosis not present

## 2016-09-06 NOTE — Progress Notes (Signed)
Abigail [name] 08/09/1950 [id number]   History:    66 y.o. G1P1 Married x 46 yrs.  Husband has Alzheimer.  RP:  Established patient presenting for annual gyn exam  HPI:  Menopause.  No HRT.  No PMB.  S/P TAH for Endometriosis, but conservation of ovaries.  No pelvic pain.  Intermittent shooting pain around Left nipple.  No nipple d/c, no skin change, no lump felt.  Mictions/BMs wnl.     Past medical history,surgical history, family history and social history were all reviewed and documented in the EPIC chart.  Gynecologic History No LMP recorded. Patient has had a hysterectomy. Contraception: status post hysterectomy Last Pap: 2015. Results were: Normal Last mammogram: 04/2015. Results were: normal  Obstetric History OB History  Gravida Para Term Preterm AB Living  1 1       1   SAB TAB Ectopic Multiple Live Births          1    # Outcome Date GA Lbr Len/2nd Weight Sex Delivery Anes PTL Lv  1 Para     F Vag-Spont          ROS: A ROS was performed and pertinent positives and negatives are included in the history.  GENERAL: No fevers or chills. HEENT: No change in vision, no earache, sore throat or sinus congestion. NECK: No pain or stiffness. CARDIOVASCULAR: No chest pain or pressure. No palpitations. PULMONARY: No shortness of breath, cough or wheeze. GASTROINTESTINAL: No abdominal pain, nausea, vomiting or diarrhea, melena or bright red blood per rectum. GENITOURINARY: No urinary frequency, urgency, hesitancy or dysuria. MUSCULOSKELETAL: No joint or muscle pain, no back pain, no recent trauma. DERMATOLOGIC: No rash, no itching, no lesions. ENDOCRINE: No polyuria, polydipsia, no heat or cold intolerance. No recent change in weight. HEMATOLOGICAL: No anemia or easy bruising or bleeding. NEUROLOGIC: No headache, seizures, numbness, tingling or weakness. PSYCHIATRIC: No depression, no loss of interest in normal activity or change in sleep pattern.     Exam:   BP 128/70    Ht 5\' 7"  (1.702 m)   Wt 176 lb (79.8 kg)   BMI 27.57 kg/m   Body mass index is 27.57 kg/m.  General appearance : Well developed well nourished female. No acute distress HEENT: Eyes: no retinal hemorrhage or exudates,  Neck supple, trachea midline, no carotid bruits, no thyroidmegaly Lungs: Clear to auscultation, no rhonchi or wheezes, or rib retractions  Heart: Regular rate and rhythm, no murmurs or gallops Breast:Examined in sitting and supine position were symmetrical in appearance, no palpable masses or tenderness,  no skin retraction, no nipple inversion, no nipple discharge, no skin discoloration, no axillary or supraclavicular lymphadenopathy Abdomen: no palpable masses or tenderness, no rebound or guarding Extremities: no edema or skin discoloration or tenderness  Pelvic: Vulva: Normal  Bartholin, Urethra, Skene Glands: Within normal limits             Vagina: No gross lesions or discharge.  Pap reflex done.  Cervix/Uterus:  Absent  Adnexa  Without masses or tenderness  Anus and perineum  normal     Assessment/Plan:  66 y.o. female for annual exam   1. Encounter for gynecological examination with abnormal finding Gyn exam S/P TAH.  Pap reflex done.  Breasts normal exam, but Lt Breast pain.  Message sent to schedule Lt Dx mammo/US and Rt screening mammo.  2. Menopause present No HRT.  Mild Atrophic Vaginitis, but not sexually active.  Vit D supplements, Ca++ in diet, wt  bearing physical activity.  3. Pain of left breast But normal Breast exam.  Decision to proceed with Lt Dx mammo/Breast US and screening Rt mammo.  Counseling on above issues >50% x 10 minutes.  Genia Del MD, 2:46 PM 09/06/2016

## 2016-09-06 NOTE — Patient Instructions (Signed)
1. Encounter for gynecological examination with abnormal finding Gyn exam S/P TAH.  Pap reflex done.  Breasts normal exam, but Lt Breast pain.  Message sent to schedule Lt Dx mammo/US and Rt screening mammo.  2. Menopause present No HRT.  Mild Atrophic Vaginitis, but not sexually active.  Vit D supplements, Ca++ in diet, wt bearing physical activity.  3. Pain of left breast But normal Breast exam.  Decision to proceed with Lt Dx mammo/Breast US and screening Rt mammo.  Abigail [name], it was a pleasure to meet you today!  I will inform you of your results as soon as available.   Health Maintenance for Postmenopausal Women Menopause is a normal process in which your reproductive ability comes to an end. This process happens gradually over a span of months to years, usually between the ages of 26 and 20. Menopause is complete when you have missed 12 consecutive menstrual periods. It is important to talk with your health care provider about some of the most common conditions that affect postmenopausal women, such as heart disease, cancer, and bone loss (osteoporosis). Adopting a healthy lifestyle and getting preventive care can help to promote your health and wellness. Those actions can also lower your chances of developing some of these common conditions. What should I know about menopause? During menopause, you may experience a number of symptoms, such as:  Moderate-to-severe hot flashes.  Night sweats.  Decrease in sex drive.  Mood swings.  Headaches.  Tiredness.  Irritability.  Memory problems.  Insomnia.  Choosing to treat or not to treat menopausal changes is an individual decision that you make with your health care provider. What should I know about hormone replacement therapy and supplements? Hormone therapy products are effective for treating symptoms that are associated with menopause, such as hot flashes and night sweats. Hormone replacement carries certain risks, especially as  you become older. If you are thinking about using estrogen or estrogen with progestin treatments, discuss the benefits and risks with your health care provider. What should I know about heart disease and stroke? Heart disease, heart attack, and stroke become more likely as you age. This may be due, in part, to the hormonal changes that your body experiences during menopause. These can affect how your body processes dietary fats, triglycerides, and cholesterol. Heart attack and stroke are both medical emergencies. There are many things that you can do to help prevent heart disease and stroke:  Have your blood pressure checked at least every 1-2 years. High blood pressure causes heart disease and increases the risk of stroke.  If you are 17-76 years old, ask your health care provider if you should take aspirin to prevent a heart attack or a stroke.  Do not use any tobacco products, including cigarettes, chewing tobacco, or electronic cigarettes. If you need help quitting, ask your health care provider.  It is important to eat a healthy diet and maintain a healthy weight. ? Be sure to include plenty of vegetables, fruits, low-fat dairy products, and lean protein. ? Avoid eating foods that are high in solid fats, added sugars, or salt (sodium).  Get regular exercise. This is one of the most important things that you can do for your health. ? Try to exercise for at least 150 minutes each week. The type of exercise that you do should increase your heart rate and make you sweat. This is known as moderate-intensity exercise. ? Try to do strengthening exercises at least twice each week. Do these in addition  to the moderate-intensity exercise.  Know your numbers.Ask your health care provider to check your cholesterol and your blood glucose. Continue to have your blood tested as directed by your health care provider.  What should I know about cancer screening? There are several types of cancer. Take the  following steps to reduce your risk and to catch any cancer development as early as possible. Breast Cancer  Practice breast self-awareness. ? This means understanding how your breasts normally appear and feel. ? It also means doing regular breast self-exams. Let your health care provider know about any changes, no matter how small.  If you are 16 or older, have a clinician do a breast exam (clinical breast exam or CBE) every year. Depending on your age, family history, and medical history, it may be recommended that you also have a yearly breast X-ray (mammogram).  If you have a family history of breast cancer, talk with your health care provider about genetic screening.  If you are at high risk for breast cancer, talk with your health care provider about having an MRI and a mammogram every year.  Breast cancer (BRCA) gene test is recommended for women who have family members with BRCA-related cancers. Results of the assessment will determine the need for genetic counseling and BRCA1 and for BRCA2 testing. BRCA-related cancers include these types: ? Breast. This occurs in males or females. ? Ovarian. ? Tubal. This may also be called fallopian tube cancer. ? Cancer of the abdominal or pelvic lining (peritoneal cancer). ? Prostate. ? Pancreatic.  Cervical, Uterine, and Ovarian Cancer Your health care provider may recommend that you be screened regularly for cancer of the pelvic organs. These include your ovaries, uterus, and vagina. This screening involves a pelvic exam, which includes checking for microscopic changes to the surface of your cervix (Pap test).  For women ages 21-65, health care providers may recommend a pelvic exam and a Pap test every three years. For women ages 59-65, they may recommend the Pap test and pelvic exam, combined with testing for human papilloma virus (HPV), every five years. Some types of HPV increase your risk of cervical cancer. Testing for HPV may also be done  on women of any age who have unclear Pap test results.  Other health care providers may not recommend any screening for nonpregnant women who are considered low risk for pelvic cancer and have no symptoms. Ask your health care provider if a screening pelvic exam is right for you.  If you have had past treatment for cervical cancer or a condition that could lead to cancer, you need Pap tests and screening for cancer for at least 20 years after your treatment. If Pap tests have been discontinued for you, your risk factors (such as having a new sexual partner) need to be reassessed to determine if you should start having screenings again. Some women have medical problems that increase the chance of getting cervical cancer. In these cases, your health care provider may recommend that you have screening and Pap tests more often.  If you have a family history of uterine cancer or ovarian cancer, talk with your health care provider about genetic screening.  If you have vaginal bleeding after reaching menopause, tell your health care provider.  There are currently no reliable tests available to screen for ovarian cancer.  Lung Cancer Lung cancer screening is recommended for adults 73-71 years old who are at high risk for lung cancer because of a history of smoking. A yearly  low-dose CT scan of the lungs is recommended if you:  Currently smoke.  Have a history of at least 30 pack-years of smoking and you currently smoke or have quit within the past 15 years. A pack-year is smoking an average of one pack of cigarettes per day for one year.  Yearly screening should:  Continue until it has been 15 years since you quit.  Stop if you develop a health problem that would prevent you from having lung cancer treatment.  Colorectal Cancer  This type of cancer can be detected and can often be prevented.  Routine colorectal cancer screening usually begins at age 26 and continues through age 73.  If you  have risk factors for colon cancer, your health care provider may recommend that you be screened at an earlier age.  If you have a family history of colorectal cancer, talk with your health care provider about genetic screening.  Your health care provider may also recommend using home test kits to check for hidden blood in your stool.  A small camera at the end of a tube can be used to examine your colon directly (sigmoidoscopy or colonoscopy). This is done to check for the earliest forms of colorectal cancer.  Direct examination of the colon should be repeated every 5-10 years until age 46. However, if early forms of precancerous polyps or small growths are found or if you have a family history or genetic risk for colorectal cancer, you may need to be screened more often.  Skin Cancer  Check your skin from head to toe regularly.  Monitor any moles. Be sure to tell your health care provider: ? About any new moles or changes in moles, especially if there is a change in a mole's shape or color. ? If you have a mole that is larger than the size of a pencil eraser.  If any of your family members has a history of skin cancer, especially at a young age, talk with your health care provider about genetic screening.  Always use sunscreen. Apply sunscreen liberally and repeatedly throughout the day.  Whenever you are outside, protect yourself by wearing long sleeves, pants, a wide-brimmed hat, and sunglasses.  What should I know about osteoporosis? Osteoporosis is a condition in which bone destruction happens more quickly than new bone creation. After menopause, you may be at an increased risk for osteoporosis. To help prevent osteoporosis or the bone fractures that can happen because of osteoporosis, the following is recommended:  If you are 40-33 years old, get at least 1,000 mg of calcium and at least 600 mg of vitamin D per day.  If you are older than age 28 but younger than age 47, get at  least 1,200 mg of calcium and at least 600 mg of vitamin D per day.  If you are older than age 36, get at least 1,200 mg of calcium and at least 800 mg of vitamin D per day.  Smoking and excessive alcohol intake increase the risk of osteoporosis. Eat foods that are rich in calcium and vitamin D, and do weight-bearing exercises several times each week as directed by your health care provider. What should I know about how menopause affects my mental health? Depression may occur at any age, but it is more common as you become older. Common symptoms of depression include:  Low or sad mood.  Changes in sleep patterns.  Changes in appetite or eating patterns.  Feeling an overall lack of motivation or enjoyment  of activities that you previously enjoyed.  Frequent crying spells.  Talk with your health care provider if you think that you are experiencing depression. What should I know about immunizations? It is important that you get and maintain your immunizations. These include:  Tetanus, diphtheria, and pertussis (Tdap) booster vaccine.  Influenza every year before the flu season begins.  Pneumonia vaccine.  Shingles vaccine.  Your health care provider may also recommend other immunizations. This information is not intended to replace advice given to you by your health care provider. Make sure you discuss any questions you have with your health care provider. Document Released: 03/05/2005 Document Revised: 08/01/2015 Document Reviewed: 10/15/2014 Elsevier Interactive Patient Education  2018 [employer].

## 2016-09-07 ENCOUNTER — Telehealth: Payer: Self-pay | Admitting: *Deleted

## 2016-09-07 DIAGNOSIS — N644 Mastodynia: Secondary | ICD-10-CM

## 2016-09-07 NOTE — Telephone Encounter (Signed)
appointment at [facility] center on 09/10/16 @ 12:50pm pt informed.

## 2016-09-07 NOTE — Telephone Encounter (Signed)
-----   Message from Genia DelMarie-Lyne Lavoie, MD sent at 09/06/2016  3:38 PM EDT ----- Regarding: Refer for Left Dx mammo/Left Breast US Left Breast pain medially.  No nodule/mass felt.  Lt Dx mammo/US and Rt screening Mammo.

## 2016-09-08 ENCOUNTER — Ambulatory Visit: Admission: RE | Admit: 2016-09-08 | Payer: BC Managed Care – PPO | Source: Ambulatory Visit

## 2016-09-08 ENCOUNTER — Ambulatory Visit
Admission: RE | Admit: 2016-09-08 | Discharge: 2016-09-08 | Disposition: A | Discharge status: Home / Self Care | Payer: BC Managed Care – PPO | Source: Ambulatory Visit | Attending: Obstetrics & Gynecology | Admitting: Obstetrics & Gynecology

## 2016-09-08 DIAGNOSIS — N644 Mastodynia: Secondary | ICD-10-CM

## 2016-09-09 ENCOUNTER — Ambulatory Visit: Payer: BC Managed Care – PPO | Admitting: Internal Medicine

## 2016-09-09 LAB — PAP IG W/ RFLX HPV ASCU

## 2016-09-10 ENCOUNTER — Other Ambulatory Visit: Payer: BC Managed Care – PPO

## 2016-09-10 ENCOUNTER — Encounter: Payer: Self-pay | Admitting: *Deleted

## 2016-11-23 ENCOUNTER — Encounter: Payer: Self-pay | Admitting: Internal Medicine

## 2016-11-23 ENCOUNTER — Ambulatory Visit (INDEPENDENT_AMBULATORY_CARE_PROVIDER_SITE_OTHER): Payer: BC Managed Care – PPO | Admitting: Internal Medicine

## 2016-11-23 VITALS — BP 132/72 | HR 60

## 2016-11-23 DIAGNOSIS — E119 Type 2 diabetes mellitus without complications: Secondary | ICD-10-CM | POA: Diagnosis not present

## 2016-11-23 LAB — POCT GLYCOSYLATED HEMOGLOBIN (HGB A1C): Hemoglobin A1C: 6.5

## 2016-11-23 NOTE — Progress Notes (Addendum)
Patient ID: Abigail [name], female   DOB: July 16, 1950, 66 y.o.   MRN: [id number]  HPI: Abigail [name] is a 66 y.o.-year-old female, returning for f/u for DM2, dx in ~2010, non-insulin-dependent, controlled, without complications and h/o primary HPTH. She is hwe, Abigail [name], is also a patient of mine and she accompanies him today. Last visit 6 mo ago.  Last hemoglobin A1c was: Lab Results  Component Value Date   HGBA1C 6.7 05/12/2016   HGBA1C 6.5 10/23/2015   HGBA1C 6.5 07/23/2015   Pt is on a regimen of: - Metformin ER 500 mg 2x >> 3x a day  Pt checks her sugars 1x a day:  - am: 89-110 >> 99-131 >> 98-111 >> 104-130 >> 93-131, 134 - 2h after b'fast: n/c >> 147 >> n/c >> 96, 149, 159 - before lunch: n/c >> 105, 126 - 2h after lunch: n/c >> 117, 175 >> 140s >> n/c - before dinner: n/c >> 90-130 >> n/c >> 100 >> 114, 184 - 2h after dinner: 120-135 >> 144 >> n/c >> 189 (ate late) - bedtime: n/c >> 132 >> n/c - nighttime: n/c Lowest sugar was 87 >> 93; she has hypoglycemia awareness at 80s.  Highest sugar was 240x1 >> 252 (dietary indiscretions) >>  163 at bedtime >> 189.  Glucometer: ReliOn  Pt's meals are: - Breakfast: fruits, eggs - Lunch: chicken/fish + veggies - no red meat  - Dinner: veggies + fruit +/- chicken/fish - Snacks: cashews, fruit (mango)  - No CKD, last BUN/creatinine:  Lab Results  Component Value Date   BUN 13 07/21/2016   CREATININE 0.91 07/21/2016   - last set of lipids: Lab Results  Component Value Date   CHOL 177 07/21/2016   HDL 61.60 07/21/2016   LDLCALC 98 07/21/2016   TRIG 85.0 07/21/2016   CHOLHDL 3 07/21/2016   - last eye exam was in 11/2015 >> No DR - + numbness and tingling in her feet. Foot exam normal 05/06/2015.  She has a h/o primary hyperparathyroidism - s/p 2x parathyroid glands excised in 2015, with resolution of hypercalcemia.  She occasionally takes Tums x1 when feels weak - not on a daily basis  Latest  calcium levels normal: Lab Results  Component Value Date   CALCIUM 9.1 07/21/2016   CALCIUM 8.8 02/20/2016   CALCIUM 9.1 12/22/2015   CALCIUM 9.2 07/09/2015   CALCIUM 9.2 01/02/2015   CALCIUM 10.4 05/03/2006   ROS: Constitutional: + weight gain/no weight loss, + fatigue, no subjective hyperthermia, no subjective hypothermia Eyes: + blurry vision (has a small cataract), no xerophthalmia ENT: no sore throat, no nodules palpated in throat, no dysphagia, no odynophagia, no hoarseness Cardiovascular: no CP/no SOB/no palpitations/no leg swelling Respiratory: no cough/no SOB/no wheezing Gastrointestinal: no N/no V/no D/no C/+ acid reflux Musculoskeletal: + muscle aches/+ joint aches Skin: no rashes,+ hair loss Neurological: no tremors/no numbness/no tingling/no dizziness  I reviewed pt's medications, allergies, PMH, social hx, family hx, and changes were documented in the history of present illness. Otherwise, unchanged from my initial visit note.   Past Medical History:  Diagnosis Date  . Asthma   . Chiari malformation type I (HCC)   . Diabetes mellitus   . GERD (gastroesophageal reflux disease)   . Hx of gout   . Hyperlipidemia   . Hyperparathyroidism (HCC)   . Hypertension   . Lumbar radiculopathy   . Postablative hypothyroidism   . Sickle cell anemia (HCC)   . Spondylolysis   . Thyroid  disease    Past Surgical History:  Procedure Laterality Date  . ABDOMINAL HYSTERECTOMY  1996   NO oophorectomy  . BREAST BIOPSY Left 2015   [facility]   . CERVICAL LAMINECTOMY     at C1 w/ duraplasty  . CRANIECTOMY SUBOCCIPITAL W/ CERVICAL LAMINECTOMY / CHIARI  07/05/2011   At C1, [facility]  . PARATH[facility]IDECTOMY  2015   [facility]    Social History   Social History  . Marital Status: Married    Spouse Name: N/A  . Number of Children: 1   Occupational History  . teacher - high school     Social History Main Topics  . Smoking status: Never Smoker   . Smokeless tobacco: Not on  file  . Alcohol Use: No  . Drug Use: No   Social History Narrative   Original from [place]   Married x 44 years   Current Outpatient Prescriptions on File Prior to Visit  Medication Sig Dispense Refill  . aspirin 81 MG tablet Take 81 mg by mouth daily.      Marland Kitchen glucose blood (ONE TOUCH ULTRA TEST) test strip Reported on 07/09/2015    . hydrocortisone 2.5 % cream Apply topically 2 (two) times daily. 30 g 0  . Magnesium 100 MG CAPS Take by mouth.    . metFORMIN (GLUCOPHAGE-XR) 500 MG 24 hr tablet TAKE 1 TABLET BY MOUTH THREE TIMES DAILY WITH MEALS 90 tablet 0  . Potassium 99 MG TABS Take by mouth.     No current facility-administered medications on file prior to visit.    Allergies  Allergen Reactions  . Pollen Extract Itching  . Citrus Itching, Rash and Swelling  . Ibuprofen Swelling and Rash    Pt now states that she can not take ibuprofen  . Other Itching, Rash and Swelling    seafood. seafood   Family History  Problem Relation Age of Onset  . Hypertension Sister        sister, daughter , mother   . Hyperlipidemia Sister   . Thyroid disease Sister   . Diabetes Daughter   . Stroke Mother   . Colon cancer Neg Hx   . Sudden death Neg Hx   . Heart attack Neg Hx   . Breast cancer Neg Hx   . Stomach cancer Neg Hx    PE: BP 132/72 (BP Location: Left Arm, Patient Position: Sitting)   Pulse 60   SpO2 99%  Wt Readings from Last 3 Encounters:  09/06/16 176 lb (79.8 kg)  07/26/16 180 lb 2 oz (81.7 kg)  07/20/16 180 lb 8 oz (81.9 kg)   Constitutional: overweight, in NAD Eyes: PERRLA, EOMI, no exophthalmos ENT: moist mucous membranes, no thyromegaly, no cervical lymphadenopathy Cardiovascular: RRR, No MRG Respiratory: CTA B Gastrointestinal: abdomen soft, NT, ND, BS+ Musculoskeletal: no deformities, strength intact in all 4 Skin: moist, warm, no rashes Neurological: no tremor with outstretched hands, DTR normal in all 4  ASSESSMENT: 1. DM2, non-insulin-dependent,  uncontrolled, without complications  2. History of hyperparathyroidism -  status post 2 gland parathyroidectomy in 2015   PLAN:  1. Patient with now more controlled diabetes, on oral antidiabetic regimen with metformin ER only, currently on 3 times a day dosing, without complaints. Sugars are at or slightly above goal depending on dietary indiscretions. No changes are needed in her regimen today. - I suggested to:   Patient Instructions  Please continue Metformin ER 500 mg 3x a day.   Please return in  3-4 months with your sugar log.   - today, HbA1c is 6.5% (better) - continue checking sugars at different times of the day - check 1x a day, rotating checks - advised for yearly eye exams >> she is UTD - will get flu shot at appt with PCP next week. - Return to clinic in 3-4 mo with sugar log   2. History of primary hyperparathyroidism - She is status post parathyroidectomy - reviewed recent calcium labs and they have been normal  -  she takes when necessary calcium (Tums) when feels weak/tired  Carlus Pavlov, MD PhD [facility] Endocrinology

## 2016-11-23 NOTE — Patient Instructions (Signed)
Patient Instructions  Please continue Metformin ER 500 mg 3x a day.   Please return in 3-4 months with your sugar log.

## 2016-11-23 NOTE — Addendum Note (Signed)
Addended by: Tameem Pullara T on: 11/23/2016 04:33 PM   Modules accepted: Orders  

## 2016-12-23 ENCOUNTER — Ambulatory Visit: Payer: BC Managed Care – PPO | Admitting: Internal Medicine

## 2016-12-23 ENCOUNTER — Encounter: Payer: Self-pay | Admitting: Internal Medicine

## 2016-12-23 VITALS — BP 124/68 | HR 76 | Temp 97.6°F | Resp 14 | Ht 67.0 in | Wt 182.0 lb

## 2016-12-23 DIAGNOSIS — Z23 Encounter for immunization: Secondary | ICD-10-CM | POA: Diagnosis not present

## 2016-12-23 DIAGNOSIS — M545 Low back pain, unspecified: Secondary | ICD-10-CM

## 2016-12-23 DIAGNOSIS — N644 Mastodynia: Secondary | ICD-10-CM

## 2016-12-23 NOTE — Progress Notes (Signed)
Pre visit review using our clinic review tool, if applicable. No additional management support is needed unless otherwise documented below in the visit note. 

## 2016-12-23 NOTE — Patient Instructions (Addendum)
Take Flonase 2 sprays on each side of the nose daily to prevent sore throat  Practice self breast exam monthly
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
## 2016-12-26 NOTE — Assessment & Plan Note (Signed)
Pectoral/breast exam: Suspect MSK issue rather than a breast problem per se.  Normal mammogram 08-2016.  Recommend Tylenol as needed, call if not better.  Continue SBE Right-sided back pain: Chronic on and off issue, recently spent many nights on a chair while the husband was in the hospital, thus pain is mildly exacerbated.  Again recommend tylenol as needed Sore throat: See HPI, recommend Flonase.  Call if no better

## 2016-12-30 ENCOUNTER — Ambulatory Visit (INDEPENDENT_AMBULATORY_CARE_PROVIDER_SITE_OTHER): Payer: BC Managed Care – PPO | Admitting: Ophthalmology

## 2017-01-03 ENCOUNTER — Ambulatory Visit: Payer: BC Managed Care – PPO | Admitting: Internal Medicine

## 2017-01-05 ENCOUNTER — Telehealth: Payer: Self-pay | Admitting: Internal Medicine

## 2017-01-05 DIAGNOSIS — Z0279 Encounter for issue of other medical certificate: Secondary | ICD-10-CM

## 2017-01-05 NOTE — Telephone Encounter (Signed)
Copied from CRM #[id number]. Topic: Quick Communication - See Telephone Encounter >> Jan 05, 2017  1:24 PM Valentina LucksMatos, Jackelin wrote: CRM for notification. See Telephone encounter for:  01/05/17.   Pt dropped off document to be filled out by provider (FMLA- 5 pages). Pt stated that it has to do with her husband who needs her care. Please fax document to Chales AbrahamsMary Ann at [phone] when done. Document put at front office tray.

## 2017-01-06 NOTE — Telephone Encounter (Signed)
Forms completed, signed and faxed to number provided by Pt.

## 2017-01-13 ENCOUNTER — Encounter (INDEPENDENT_AMBULATORY_CARE_PROVIDER_SITE_OTHER): Payer: BC Managed Care – PPO | Admitting: Ophthalmology

## 2017-01-13 DIAGNOSIS — I10 Essential (primary) hypertension: Secondary | ICD-10-CM

## 2017-01-13 DIAGNOSIS — E113293 Type 2 diabetes mellitus with mild nonproliferative diabetic retinopathy without macular edema, bilateral: Secondary | ICD-10-CM | POA: Diagnosis not present

## 2017-01-13 DIAGNOSIS — H2513 Age-related nuclear cataract, bilateral: Secondary | ICD-10-CM | POA: Diagnosis not present

## 2017-01-13 DIAGNOSIS — H35033 Hypertensive retinopathy, bilateral: Secondary | ICD-10-CM

## 2017-01-13 DIAGNOSIS — E11319 Type 2 diabetes mellitus with unspecified diabetic retinopathy without macular edema: Secondary | ICD-10-CM

## 2017-01-13 DIAGNOSIS — H33302 Unspecified retinal break, left eye: Secondary | ICD-10-CM | POA: Diagnosis not present

## 2017-01-13 DIAGNOSIS — H43813 Vitreous degeneration, bilateral: Secondary | ICD-10-CM

## 2017-02-08 ENCOUNTER — Telehealth: Payer: Self-pay | Admitting: Internal Medicine

## 2017-02-08 ENCOUNTER — Ambulatory Visit: Payer: BC Managed Care – PPO | Admitting: Internal Medicine

## 2017-02-08 ENCOUNTER — Encounter: Payer: Self-pay | Admitting: Internal Medicine

## 2017-02-08 VITALS — BP 124/78 | HR 77 | Temp 97.7°F | Resp 14 | Ht 67.0 in | Wt 184.0 lb

## 2017-02-08 DIAGNOSIS — B349 Viral infection, unspecified: Secondary | ICD-10-CM

## 2017-02-08 DIAGNOSIS — R52 Pain, unspecified: Secondary | ICD-10-CM | POA: Diagnosis not present

## 2017-02-08 DIAGNOSIS — R059 Cough, unspecified: Secondary | ICD-10-CM

## 2017-02-08 DIAGNOSIS — R05 Cough: Secondary | ICD-10-CM

## 2017-02-08 LAB — POCT INFLUENZA A/B
Influenza A, POC: NEGATIVE
Influenza B, POC: NEGATIVE

## 2017-02-08 NOTE — Patient Instructions (Signed)
Rest, fluids , tylenol  For cough:  Take Mucinex DM twice a day as needed until better  For nasal congestion: Use OTC Flonase : 2 nasal sprays on each side of the nose in the morning until you feel better   Avoid decongestants such as  Pseudoephedrine or phenylephrine    Call if not gradually better over the next  10 days  Call anytime if the symptoms are severe   
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
## 2017-02-08 NOTE — Telephone Encounter (Signed)
See Pt's husbands chart, Gracy RacerEduardo Morgan.

## 2017-02-08 NOTE — Assessment & Plan Note (Signed)
Viral syndrome: Sxs c/w a virus, flu test negative, does not look sick, no evidence of pneumonia on clinical grounds.  Conservative treatment, see instructions.

## 2017-02-08 NOTE — Telephone Encounter (Signed)
Pt wants you to give her a call regarding her husband. She said he is Dr. Drue NovelPaz patient. [phone]. Thanks

## 2017-02-08 NOTE — Progress Notes (Signed)
Pre visit review using our clinic review tool, if applicable. No additional management support is needed unless otherwise documented below in the visit note. 

## 2017-02-24 ENCOUNTER — Ambulatory Visit: Payer: BC Managed Care – PPO | Admitting: Internal Medicine

## 2017-03-11 ENCOUNTER — Ambulatory Visit: Payer: BC Managed Care – PPO | Admitting: Internal Medicine

## 2017-03-11 ENCOUNTER — Encounter: Payer: Self-pay | Admitting: Internal Medicine

## 2017-03-11 VITALS — BP 132/78 | HR 69 | Temp 97.7°F | Resp 14 | Ht 67.0 in | Wt 182.0 lb

## 2017-03-11 DIAGNOSIS — R399 Unspecified symptoms and signs involving the genitourinary system: Secondary | ICD-10-CM | POA: Diagnosis not present

## 2017-03-11 DIAGNOSIS — R202 Paresthesia of skin: Secondary | ICD-10-CM

## 2017-03-11 LAB — POC URINALSYSI DIPSTICK (AUTOMATED)
BILIRUBIN UA: NEGATIVE
Blood, UA: NEGATIVE
GLUCOSE UA: NEGATIVE
KETONES UA: NEGATIVE
LEUKOCYTES UA: NEGATIVE
NITRITE UA: NEGATIVE
PH UA: 7.5 (ref 5.0–8.0)
Protein, UA: NEGATIVE
Spec Grav, UA: 1.015 (ref 1.010–1.025)
Urobilinogen, UA: 0.2 E.U./dL
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
## 2017-03-11 NOTE — Progress Notes (Signed)
Pre visit review using our clinic review tool, if applicable. No additional management support is needed unless otherwise documented below in the visit note. 

## 2017-03-11 NOTE — Patient Instructions (Addendum)
  GO TO THE FRONT DESK Schedule your next appointment for a  Physical exam by June 2019

## 2017-03-12 LAB — URINE CULTURE
MICRO NUMBER: 90205454
Result:: NO GROWTH
SPECIMEN QUALITY:: ADEQUATE

## 2017-03-12 LAB — URINALYSIS, ROUTINE W REFLEX MICROSCOPIC
Bilirubin Urine: NEGATIVE
GLUCOSE, UA: NEGATIVE
HGB URINE DIPSTICK: NEGATIVE
KETONES UR: NEGATIVE
Leukocytes, UA: NEGATIVE
Nitrite: NEGATIVE
PH: 8 (ref 5.0–8.0)
Protein, ur: NEGATIVE
Specific Gravity, Urine: 1.015 (ref 1.001–1.03)

## 2017-03-12 NOTE — Assessment & Plan Note (Signed)
Right-sided paresthesias: Right arm > bilateral hands >  right leg > right face Going on for a few weeks, neurological exam normal, I'm somewhat concerned about the headache but this is happening in the context of sleep deprivation and stress due to her husband having surgery.  Headache is now resolved. Will recommend  eval by neurology, has seen Dr. Vickey Hugerohmeier before.  Normal B12 last year. Dysuria:  x 1 day, Udip negative, check a UA urine culture, treat if appropriate RTC June 2019, CPX

## 2017-03-14 ENCOUNTER — Encounter: Payer: Self-pay | Admitting: Family Medicine

## 2017-03-14 ENCOUNTER — Ambulatory Visit: Payer: BC Managed Care – PPO | Admitting: Family Medicine

## 2017-03-14 VITALS — BP 122/82 | HR 66 | Temp 98.1°F | Ht 67.0 in | Wt 181.2 lb

## 2017-03-14 DIAGNOSIS — R6889 Other general symptoms and signs: Secondary | ICD-10-CM

## 2017-03-14 MED ORDER — BENZONATATE 100 MG PO CAPS: 100.0000 mg | ORAL_CAPSULE | Freq: Three times a day (TID) | ORAL | 0 refills | Status: DC | PRN

## 2017-03-14 MED ORDER — OSELTAMIVIR PHOSPHATE 75 MG PO CAPS: 75.0000 mg | ORAL_CAPSULE | Freq: Two times a day (BID) | ORAL | 0 refills | Status: AC

## 2017-03-14 NOTE — Progress Notes (Signed)
Pre visit review using our clinic review tool, if applicable. No additional management support is needed unless otherwise documented below in the visit note. 

## 2017-03-14 NOTE — Patient Instructions (Signed)
Continue to push fluids, practice good hand hygiene, and cover your mouth if you cough.  If you start having increasing fevers, shaking or shortness of breath, seek immediate care.  Ibuprofen 400-600 mg (2-3 over the counter strength tabs) every 6 hours as needed for pain.  OK to take Tylenol 1000 mg (2 extra strength tabs) or 975 mg (3 regular strength tabs) every 6 hours as needed.  Let us know if you need anything.  

## 2017-03-14 NOTE — Progress Notes (Signed)
Chief Complaint  Patient presents with  . Sore Throat    sinus pressure  . Cough    Abigail [name] here for URI complaints.  Duration: 1 day  Associated symptoms: subjective fever, sinus congestion, rhinorrhea, sore throat, myalgia and cough Denies: sinus pain, itchy watery eyes, ear pain, ear drainage and shortness of breath Treatment to date: Tylenol Sick contacts: No  ROS:  Const: +Subj fevers HEENT: As noted in HPI Lungs: No SOB  Past Medical History:  Diagnosis Date  . Asthma   . Chiari malformation type I (HCC)   . Diabetes mellitus   . GERD (gastroesophageal reflux disease)   . Hx of gout   . Hyperlipidemia   . Hyperparathyroidism (HCC)   . Hypertension   . Lumbar radiculopathy   . Postablative hypothyroidism   . Sickle cell anemia (HCC)   . Spondylolysis   . Thyroid disease    Family History  Problem Relation Age of Onset  . Hypertension Sister        sister, daughter , mother   . Hyperlipidemia Sister   . Thyroid disease Sister   . Diabetes Daughter   . Stroke Mother   . Colon cancer Neg Hx   . Sudden death Neg Hx   . Heart attack Neg Hx   . Breast cancer Neg Hx   . Stomach cancer Neg Hx     BP 122/82 (BP Location: Left Arm, Patient Position: Sitting, Cuff Size: Normal)   Pulse 66   Temp 98.1 F (36.7 C) (Oral)   Ht 5\' 7"  (1.702 m)   Wt 181 lb 4 oz (82.2 kg)   SpO2 99%   BMI 28.39 kg/m  General: Awake, alert, appears stated age HEENT: AT, [place], ears patent b/l and TM's neg, nares patent w/o discharge, pharynx pink and without exudates, MMM Neck: No masses or asymmetry Heart: RRR Lungs: CTAB, no accessory muscle use Psych: Age appropriate judgment and insight, normal mood and affect  Flu-like symptoms - Plan: benzonatate (TESSALON) 100 MG capsule, oseltamivir (TAMIFLU) 75 MG capsule  Orders as above.  Continue to push fluids, practice good hand hygiene, cover mouth when coughing. F/u prn. If starting to experience fevers, shaking,  or shortness of breath, seek immediate care. Pt voiced understanding and agreement to the plan.  Jilda Rocheicholas [place], DO 03/14/17 4:34 PM

## 2017-03-17 ENCOUNTER — Ambulatory Visit: Payer: BC Managed Care – PPO | Admitting: Internal Medicine

## 2017-03-18 ENCOUNTER — Ambulatory Visit: Payer: BC Managed Care – PPO | Admitting: Internal Medicine

## 2017-03-23 NOTE — Telephone Encounter (Signed)
Error

## 2017-04-21 ENCOUNTER — Ambulatory Visit: Payer: BC Managed Care – PPO | Admitting: Internal Medicine

## 2017-05-20 ENCOUNTER — Other Ambulatory Visit: Payer: Self-pay | Admitting: Internal Medicine

## 2017-05-20 NOTE — Telephone Encounter (Signed)
Last ov 11/23/16 no upcoming, please advise on refill

## 2017-05-26 ENCOUNTER — Other Ambulatory Visit: Payer: Self-pay

## 2017-05-26 MED ORDER — METFORMIN HCL ER 500 MG PO TB24: 500.0000 mg | ORAL_TABLET | Freq: Three times a day (TID) | ORAL | 0 refills | Status: DC

## 2017-05-27 ENCOUNTER — Encounter: Payer: Self-pay | Admitting: Internal Medicine

## 2017-05-27 ENCOUNTER — Ambulatory Visit: Payer: BC Managed Care – PPO | Admitting: Internal Medicine

## 2017-05-27 ENCOUNTER — Ambulatory Visit (HOSPITAL_BASED_OUTPATIENT_CLINIC_OR_DEPARTMENT_OTHER)
Admission: RE | Admit: 2017-05-27 | Discharge: 2017-05-27 | Disposition: A | Discharge status: Home / Self Care | Payer: BC Managed Care – PPO | Source: Ambulatory Visit | Attending: Internal Medicine | Admitting: Internal Medicine

## 2017-05-27 VITALS — BP 134/70 | HR 71 | Temp 97.7°F | Resp 14 | Ht 67.0 in | Wt 184.4 lb

## 2017-05-27 DIAGNOSIS — M2550 Pain in unspecified joint: Secondary | ICD-10-CM | POA: Diagnosis not present

## 2017-05-27 DIAGNOSIS — R202 Paresthesia of skin: Secondary | ICD-10-CM

## 2017-05-27 DIAGNOSIS — R5382 Chronic fatigue, unspecified: Secondary | ICD-10-CM | POA: Diagnosis not present

## 2017-05-27 DIAGNOSIS — R21 Rash and other nonspecific skin eruption: Secondary | ICD-10-CM | POA: Diagnosis not present
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
## 2017-05-27 NOTE — Progress Notes (Signed)
Pre visit review using our clinic review tool, if applicable. No additional management support is needed unless otherwise documented below in the visit note. 

## 2017-05-27 NOTE — Patient Instructions (Addendum)
GO TO THE LAB : Get the blood work     GO TO THE FRONT DESK Schedule your next appointment for a checkup in 3 months (physical)   STOP BY THE FIRST FLOOR:  get the XR    Please see the neurologist

## 2017-05-28 ENCOUNTER — Other Ambulatory Visit: Payer: Self-pay | Admitting: Internal Medicine

## 2017-05-28 NOTE — Assessment & Plan Note (Signed)
Multiple problems including Right-sided paresthesias : Ongoing, again recommend to see neurology Arthralgias of the hands, wrists, knees, right shoulder: She reports prolonged stiffness of the hands, no synovitis on exam, DDX include DJD, inflammatory arthritis and others.  Will get hand x-rays, ANA, rheumatoid factor and sed rate.  Last TSH and B12 satisfactory.  If w/u is (-), sx could be related to FM or somatization see next Fatigue, ongoing issue, under a lot of stress due to her husband illness.  Her daughter used to help her but she went back to [place].  I believe she needs counseling, she is also advised to keep some time  for herself for rest, meditation, yoga, etc.  Needs to prevent caregiver burnout.  She has a number of physical complaints, FM?  Somatization?. Rash: Etiology unclear, no acute rash apparently, rec observation. Varicose veins: Has generalized small varicose veins at the lower extremities without phlebitis.  Rx observation for now, needs to work on other problems. RTC 3 months (physical)

## 2017-05-30 LAB — SEDIMENTATION RATE: SED RATE: 9 mm/h (ref 0–30)

## 2017-05-30 LAB — RHEUMATOID FACTOR

## 2017-05-30 LAB — ANA: Anti Nuclear Antibody(ANA): NEGATIVE

## 2017-06-23 ENCOUNTER — Telehealth: Payer: Self-pay | Admitting: Internal Medicine

## 2017-06-23 NOTE — Telephone Encounter (Signed)
Pt given results per notes of Dr. Drue Novel on 06/01/17. Unable to document in result note due to result note not being routed to [facility].  Notes recorded by Wanda Plump, MD on 06/01/2017 at 5:15 PM EDT Release these results to MyChart with attached comments Abigail [name], your blood work came back very good.  Notes recorded by Wanda Plump, MD on 05/30/2017 at 9:44 AM EDT Release these results to MyChart with attached comments Your hand x-rays are normal. Good results

## 2017-06-23 NOTE — Telephone Encounter (Signed)
Copied from CRM [id number]. Topic: Quick Communication - See Telephone Encounter >> Jun 23, 2017  9:27 AM Arlyss Gandy, NT wrote: CRM for notification. See Telephone encounter for: 06/23/17. Pt would like a call with her lab and x-ray results. She states she is still having a lot of pain. She states to please call her after 1:30pm.

## 2017-06-23 NOTE — Telephone Encounter (Signed)
Tried calling Pt- no answer, unable to leave message; voicemail box is full. PCP released results of labs and x-rays to MyChart for Pt to see (all normal). Okay for PEC to discuss when she returns call.

## 2017-06-24 ENCOUNTER — Telehealth: Payer: Self-pay | Admitting: Internal Medicine

## 2017-06-24 ENCOUNTER — Other Ambulatory Visit: Payer: Self-pay

## 2017-06-24 MED ORDER — METFORMIN HCL ER 500 MG PO TB24: 500.0000 mg | ORAL_TABLET | Freq: Three times a day (TID) | ORAL | 0 refills | Status: DC

## 2017-06-24 NOTE — Telephone Encounter (Signed)
Per [facility]- patient states she needs her prescription to be 90 days per pharmacy. Metformin ER 500 MG is needed. Out of medication

## 2017-06-24 NOTE — Telephone Encounter (Signed)
This was sent thus morning to [facility]

## 2017-06-24 NOTE — Telephone Encounter (Signed)
Patient need a new prescription for metformin 500 mg  [id number] - [place], [place] - [address] AT [address] & [address] DEA #:  [id number]

## 2017-06-24 NOTE — Telephone Encounter (Signed)
Sent to pharmacy 

## 2017-07-20 ENCOUNTER — Telehealth: Payer: Self-pay | Admitting: Neurology

## 2017-07-20 ENCOUNTER — Ambulatory Visit: Payer: BC Managed Care – PPO | Admitting: Internal Medicine

## 2017-07-20 ENCOUNTER — Encounter: Payer: Self-pay | Admitting: Internal Medicine

## 2017-07-20 VITALS — BP 122/64 | HR 85 | Temp 98.1°F | Resp 16 | Ht 67.0 in | Wt 185.4 lb

## 2017-07-20 DIAGNOSIS — M2550 Pain in unspecified joint: Secondary | ICD-10-CM

## 2017-07-20 DIAGNOSIS — G935 Compression of brain: Secondary | ICD-10-CM

## 2017-07-20 DIAGNOSIS — R202 Paresthesia of skin: Secondary | ICD-10-CM

## 2017-07-20 NOTE — Patient Instructions (Signed)
GO TO THE LAB : Get the blood work     GO TO THE FRONT DESK Schedule your next appointment for a follow-up in 6 weeks  I asked neurology to see you as soon as possible.  Please expect a phone call  I asked MRI of your brain and neck as soon as possible.  Please expect a phone call.  Call or ER if you get much worse in the next 3 or 4 days

## 2017-07-20 NOTE — Telephone Encounter (Signed)
Kathie RhodesBetty and Diane: I spoke with her pcp today Dr. Drue NovelPaz. He is sending in a new referral for a new problem with patient, she is feeling weak. He would like her to be seen by Dr. Vickey Hugerohmeier (Dr. Vickey Hugerohmeier saw her in 2017). Would you schedule this patient with Dr. Algis Downs when you get the consult an dlet me know the date? Thank you. [facility]ethany fyi thanks)
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
## 2017-07-21 LAB — BASIC METABOLIC PANEL
BUN: 10 mg/dL (ref 6–23)
CALCIUM: 9.2 mg/dL (ref 8.4–10.5)
CO2: 31 meq/L (ref 19–32)
Chloride: 102 mEq/L (ref 96–112)
Creatinine, Ser: 0.88 mg/dL (ref 0.40–1.20)
GFR: 82.82 mL/min (ref >60.00)
Glucose, Bld: 98 mg/dL (ref 70–99)
Potassium: 4.7 mEq/L (ref 3.5–5.1)
SODIUM: 140 meq/L (ref 135–145)

## 2017-07-21 LAB — CK: Total CK: 158 U/L (ref 7–177)

## 2017-07-21 LAB — B12 AND FOLATE PANEL: FOLATE: 15.5 ng/mL (ref >5.9)

## 2017-07-21 NOTE — Telephone Encounter (Signed)
sched apt with pt on the phone for 08/09/17 with Dr. Vickey Hugerohmeier

## 2017-07-21 NOTE — Assessment & Plan Note (Signed)
Right-sided paresthesias: now also w/ weakness: Ongoing problem, onset ~ 02-2017, has not been able to see neurology.  She has a history of Chiari malformation type I, had surgery 2013.  Symptoms are now  more noticeable; she continue with associated with generalized arthralgias and subjective > objective stiffness which made the clinical picture somewhat confusing. My concern is if the right sided paresthesias are related to a serious conditions such as myelopathy.    I spoke with neurology Dr. Lucia GaskinsAhern, I asked for prompt evaluation, I appreciate her help. MRI of the brain cervical spine ASAP Also get a BMP, total CK, B12 folic acid vitamin D for completeness.  Previously a sed rate was normal ER if worse

## 2017-07-22 ENCOUNTER — Encounter: Payer: BC Managed Care – PPO | Admitting: Internal Medicine

## 2017-07-23 ENCOUNTER — Ambulatory Visit (HOSPITAL_BASED_OUTPATIENT_CLINIC_OR_DEPARTMENT_OTHER)
Admission: RE | Admit: 2017-07-23 | Discharge: 2017-07-23 | Disposition: A | Discharge status: Home / Self Care | Payer: BC Managed Care – PPO | Source: Ambulatory Visit | Attending: Internal Medicine | Admitting: Internal Medicine

## 2017-07-23 DIAGNOSIS — R202 Paresthesia of skin: Secondary | ICD-10-CM | POA: Diagnosis not present

## 2017-07-23 DIAGNOSIS — G935 Compression of brain: Secondary | ICD-10-CM | POA: Diagnosis present

## 2017-07-23 DIAGNOSIS — M50321 Other cervical disc degeneration at C4-C5 level: Secondary | ICD-10-CM | POA: Diagnosis not present

## 2017-07-23 DIAGNOSIS — M47812 Spondylosis without myelopathy or radiculopathy, cervical region: Secondary | ICD-10-CM | POA: Insufficient documentation

## 2017-07-24 LAB — VITAMIN D 1,25 DIHYDROXY
Vitamin D 1, 25 (OH)2 Total: 34 pg/mL (ref 18–72)
Vitamin D2 1, 25 (OH)2: <8 pg/mL
Vitamin D3 1, 25 (OH)2: 34 pg/mL

## 2017-08-02 ENCOUNTER — Ambulatory Visit: Payer: BC Managed Care – PPO | Admitting: Internal Medicine

## 2017-08-02 ENCOUNTER — Encounter: Payer: Self-pay | Admitting: Internal Medicine

## 2017-08-02 VITALS — BP 132/68 | HR 69 | Temp 97.8°F | Resp 16 | Ht 67.0 in | Wt 184.2 lb

## 2017-08-02 DIAGNOSIS — M546 Pain in thoracic spine: Secondary | ICD-10-CM

## 2017-08-02 NOTE — Progress Notes (Signed)
Pre visit review using our clinic review tool, if applicable. No additional management support is needed unless otherwise documented below in the visit note. 

## 2017-08-02 NOTE — Patient Instructions (Addendum)
Continue diclofenac 1 tablet daily.  Take it with food.  Watch for stomach pain, nausea or change in the color of the stools  Tylenol  500 mg OTC 2 tabs a day every 8 hours as needed for pain  Call if not gradually improving  Stretch the area gradually once you feel better
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
## 2017-08-03 NOTE — Assessment & Plan Note (Signed)
Acute thoracic pain: In the context of heavy lifting, she has some diclofenac at home, she took 1 tablet yesterday and improved. Plan: Diclofenac with GI precautions once daily; also Tylenol, stretching, heating pad, call if not better.  See instructions, they were discussed in Spanish with the patient who verbalized understanding Right-sided paresthesias: Since the last time, MRI of the brain and cervical spine showed no acute findings.  Blood work was negative.  Today she reports to continue with right-sided paresthesias but in addition she also have left-sided paresthesias.  Has a neurology appointment in few days.  Encouraged to discuss with them.   I printed a copy of her MRI today and we discussed the results at pt request.

## 2017-08-09 ENCOUNTER — Ambulatory Visit: Payer: BC Managed Care – PPO | Admitting: Neurology

## 2017-08-09 ENCOUNTER — Encounter: Payer: Self-pay | Admitting: Neurology

## 2017-08-09 VITALS — BP 116/62 | HR 65 | Ht 67.0 in | Wt 185.0 lb

## 2017-08-09 DIAGNOSIS — F329 Major depressive disorder, single episode, unspecified: Secondary | ICD-10-CM | POA: Diagnosis not present

## 2017-08-09 DIAGNOSIS — R6889 Other general symptoms and signs: Secondary | ICD-10-CM | POA: Diagnosis not present

## 2017-08-09 DIAGNOSIS — R2689 Other abnormalities of gait and mobility: Secondary | ICD-10-CM

## 2017-08-09 DIAGNOSIS — M25511 Pain in right shoulder: Secondary | ICD-10-CM | POA: Diagnosis not present

## 2017-08-09 MED ORDER — BUPROPION HCL ER (SR) 150 MG PO TB12: 150.0000 mg | ORAL_TABLET | Freq: Every day | ORAL | 3 refills | Status: DC

## 2017-08-09 NOTE — Progress Notes (Signed)
SLEEP MEDICINE CLINIC   Provider:  Melvyn Novas, M D  Referring Provider: Wanda Plump, MD Primary Care Physician:  Wanda Plump, MD  Chief Complaint  Patient presents with  . Follow-up    2018 pain and weakness on right side.she developed numbness and tingling in feb it all started on the right side from arm to hip and foot, as time went on she developed on left side as well. she says affects mostly right side. she says it takes a longer time getting ready she states she is unable to get ready.. pt husband is in nursing home and her daughter has had to go home. pt became tearful when talking about her husband.     HPI:  Abigail [name] is a 67 y.o. female , revisit today on 07-30-2017 , for a new problem. I have known Abigail [name] for over 5 years through her husband Abigail [name]. Abigail [name], a brittle diabetic ,  developed dementia with personality changes,  and diabetes related vascular disease, lost his right leg, BKA and the left hallux was amputated, now forefoot. He is incontinent.  He is losing vision - retinopathy of diabetic origin. He had to move to a facility. His daughter lives in [place], and had come for two 90 day visits in 2018, but is now not allowed to return to help.  She is quite tearful as she reports this dilemma, she is clearly depressed and feels she has not done all she needs for him. - she is here for ill defined pains and discomforts. Right shoulder pain, neck stiffness, hand pain and due to that multiple imaging studies were performed.  Dr. Drue Novel provided the results and I was able to review the studies in her presence.     2017 : originally seen here as a referral from Dr. Drue Novel for a sleep evaluation.  Abigail [name] is the main caretaker of her husband , who suffers from uncontrolled diabetes and progressive dementia and now requieres supervision and assistance in many AOLs. 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 She reports that after a usual day at work she prepares all meals does all the housekeeping and usually is in bed not earlier than midnight only to rise again at 4 AM and preparing her breakfast and setting the table etc. for her husband before she leaves for work. He stays home all day. He stays home alone. It will take her hours to get her husband into the shower, to get him dressed to get him motivated which is coached to do any activity and not stay in bed. She is afraid of the school year starting again as she doesn't know what to do about her husband's passivity. He is also very easily confused and distracted and he gets lost within the confines of his own home. In short, I think Abigail [name] fatigue is sufficiently explained by the social situation. The dementia has advanced very fast unexpectedly fast. His diabetes treatment also recently changed his insurance changed the available medication from insulin Lantus to another medication to which she seems not to respond very well. I have suggested an adult center for enrichment or other adult day care facility and apparently the couple has visited the one on [address] but Mr. Laverle Patter was not very sure that he wants to  be there. And his wife also agrees that it was a depressing visit.  Chief complaint according to patient : "I am overwhelmed "  Sleep medical history and family sleep history: Sickle cell trait.  Gastroesophageal reflux disease, history of gout, hyperlipidemia, post ablative hypoparathyroidism and thyroid disease, spondylosis, degenerative disc disease, asthma, Chiari malformation type I and diabetes mellitus.  Social history: Abigail [name] was the main caretaker for her husband who has multiple medical problems which has culminated in cognitive and physical disabilities.  She also had to lift, to bend, to help him dress, he was  incontinent, and the caretaking duties were on top of her full-time employment.  Abigail [name] has now moved into a facility which and burdens her physically but she is mentally still very much affected by this.  Married to a retired Engineer, site from [place]. He is a Control and instrumentation engineer and works as a Psychologist, forensic in [employer].  One daughter age 86 . 45 years of marriage.  Master's degree , was working on PhD.   Review of Systems: Out of a complete 14 system review, the patient complains of only the following symptoms, and all other reviewed systems are negative. Her husband a dry though reported that she sometimes snores, we have no more reliable account of her sleeping. She does not wake up gasping for air she is usually not having palpitations she's not diaphoretic she does not have frequent nocturia and external circumstances limit her ability to sleep.   Epworth score  7, Fatigue severity score 25  , depression score 3/15    Social History   Socioeconomic History  . Marital status: Married    Spouse name: Not on file  . Number of children: 1  . Years of education: Not on file  . Highest education level: Not on file  Occupational History  . Occupation: Runner, broadcasting/film/video , high school  Social Needs  . Financial resource strain: Not on file  . Food insecurity:    Worry: Not on file    Inability: Not on file  . Transportation needs:    Medical: Not on file    Non-medical: Not on file  Tobacco Use  . Smoking status: Never Smoker  . Smokeless tobacco: Never Used  Substance and Sexual Activity  . Alcohol use: No  . Drug use: No  . Sexual activity: Never    Partners: Male    Comment: 1st intercourse- 62, married- 46 yrs   Lifestyle  . Physical activity:    Days per week: Not on file    Minutes per session: Not on file  . Stress: Not on file  Relationships  . Social connections:    Talks on phone: Not on file    Gets together: Not on file    Attends religious service: Not on  file    Active member of club or organization: Not on file    Attends meetings of clubs or organizations: Not on file    Relationship status: Not on file  . Intimate partner violence:    Fear of current or ex partner: Not on file    Emotionally abused: Not on file    Physically abused: Not on file    Forced sexual activity: Not on file  Other Topics Concern  . Not on file  Social History Narrative   Original from [place]   Married x 44 years   1 daughter     Family History  Problem Relation Age of Onset  .  Hypertension Sister        sister, daughter , mother   . Hyperlipidemia Sister   . Thyroid disease Sister   . Diabetes Daughter   . Stroke Mother   . Colon cancer Neg Hx   . Sudden death Neg Hx   . Heart attack Neg Hx   . Breast cancer Neg Hx   . Stomach cancer Neg Hx     Past Medical History:  Diagnosis Date  . Asthma   . Chiari malformation type I (HCC)   . Diabetes mellitus   . GERD (gastroesophageal reflux disease)   . Hx of gout   . Hyperlipidemia   . Hyperparathyroidism (HCC)   . Hypertension   . Lumbar radiculopathy   . Postablative hypothyroidism   . Sickle cell anemia (HCC)   . Spondylolysis   . Thyroid disease     Past Surgical History:  Procedure Laterality Date  . ABDOMINAL HYSTERECTOMY  1996   NO oophorectomy  . BREAST BIOPSY Left 2015   [facility]   . CERVICAL LAMINECTOMY     at C1 w/ duraplasty  . CRANIECTOMY SUBOCCIPITAL W/ CERVICAL LAMINECTOMY / CHIARI  07/05/2011   At C1, performed at [facility]  . [facility]ECTOMY  2015   [facility]     Current Outpatient Medications  Medication Sig Dispense Refill  . acetaminophen (TYLENOL) 500 MG tablet Take 1,000 mg by mouth every 6 (six) hours as needed.    Marland Kitchen aspirin 81 MG tablet Take 81 mg by mouth daily.      . COLLAGEN PO Take by mouth. Take as directed    . glucose blood (ONE TOUCH ULTRA TEST) test strip Reported on 07/09/2015    . Magnesium Salicylate 325 MG TABS Take 325 mg by mouth daily.    .  metFORMIN (GLUCOPHAGE-XR) 500 MG 24 hr tablet Take 1 tablet (500 mg total) by mouth 3 (three) times daily with meals. 270 tablet 0  . Potassium 99 MG TABS Take by mouth.    . diclofenac (CATAFLAM) 50 MG tablet Take 100 mg by mouth daily.     No current facility-administered medications for this visit.     Allergies as of 08/09/2017 - Review Complete 08/09/2017  Allergen Reaction Noted  . Pollen extract Itching 04/19/2013  . Citrus Itching, Rash, and Swelling 12/06/2014  . Ibuprofen Swelling and Rash 11/12/2010  . Other Itching, Rash, and Swelling 12/06/2014    Vitals: BP 116/62   Pulse 65   Ht 5\' 7"  (1.702 m)   Wt 185 lb (83.9 kg)   BMI 28.98 kg/m  Last Weight:  Wt Readings from Last 1 Encounters:  08/09/17 185 lb (83.9 kg)   QIO:NGEX mass index is 28.98 kg/m.     Last Height:   Ht Readings from Last 1 Encounters:  08/09/17 5\' 7"  (1.702 m)    Physical exam:  General: The patient is awake, alert and appears not in acute distress. The patient is well groomed. Head: Normocephalic, atraumatic. Neck is supple. Mallampati 1,  neck circumference:14.75 . Nasal airflow patent  Cardiovascular:  Regular rate and rhythm , without  murmurs or carotid bruit, and without distended neck veins. Respiratory: Lungs are clear to auscultation. Skin:  Without evidence of edema, or rash Trunk: BMI 29. The patient's posture is erect   Neurologic exam : The patient is awake and alert, oriented to place and time.   Attention span & concentration ability appears normal.  Speech is fluent,  without dysarthria, dysphonia or aphasia.  Mood and affect are appropriate.  Cranial nerves: Pupils are equal and briskly reactive to light. Funduscopic exam without  evidence of pallor or edema.  Extraocular movements  in vertical and horizontal planes intact and without nystagmus. Visual fields by finger perimetry are intact. Hearing to finger rub intact. Facial sensation intact to fine touch.Facial motor  strength is symmetric and tongue and uvula move midline. Shoulder shrug was symmetrical.   Motor exam:  Normal tone, muscle bulk and symmetric strength in all extremities. Her ROM over the right shoulder is limited due to pain, there is crackling. She has bilateral weakness of pinch and grip.   Sensory:  Fine touch, pinprick and vibration were intact in both feet - Proprioception tested in the upper extremities was normal. Coordination: Rapid alternating movements / Finger-to-nose maneuver without evidence of ataxia, dysmetria or tremor.  Gait and station: Patient walks with a cane ( her husbands cane ) as assistive device and is unable to stand up from a seated position, she needed to brace herself .  Strength (may be pain related) decreased in general  Stance is stable and normal. She walks shuffling with lacing the left leg first and sliding the right leg after. Barely lifts her feet. Toe and heel stand were deferred. Tandem gait deferred,  Turns with 3 -4 Steps. Romberg testing is negative.  Deep tendon reflexes: in the upper and lower extremities are symmetric and intact. Babinski maneuver response is downgoing.   An MRI of the cervical spine was obtained on 23 July 2017, resulting in cervical spondylosis, Arnold-Chiari 1 malformation prior suboccipital decompression compression surgery no syrinx is identified, there is a mild impingement of C3-4 in comparison to a 2012 exam, and there is a moderate impingement at C3-4.  Mild impingement also between fourth and fifth cervical vertebrae fifth and 6th, and 6 and 7th. Bilateral hand x-rays were negative obtained in early May 2019. MRI of the brain was obtained on 07-23-2017, MRI noncontrast showed normal flow voids, no acute intracranial abnormality, chronic postsurgical changes in relation to the Chiari I malformation surgery with suboccipital craniectomy and C1 laminectomy.  There are some hyperintense foci in the bilateral frontal lobe white  matter this is chronic microvascular disease.  None of this explains the diffuse body pain weakness for the last 4 months. The patient was advised of the nature of the diagnosed sleep disorder , the treatment options and risks for general a health and wellness arising from not treating the condition.    I spent more than 60  minutes of face to face time with the patient. Greater than 50% of time was spent in counseling and coordination of care. We have discussed the diagnosis and differential and I answered the patient's questions.     Assessment:   After physical and neurologic examination, review of laboratory studies,  Personal review of imaging studies, reports of other /same  Imaging studies ,  Results of polysomnography/ neurophysiology testing and pre-existing records as far as provided in visit., my assessment is: ANA. CK, TSH, Vit D and Chem panel were negative, RF negative .      1) Abigail [name] is seen here today as a patient - I usually see her husband.  Generalized pain not skeletal in origin, I suspect a functional component,  I recommend gait stability exercises with PT , and massage therapy to loosen deep tissue.  I will refer to Dr Darrick Penna, Sport medicine at [facility] for an [place] evaluation of  the right shoulder - seems to be ROM restricted.   2) depression treatment - needed ASAP;  Wellbutrin/ Zoloft - needs a weight neutral option.   3) I recommend to participate in a support group for caretakers of demented patients.    Plan:  Treatment plan and additional workup :  RV prn.  Wellbutrin 150 mg in AM.  Refer to PT, sports medicine.    Porfirio Mylararmen Nikita Humble MD  08/09/2017   CC: Wanda PlumpPaz, Jose E, Md [address] [place], [place] [id number]

## 2017-08-12 ENCOUNTER — Ambulatory Visit: Payer: BC Managed Care – PPO | Admitting: Internal Medicine

## 2017-08-12 ENCOUNTER — Encounter: Payer: Self-pay | Admitting: Internal Medicine

## 2017-08-12 VITALS — BP 144/80 | HR 72 | Ht 67.0 in | Wt 184.8 lb

## 2017-08-12 DIAGNOSIS — E559 Vitamin D deficiency, unspecified: Secondary | ICD-10-CM

## 2017-08-12 DIAGNOSIS — E210 Primary hyperparathyroidism: Secondary | ICD-10-CM

## 2017-08-12 DIAGNOSIS — E119 Type 2 diabetes mellitus without complications: Secondary | ICD-10-CM | POA: Diagnosis not present

## 2017-08-12 LAB — POCT GLYCOSYLATED HEMOGLOBIN (HGB A1C): HEMOGLOBIN A1C: 6.6 % — AB (ref 4.0–5.6)

## 2017-08-12 LAB — LIPID PANEL
CHOL/HDL RATIO: 3
Cholesterol: 178 mg/dL (ref 0–200)
HDL: 65.3 mg/dL (ref >39.00)
LDL CALC: 98 mg/dL (ref 0–99)
NONHDL: 112.76
Triglycerides: 73 mg/dL (ref 0.0–149.0)
VLDL: 14.6 mg/dL (ref 0.0–40.0)

## 2017-08-12 LAB — VITAMIN D 25 HYDROXY (VIT D DEFICIENCY, FRACTURES): VITD: 14.17 ng/mL — AB (ref 30.00–100.00)

## 2017-08-12 MED ORDER — METFORMIN HCL ER 500 MG PO TB24: 500.0000 mg | ORAL_TABLET | Freq: Three times a day (TID) | ORAL | 3 refills | Status: DC

## 2017-08-12 NOTE — Patient Instructions (Signed)
Patient Instructions  Please continue Metformin ER 500 mg 3x a day.   Please return in 6 months with your sugar log.

## 2017-08-12 NOTE — Progress Notes (Signed)
Patient ID: Abigail [name], female   DOB: 1950/04/03, 67 y.o.   MRN: [id number]  HPI: Abigail [name] is a 67 y.o.-year-old female, returning for f/u for DM2, dx in ~2010, non-insulin-dependent, controlled, without complications and h/o primary HPTH.  She is the wife of [name], also my patient. Last visit 9 mo ago.  Since last visit, her husband, who has dementia, had a BKA and is now in a nursing home.  He will be there indefinitely.  She feels less stressed, and feels that she can take care of herself more than before.  Last hemoglobin A1c was: Lab Results  Component Value Date   HGBA1C 6.5 11/23/2016   HGBA1C 6.7 05/12/2016   HGBA1C 6.5 10/23/2015   Pt is on a regimen of: - Metformin ER 500 mg 2x >> 3X a day  Pt checks her sugars 1x a day - in am:  - am:   93-131, 134 >> 103-151, 155 (fruit at night) - 2h after b'fast: 147 >> n/c >> 96, 149, 159 >> n/c - before lunch: n/c >> 105, 126 >> n/c  - 2h after lunch:117, 175 >> 140s >> n/c - before dinner:n/c >> 100 >> 114, 184 >> n/c - 2h after dinner:  144 >> n/c >> 189 (ate late) >> n/c - bedtime: n/c >> 132 >> n/c - nighttime: n/c Lowest sugar was 87 >> 93 >> 103; she has hypoglycemia awareness in the 80s. Highest sugar was  189 >> 155.  Glucometer: ReliOn  Pt's meals are: - Breakfast: Fruit, eggs - Lunch: chicken/fish + veggies - no red meat  - Dinner: veggies + fruit +/- chicken/fish - Snacks: cashews, fruit (mango)  -No CKD, last BUN/creatinine:  Lab Results  Component Value Date   BUN 10 07/20/2017   CREATININE 0.88 07/20/2017   -No HL; last set of lipids: Lab Results  Component Value Date   CHOL 177 07/21/2016   HDL 61.60 07/21/2016   LDLCALC 98 07/21/2016   TRIG 85.0 07/21/2016   CHOLHDL 3 07/21/2016  She is not on a statin. - last eye exam was in 11/2016: No DR - no numbness and tingling in her feet.   H/o primary hyperparathyroidism  - s/p 2x parathyroid glands resected in 2015,  with resolution of her hypercalcemia She occasionally takes Tums x1 but only when she feels weak, not on a daily basis.  Latest calcium levels normal Lab Results  Component Value Date   CALCIUM 9.2 07/20/2017   CALCIUM 9.1 07/21/2016   CALCIUM 8.8 02/20/2016   CALCIUM 9.1 12/22/2015   CALCIUM 9.2 07/09/2015   CALCIUM 9.2 01/02/2015   CALCIUM 10.4 05/03/2006   ROS: Constitutional: no weight gain/no weight loss, no fatigue, no subjective hyperthermia, no subjective hypothermia, + nocturia Eyes: no blurry vision, no xerophthalmia ENT: no sore throat, no dysphagia, no odynophagia, no hoarseness Cardiovascular: no CP/no SOB/no palpitations/+ leg swelling Respiratory: no cough/no SOB/no wheezing Gastrointestinal: no N/no V/no D/no C/+ acid reflux Musculoskeletal: + Muscle aches/+ joint aches Skin: no rashes, no hair loss Neurological: no tremors/no numbness/no tingling/no dizziness  I reviewed pt's medications, allergies, PMH, social hx, family hx, and changes were documented in the history of present illness. Otherwise, unchanged from my initial visit note.   Past Medical History:  Diagnosis Date  . Asthma   . Chiari malformation type I (HCC)   . Diabetes mellitus   . GERD (gastroesophageal reflux disease)   . Hx of gout   . Hyperlipidemia   .  Hyperparathyroidism (HCC)   . Hypertension   . Lumbar radiculopathy   . Postablative hypothyroidism   . Sickle cell anemia (HCC)   . Spondylolysis   . Thyroid disease    Past Surgical History:  Procedure Laterality Date  . ABDOMINAL HYSTERECTOMY  1996   NO oophorectomy  . BREAST BIOPSY Left 2015   [facility]   . CERVICAL LAMINECTOMY     at C1 w/ duraplasty  . CRANIECTOMY SUBOCCIPITAL W/ CERVICAL LAMINECTOMY / CHIARI  07/05/2011   At C1, performed at [facility]  . PAR[facility]IDECTOMY  2015   [facility]    Social History   Social History  . Marital Status: Married    Spouse Name: N/A  . Number of Children: 1   Occupational History  .  teacher - high school     Social History Main Topics  . Smoking status: Never Smoker   . Smokeless tobacco: Not on file  . Alcohol Use: No  . Drug Use: No   Social History Narrative   Original from [place]   Married x 44 years   Current Outpatient Medications on File Prior to Visit  Medication Sig Dispense Refill  . acetaminophen (TYLENOL) 500 MG tablet Take 1,000 mg by mouth every 6 (six) hours as needed.    Marland Kitchen aspirin 81 MG tablet Take 81 mg by mouth daily.      Marland Kitchen buPROPion (WELLBUTRIN SR) 150 MG 12 hr tablet Take 1 tablet (150 mg total) by mouth daily. 30 tablet 3  . COLLAGEN PO Take by mouth. Take as directed    . diclofenac (CATAFLAM) 50 MG tablet Take 100 mg by mouth daily.    Marland Kitchen glucose blood (ONE TOUCH ULTRA TEST) test strip Reported on 07/09/2015    . Magnesium Salicylate 325 MG TABS Take 325 mg by mouth daily.    . metFORMIN (GLUCOPHAGE-XR) 500 MG 24 hr tablet Take 1 tablet (500 mg total) by mouth 3 (three) times daily with meals. 270 tablet 0  . Potassium 99 MG TABS Take by mouth.     No current facility-administered medications on file prior to visit.    Allergies  Allergen Reactions  . Pollen Extract Itching  . Citrus Itching, Rash and Swelling  . Ibuprofen Swelling and Rash    Pt now states that she can not take ibuprofen  . Other Itching, Rash and Swelling    seafood. seafood   Family History  Problem Relation Age of Onset  . Hypertension Sister        sister, daughter , mother   . Hyperlipidemia Sister   . Thyroid disease Sister   . Diabetes Daughter   . Stroke Mother   . Colon cancer Neg Hx   . Sudden death Neg Hx   . Heart attack Neg Hx   . Breast cancer Neg Hx   . Stomach cancer Neg Hx    PE: BP (!) 144/80   Pulse 72   Ht 5\' 7"  (1.702 m)   Wt 184 lb 12.8 oz (83.8 kg)   SpO2 97%   BMI 28.94 kg/m  Wt Readings from Last 3 Encounters:  08/12/17 184 lb 12.8 oz (83.8 kg)  08/09/17 185 lb (83.9 kg)  08/02/17 184 lb 4 oz (83.6 kg)   Constitutional:  overweight, in NAD Eyes: PERRLA, EOMI, no exophthalmos ENT: moist mucous membranes, no thyromegaly, no cervical lymphadenopathy Cardiovascular: RRR, No MRG Respiratory: CTA B Gastrointestinal: abdomen soft, NT, ND, BS+ Musculoskeletal: no deformities, strength intact in all 4  Skin: moist, warm, no rashes Neurological: no tremor with outstretched hands, DTR normal in all 4  ASSESSMENT: 1. DM2, non-insulin-dependent, now more controlled, without complications  2. History of hyperparathyroidism -  status post 2 gland parathyroidectomy in 2015   PLAN:  1. Patient with improved diabetes control, on oral regimen with metformin ER only, 3 times a day.  She tolerates the medication well.  Sugars are at or slightly above goal -occasional hypoglycemia after dietary indiscretions. -At this visit, her sugars are at goal or slightly higher in the morning when she eats fruit at night.  I advised her not to stop eating fruit but discussed the need to start some form of exercise to reduce her insulin resistance.  She plans to start walking. -No changes are needed in her regimen today - I suggested to:   Patient Instructions  Please continue Metformin ER 500 mg 3x a day  Please return in 6 months with your sugar log.   - today, HbA1c is 6.6% (slightly higher) - continue checking sugars at different times of the day - check 1x a day, rotating checks - advised for yearly eye exams >> she is not UTD  - we will check a lipid panel today - Return to clinic in 6 mo with sugar log   2. History of primary hyperparathyroidism - s/p parathyroidectomy - Most recent calcium was normal  - she takes calcium (Tums) when she feels weak/tired - We will check a vitamin D level today  Office Visit on 08/12/2017  Component Date Value Ref Range Status  . Cholesterol 08/12/2017 178  0 - 200 mg/dL Final   ATP III Classification       Desirable:  < 200 mg/dL               Borderline High:  200 - 239 mg/dL           High:  > = 161 mg/dL  . Triglycerides 08/12/2017 73.0  0.0 - 149.0 mg/dL Final   Normal:  <096 mg/dLBorderline High:  150 - 199 mg/dL  . HDL 08/12/2017 65.30  >39.00 mg/dL Final  . VLDL 04/54/0981 14.6  0.0 - 40.0 mg/dL Final  . LDL Cholesterol 08/12/2017 98  0 - 99 mg/dL Final  . Total CHOL/HDL Ratio 08/12/2017 3   Final                  Men          Women1/2 Average Risk     3.4          3.3Average Risk          5.0          4.42X Average Risk          9.6          7.13X Average Risk          15.0          11.0                      . NonHDL 08/12/2017 112.76   Final   NOTE:  Non-HDL goal should be 30 mg/dL higher than patient's LDL goal (i.e. LDL goal of < 70 mg/dL, would have non-HDL goal of < 100 mg/dL)  . VITD 08/12/2017 14.17* 30.00 - 100.00 ng/mL Final  . Hemoglobin A1C 08/12/2017 6.6* 4.0 - 5.6 % Final   Vitamin D is low, will start 5000 units daily and check  in 3 months.  Carlus Pavlov, MD PhD [facility] Endocrinology

## 2017-08-17 ENCOUNTER — Ambulatory Visit (INDEPENDENT_AMBULATORY_CARE_PROVIDER_SITE_OTHER): Payer: BC Managed Care – PPO | Admitting: Sports Medicine

## 2017-08-17 ENCOUNTER — Ambulatory Visit
Admission: RE | Admit: 2017-08-17 | Discharge: 2017-08-17 | Disposition: A | Discharge status: Home / Self Care | Payer: BC Managed Care – PPO | Source: Ambulatory Visit | Attending: Sports Medicine | Admitting: Sports Medicine

## 2017-08-17 VITALS — BP 155/59 | Ht 67.0 in | Wt 182.0 lb

## 2017-08-17 DIAGNOSIS — M25612 Stiffness of left shoulder, not elsewhere classified: Secondary | ICD-10-CM | POA: Diagnosis not present

## 2017-08-17 DIAGNOSIS — M62838 Other muscle spasm: Secondary | ICD-10-CM | POA: Diagnosis not present

## 2017-08-17 DIAGNOSIS — M25511 Pain in right shoulder: Secondary | ICD-10-CM

## 2017-08-17 NOTE — Progress Notes (Signed)
HPI  CC: Right shoulder pain  [name] is a 67 year old female with history of type 2 diabetes who presents today for right shoulder pain.  She states the shoulder pain started around 3 weeks ago.  At that time she was taking care of her wheelchair-bound husband.  She states that she was giving him a bath, and she lost control and jerked her right shoulder backwards.  She states that this was the onset of the pain.  She states she does have increasing weakness in the right arm since that time.  She states she is unable to do overhead activities.  The pain is bothering her most at nighttime.  She states she has some numbness and tingling in bilateral hands, but this was going on for many months prior to onset of injury.  She is currently seeing a neurologist for radiculopathy.  States she has been taking diclofenac 100 mg at nighttime, which she states helps her sleep.  Is been taken Tylenol as needed, and states she takes it around 1-2 times a week.  Shortness of breath, chest pain, or headaches.  She states she does have some associated neck tightness.  She also reports some stiffness in her left shoulder, without pain.  Past Injuries: Denies any previous injury to the area Past Surgeries: Denies any previous surgeries to the area Smoking: Denies Family Hx: Patient has no family history of any shoulder related injuries.  Past medical history, medications, allergies reviewed by myself today in clinic.  ROS: Per HPI; in addition no fever, no rash, no additional weakness, no additional numbness, no additional paresthesias, and no additional falls/injury.   Objective: BP (!) 155/59   Ht 5\' 7"  (1.702 m)   Wt 182 lb (82.6 kg)   BMI 28.51 kg/m  Gen: Right-hand-dominant, NAD, well groomed, a/o x3, normal affect.  CV: Well-perfused. Warm.  Resp: Non-labored.  Neuro: Sensation intact throughout. No gross coordination deficits.  Gait: Nonpathologic posture, unremarkable stride  without signs of limp or balance issues.  Shoulder, right: TTP noted at the anterior shoulder. No evidence of bony deformity, asymmetry, or muscle atrophy; Tenderness over long head of biceps (bicipital groove). No TTP at [facility] joint. Patient with active forward flexion at 60 degrees and active Abduction at 60 degrees.  She was able to get to full passive ROM, but with pain.  Strength 4/5 in internal and external rotation. Sensation intact. Peripheral pulses intact.  Special Tests:    - Crossarm test: NEG   - Empty can: Positive   - Hawkins: NEG   - Neer test: Unable to tolerate   - Obrien's test: NEG   - Speeds test: NEG  She is neurovascularly intact.  Assessment and Plan:  1. Right shoulder pain, with acute trauma.  There is concern for full-thickness rotator cuff tear.  Patient with physical and history given by patient today, we are concerned for full-thickness rotator cuff tear.  I will obtain a two-view x-ray of the right shoulder today.  This will most definitely need to be followed up by an MRI of the right shoulder to evaluate rotator cuff.  We will have patient follow-up pending the results of that MRI.  Depending on these results, she will likely need to be referred to orthopedic surgeon for further evaluation.  Regardless, she will need to undergo physical therapy for rehabilitation of shoulder following evaluation.  2. Neck Muscle Spasm, chronic.  Patient continues to have spasms in her bilateral neck muscles.  This is being worked up by her primary care physician and neurologist.  I will order PT at today's visit to work on ROM and stretches.  I will defer additional treatment to primary team.   Alric QuanBlake Dixon, MD [facility] Fellow 08/17/2017 11:53 AM

## 2017-08-23 ENCOUNTER — Telehealth: Payer: Self-pay | Admitting: Sports Medicine

## 2017-08-23 NOTE — Telephone Encounter (Signed)
She needs results of her x-ray from last week and wants to know if she will be needing an mri?  Please call  [phone] if you have any questions.

## 2017-08-24 ENCOUNTER — Ambulatory Visit
Admission: RE | Admit: 2017-08-24 | Discharge: 2017-08-24 | Disposition: A | Discharge status: Home / Self Care | Payer: BC Managed Care – PPO | Source: Ambulatory Visit | Attending: Sports Medicine | Admitting: Sports Medicine

## 2017-08-24 ENCOUNTER — Other Ambulatory Visit: Payer: BC Managed Care – PPO

## 2017-08-24 DIAGNOSIS — M25511 Pain in right shoulder: Secondary | ICD-10-CM

## 2017-08-24 NOTE — Telephone Encounter (Signed)
[place] spoke with patient yesterday afternoon to inform her that we still want her to get an MRI.  We will call her with results of MRI.

## 2017-08-26 ENCOUNTER — Ambulatory Visit (INDEPENDENT_AMBULATORY_CARE_PROVIDER_SITE_OTHER): Payer: BC Managed Care – PPO | Admitting: Internal Medicine

## 2017-08-26 ENCOUNTER — Encounter: Payer: Self-pay | Admitting: Internal Medicine

## 2017-08-26 VITALS — BP 126/74 | HR 65 | Temp 97.5°F | Resp 16 | Ht 67.0 in | Wt 187.2 lb

## 2017-08-26 DIAGNOSIS — Z23 Encounter for immunization: Secondary | ICD-10-CM | POA: Diagnosis not present

## 2017-08-26 DIAGNOSIS — R399 Unspecified symptoms and signs involving the genitourinary system: Secondary | ICD-10-CM | POA: Diagnosis not present

## 2017-08-26 DIAGNOSIS — Z0000 Encounter for general adult medical examination without abnormal findings: Secondary | ICD-10-CM

## 2017-08-26 DIAGNOSIS — Z0001 Encounter for general adult medical examination with abnormal findings: Secondary | ICD-10-CM

## 2017-08-26 NOTE — Assessment & Plan Note (Signed)
--   Td 04-2015; pnm 23: 2016; prevnar today; Shingrix discussed, declined for now  -- Female care: saw gyn 08-2015, PAP-HPV (-), per note no further PAPs, plans to see gyn 10/2017;  last MMG 08/2016; already working on the next MMG --CCS: 07-2012 , no actual records , per pt @ [facility], will try to get records (told to return in 10 years) --T score -0.9 (04-2015) --Labs reviewed, will check a CBC, LFTs, TSH. --Diet and exercise discussed

## 2017-08-26 NOTE — Patient Instructions (Signed)
GO TO THE LAB : Get the blood work     GO TO THE FRONT DESK Schedule your next appointment   for a physical exam in 1 year 
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
## 2017-08-26 NOTE — Progress Notes (Signed)
Pre visit review using our clinic review tool, if applicable. No additional management support is needed unless otherwise documented below in the visit note. 

## 2017-08-27 LAB — URINALYSIS, ROUTINE W REFLEX MICROSCOPIC
BILIRUBIN URINE: NEGATIVE
Glucose, UA: NEGATIVE
Hgb urine dipstick: NEGATIVE
KETONES UR: NEGATIVE
Leukocytes, UA: NEGATIVE
Nitrite: NEGATIVE
Protein, ur: NEGATIVE
SPECIFIC GRAVITY, URINE: 1.011 (ref 1.001–1.03)
pH: 7 (ref 5.0–8.0)

## 2017-08-27 LAB — CBC WITH DIFFERENTIAL/PLATELET
BASOS ABS: 39 {cells}/uL (ref 0–200)
BASOS PCT: 0.7 %
EOS ABS: 258 {cells}/uL (ref 15–500)
Eosinophils Relative: 4.6 %
HCT: 35.5 % (ref 35.0–45.0)
Hemoglobin: 12.1 g/dL (ref 11.7–15.5)
Lymphs Abs: 2363 cells/uL (ref 850–3900)
MCH: 28.9 pg (ref 27.0–33.0)
MCHC: 34.1 g/dL (ref 32.0–36.0)
MCV: 84.9 fL (ref 80.0–100.0)
MONOS PCT: 9.8 %
MPV: 10 fL (ref 7.5–12.5)
NEUTROS PCT: 42.7 %
Neutro Abs: 2391 cells/uL (ref 1500–7800)
PLATELETS: 291 10*3/uL (ref 140–400)
RBC: 4.18 10*6/uL (ref 3.80–5.10)
RDW: 13.8 % (ref 11.0–15.0)
TOTAL LYMPHOCYTE: 42.2 %
WBC: 5.6 10*3/uL (ref 3.8–10.8)
WBCMIX: 549 {cells}/uL (ref 200–950)

## 2017-08-27 LAB — TSH: TSH: 1.77 m[IU]/L (ref 0.40–4.50)

## 2017-08-27 LAB — URINE CULTURE
MICRO NUMBER: 90916343
RESULT: NO GROWTH
SPECIMEN QUALITY:: ADEQUATE

## 2017-08-27 LAB — HEPATIC FUNCTION PANEL
AG Ratio: 1.8 (calc) (ref 1.0–2.5)
ALKALINE PHOSPHATASE (APISO): 76 U/L (ref 33–130)
ALT: 16 U/L (ref 6–29)
AST: 18 U/L (ref 10–35)
Albumin: 4.5 g/dL (ref 3.6–5.1)
BILIRUBIN DIRECT: 0.1 mg/dL (ref 0.0–0.2)
BILIRUBIN INDIRECT: 0.4 mg/dL (ref 0.2–1.2)
GLOBULIN: 2.5 g/dL (ref 1.9–3.7)
TOTAL PROTEIN: 7 g/dL (ref 6.1–8.1)
Total Bilirubin: 0.5 mg/dL (ref 0.2–1.2)

## 2017-08-27 NOTE — Assessment & Plan Note (Signed)
Here for CPX. Right-sided paresthesias: Patient saw neurology 08/09/2017, after reviewing available MRIs of the neck, brain they feel that the symptoms have a functional component. Was referred to sports medicine and neuro rehab.   At the end of the visit reported mild dysuria for a couple of days, will get a UA and urine culture. RTC 1 year

## 2017-08-30 ENCOUNTER — Ambulatory Visit: Payer: BC Managed Care – PPO | Admitting: Internal Medicine

## 2017-08-30 ENCOUNTER — Telehealth: Payer: Self-pay | Admitting: Internal Medicine

## 2017-08-30 MED ORDER — METFORMIN HCL ER 500 MG PO TB24: 500.0000 mg | ORAL_TABLET | Freq: Three times a day (TID) | ORAL | 3 refills | Status: DC

## 2017-08-30 NOTE — Telephone Encounter (Signed)
Patient needs RX's for all medications sent to her new preferred PHARM: [facility] [address] In [place] asap

## 2017-08-30 NOTE — Telephone Encounter (Signed)
Sent her metformin. [address]

## 2017-08-31 ENCOUNTER — Ambulatory Visit (INDEPENDENT_AMBULATORY_CARE_PROVIDER_SITE_OTHER): Payer: BC Managed Care – PPO | Admitting: Sports Medicine

## 2017-08-31 VITALS — BP 132/82 | Ht 67.0 in | Wt 185.0 lb

## 2017-08-31 DIAGNOSIS — M25511 Pain in right shoulder: Secondary | ICD-10-CM

## 2017-08-31 DIAGNOSIS — M24111 Other articular cartilage disorders, right shoulder: Secondary | ICD-10-CM

## 2017-08-31 DIAGNOSIS — M19011 Primary osteoarthritis, right shoulder: Secondary | ICD-10-CM | POA: Diagnosis not present

## 2017-08-31 DIAGNOSIS — M67911 Unspecified disorder of synovium and tendon, right shoulder: Secondary | ICD-10-CM

## 2017-08-31 MED ORDER — METHYLPREDNISOLONE ACETATE 80 MG/ML IJ SUSP
80.0000 mg | Freq: Once | INTRAMUSCULAR | Status: AC
  Administered 2017-08-31: 80 mg via INTRA_ARTICULAR

## 2017-08-31 NOTE — Progress Notes (Signed)
   HPI  CC: Right shoulder pain  Abigail [name] presents for follow-up of right shoulder pain.  She states that since she was last seen her pain is unchanged.  She had an MRI since her last visit, which showed that she had a 0.8 cm undersurface tear of the supraspinatus, with focal full-thickness.  She was also found to have moderate to severe AC joint osteoarthritis.  She is continued to take  diclofenac 100 mg at nighttime.  She is taking Tylenol as needed with some relief.  She states she is having weakness with overhead activity still.  She continues to have pain at nighttime.  States she occasionally has some numbness in her hand at nighttime as well.  See HPI and/or previous note for associated ROS.  Objective: BP 132/82   Ht 5\' 7"  (1.702 m)   Wt 185 lb (83.9 kg)   BMI 28.98 kg/m  Gen: Right-Hand Dominant. NAD, well groomed, a/o x3, normal affect.  CV: Well-perfused. Warm.  Resp: Non-labored.  Neuro: Sensation intact throughout. No gross coordination deficits.   Right shoulder exam: No swelling, erythema, sulcus sign.  Tenderness to palpation over the [facility] joint, tenderness palpation over the anterior shoulder.  Active range of motion limited to 90 degrees and abduction, and limited to 120 degrees forward flexion.  She has full passive range of motion, but with pain at the top of arc.  She has 4 out of 5 strength in abduction, 4 out of 5 strength in internal rotation.  5 out of 5 strength testing throughout rest of testing.  Negative Neer's, negative Hawkins.  Positive speeds test, positive empty can test,  positive crossover test.  INJECTION: Patient was given informed consent, signed copy in the chart. Appropriate time out was taken. Area prepped and draped in usual sterile fashion. 1 cc of methylprednisolone 80 mg/ml plus  4 cc of 1% lidocaine without epinephrine was injected into the subacromial space using a(n) posterior approach.  1 1/2 inch 25 gauge needle used for injection.  The patient tolerated the procedure well. There were no complications. Post procedure instructions were given.  Assessment and plan:  1. Right sided rotator cuff tendinopathy, with 0.8 cm from the back undersurface tear of the anterolateral supraspinatus.  2.  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  Degenerative tearing of anterior labrum  I discussed with patient today treatment options moving forward.  She would like to start with conservative measures with a subacromial shot for some relief.  We will start her physical therapy at this time as well for range of motion of the right shoulder.  I advised her that her shoulder pain is likely multifactorial.  She could possibly benefit from [facility] joint injection if she gets no relief on return visit.  Is also reasonable to have orthotist surgery evaluate her if she is not improved with physical therapy and other conservative measures.  We will bring her back to clinic in 3 weeks for reevaluation.  Alric QuanBlake Shavontae Gibeault, MD [facility] Sports Medicine Fellow 08/31/2017 9:10 AM   CC: Dr. Porfirio OarJos Paz

## 2017-09-13 ENCOUNTER — Encounter: Payer: Self-pay | Admitting: Internal Medicine

## 2017-10-27 LAB — HM MAMMOGRAPHY

## 2017-11-02 ENCOUNTER — Encounter: Payer: Self-pay | Admitting: Obstetrics & Gynecology

## 2017-11-02 ENCOUNTER — Ambulatory Visit: Payer: BC Managed Care – PPO | Admitting: Obstetrics & Gynecology

## 2017-11-02 VITALS — BP 128/84 | Ht 67.0 in | Wt 184.0 lb

## 2017-11-02 DIAGNOSIS — R350 Frequency of micturition: Secondary | ICD-10-CM

## 2017-11-02 DIAGNOSIS — Z780 Asymptomatic menopausal state: Secondary | ICD-10-CM | POA: Diagnosis not present

## 2017-11-02 DIAGNOSIS — Z90710 Acquired absence of both cervix and uterus: Secondary | ICD-10-CM

## 2017-11-02 DIAGNOSIS — R1031 Right lower quadrant pain: Secondary | ICD-10-CM | POA: Diagnosis not present

## 2017-11-02 DIAGNOSIS — Z01419 Encounter for gynecological examination (general) (routine) without abnormal findings: Secondary | ICD-10-CM

## 2017-11-02 NOTE — Progress Notes (Signed)
Abigail [name] 04-29-1950 [id number]   History:    67 y.o. G1P1L1 Married.  Husband has Alzheimer, living in a Care Facility now.  Her daughter had to return to [place].  RP:  Established patient presenting for annual gyn exam   HPI: Status post TAH.  Menopause, well on no hormone replacement therapy.  C/O Rt pelvic pain on-off x 3 weeks.  No vaginal discharge.  Abstinent.  Urine and bowel movements normal.  Breasts normal.  Body mass index 28.82.  Health labs with family physician.  Past medical history,surgical history, family history and social history were all reviewed and documented in the EPIC chart.  Gynecologic History No LMP recorded. Patient has had a hysterectomy. Contraception: status post hysterectomy Last Pap: 08/2016. Results were: Negative Last mammogram: 10/27/17. Results were: Negative Bone Density: 2017 Normal Colonoscopy: 2014  Obstetric History OB History  Gravida Para Term Preterm AB Living  1 1       1   SAB TAB Ectopic Multiple Live Births          1    # Outcome Date GA Lbr Len/2nd Weight Sex Delivery Anes PTL Lv  1 Para     F Vag-Spont        ROS: A ROS was performed and pertinent positives and negatives are included in the history.  GENERAL: No fevers or chills. HEENT: No change in vision, no earache, sore throat or sinus congestion. NECK: No pain or stiffness. CARDIOVASCULAR: No chest pain or pressure. No palpitations. PULMONARY: No shortness of breath, cough or wheeze. GASTROINTESTINAL: No abdominal pain, nausea, vomiting or diarrhea, melena or bright red blood per rectum. GENITOURINARY: No urinary frequency, urgency, hesitancy or dysuria. MUSCULOSKELETAL: No joint or muscle pain, no back pain, no recent trauma. DERMATOLOGIC: No rash, no itching, no lesions. ENDOCRINE: No polyuria, polydipsia, no heat or cold intolerance. No recent change in weight. HEMATOLOGICAL: No anemia or easy bruising or bleeding. NEUROLOGIC: No headache, seizures, numbness,  tingling or weakness. PSYCHIATRIC: No depression, no loss of interest in normal activity or change in sleep pattern.     Exam:   BP 128/84   Ht 5\' 7"  (1.702 m)   Wt 184 lb (83.5 kg)   BMI 28.82 kg/m   Body mass index is 28.82 kg/m.  General appearance : Well developed well nourished female. No acute distress HEENT: Eyes: no retinal hemorrhage or exudates,  Neck supple, trachea midline, no carotid bruits, no thyroidmegaly Lungs: Clear to auscultation, no rhonchi or wheezes, or rib retractions  Heart: Regular rate and rhythm, no murmurs or gallops Breast:Examined in sitting and supine position were symmetrical in appearance, no palpable masses or tenderness,  no skin retraction, no nipple inversion, no nipple discharge, no skin discoloration, no axillary or supraclavicular lymphadenopathy Abdomen: no palpable masses or tenderness, no rebound or guarding Extremities: no edema or skin discoloration or tenderness  Pelvic: Vulva: Normal             Vagina: No gross lesions or discharge  Cervix/Uterus absent  Adnexa  Without masses or tenderness   Anus: Normal   Assessment/Plan:  67 y.o. female for annual exam   1. Well female exam with routine gynecological exam Gynecologic exam status post TAH with left pelvic tenderness.  Pap test negative in August 2018, no indication to repeat this year.  Breast exam normal.  Screening mammogram was negative October 2019.  Health labs with family physician.  Colonoscopy 2014.  2. S/P TAH (total abdominal hysterectomy)  3. Postmenopausal Well on no hormone replacement therapy.  Bone density normal in April 2017.  Recommend repeating at 5 years.  Vitamin D supplements, calcium intake of 1.5 g/day and regular weightbearing physical activity recommended.  4. RLQ abdominal pain Right abdominal pelvic pain on and off for the last 3 weeks.  Tenderness on exam at the level of the right adnexa, unsure about any cyst or mass.  Follow-up as soon as  possible with a pelvic ultrasound to rule out any ovarian/gynecologic pathology. - US Transvaginal Non-OB; Future  5. Frequent urination U/A negative, patient reassured - Urinalysis,Complete w/RFL Culture - REFLEXIVE URINE CULTURE  Counseling on above issues and coordination of care more than 50% for 10 minutes.  Genia Del MD, 4:20 PM 11/02/2017

## 2017-11-03 ENCOUNTER — Encounter: Payer: Self-pay | Admitting: Obstetrics & Gynecology

## 2017-11-03 LAB — URINALYSIS, COMPLETE W/RFL CULTURE
BILIRUBIN URINE: NEGATIVE
Bacteria, UA: NONE SEEN /HPF
Glucose, UA: NEGATIVE
Hgb urine dipstick: NEGATIVE
Hyaline Cast: NONE SEEN /LPF
KETONES UR: NEGATIVE
LEUKOCYTE ESTERASE: NEGATIVE
NITRITES URINE, INITIAL: NEGATIVE
PROTEIN: NEGATIVE
RBC / HPF: NONE SEEN /HPF (ref 0–2)
SPECIFIC GRAVITY, URINE: 1.016 (ref 1.001–1.03)
SQUAMOUS EPITHELIAL / LPF: NONE SEEN /HPF (ref <=5)
WBC UA: NONE SEEN /HPF (ref 0–5)
pH: 6 (ref 5.0–8.0)

## 2017-11-03 LAB — NO CULTURE INDICATED

## 2017-11-03 NOTE — Patient Instructions (Addendum)
1. Well female exam with routine gynecological exam Gynecologic exam status post TAH with left pelvic tenderness.  Pap test negative in August 2018, no indication to repeat this year.  Breast exam normal.  Screening mammogram was negative October 2019.  Health labs with family physician.  Colonoscopy 2014.  2. S/P TAH (total abdominal hysterectomy)  3. Postmenopausal Well on no hormone replacement therapy.  Bone density normal in April 2017.  Recommend repeating at 5 years.  Vitamin D supplements, calcium intake of 1.5 g/day and regular weightbearing physical activity recommended.  4. RLQ abdominal pain Right abdominal pelvic pain on and off for the last 3 weeks.  Tenderness on exam at the level of the right adnexa, unsure about any cyst or mass.  Follow-up as soon as possible with a pelvic ultrasound to rule out any ovarian/gynecologic pathology. - US Transvaginal Non-OB; Future  5. Frequent urination U/A negative, patient reassured - Urinalysis,Complete w/RFL Culture - REFLEXIVE URINE CULTURE  Vonita, fue un placer verle hoy!

## 2017-11-10 ENCOUNTER — Ambulatory Visit: Payer: BC Managed Care – PPO | Admitting: Neurology

## 2017-11-16 ENCOUNTER — Telehealth: Payer: Self-pay | Admitting: *Deleted

## 2017-11-16 NOTE — Telephone Encounter (Signed)
Received Mammogram results from [facility];, forwarded to provider/SLS 10/23

## 2017-11-17 ENCOUNTER — Encounter: Payer: Self-pay | Admitting: Internal Medicine

## 2017-12-09 ENCOUNTER — Ambulatory Visit (INDEPENDENT_AMBULATORY_CARE_PROVIDER_SITE_OTHER): Payer: BC Managed Care – PPO

## 2017-12-09 DIAGNOSIS — Z23 Encounter for immunization: Secondary | ICD-10-CM

## 2017-12-09 NOTE — Progress Notes (Signed)
Pre visit review using our clinic tool,if applicable. No additional management support is needed unless otherwise documented below in the visit note.  

## 2017-12-20 ENCOUNTER — Ambulatory Visit (INDEPENDENT_AMBULATORY_CARE_PROVIDER_SITE_OTHER): Payer: BC Managed Care – PPO

## 2017-12-20 ENCOUNTER — Ambulatory Visit: Payer: BC Managed Care – PPO | Admitting: Obstetrics & Gynecology

## 2017-12-20 ENCOUNTER — Encounter: Payer: Self-pay | Admitting: Obstetrics & Gynecology

## 2017-12-20 VITALS — BP 124/80

## 2017-12-20 DIAGNOSIS — G5701 Lesion of sciatic nerve, right lower limb: Secondary | ICD-10-CM

## 2017-12-20 DIAGNOSIS — R1031 Right lower quadrant pain: Secondary | ICD-10-CM

## 2017-12-20 DIAGNOSIS — G8929 Other chronic pain: Secondary | ICD-10-CM | POA: Diagnosis not present

## 2017-12-20 NOTE — Progress Notes (Signed)
    Abigail [name] 19-Jul-1950 [id number]        67 y.o.  G1P1L1  RP: Chronic RLQ pain for Pelvic US  HPI: Had milk product this morning and is intolerant to lactose.  Felt bloated all day.  Right pelvic pain is worse with ambulation and motion.  Patient has a known bulging disc in the lumbar area.   OB History  Gravida Para Term Preterm AB Living  1 1       1   SAB TAB Ectopic Multiple Live Births          1    # Outcome Date GA Lbr Len/2nd Weight Sex Delivery Anes PTL Lv  1 Para     F Vag-Spont       Past medical history,surgical history, problem list, medications, allergies, family history and social history were all reviewed and documented in the EPIC chart.   Directed ROS with pertinent positives and negatives documented in the history of present illness/assessment and plan.  Exam:  Vitals:   12/20/17 1616  BP: 124/80   General appearance:  Normal  Pelvic US today: T/V and T/a images.  Status post hysterectomy.  Vaginal cuff normal.  Right and left ovaries not seen in context of excess bowel activity bilaterally in the adnexae.  No adnexal cyst or mass seen.  No free fluid in the posterior cul-de-sac.   Assessment/Plan:  67 y.o. G1P1   1. Chronic RLQ pain Pelvic ultrasound today limited by excess bowel activity bilaterally in the adnexae.  No adnexal cyst or mass seen.  Offered a repeat ultrasound after a mini prep and avoidance of lactose to which she is intolerant.  Patient declined a repeat pelvic ultrasound at this time.  2. Sciatic nerve disease, right Decision to investigate and manage sciatic nerve pain and reevaluate the right hip given that the pain is worse with ambulation and motion.  Counseling on above issues and coordination of care more than 50% for 15 minutes.  Abigail DelMarie-Lyne Tresten Pantoja MD, 4:46 PM 12/20/2017

## 2017-12-20 NOTE — Patient Instructions (Signed)
1. Chronic RLQ pain Pelvic ultrasound today limited by excess bowel activity bilaterally in the adnexae.  No adnexal cyst or mass seen.  Offered a repeat ultrasound after a mini prep and avoidance of lactose to which she is intolerant.  Patient declined a repeat pelvic ultrasound at this time.  2. Sciatic nerve disease, right Decision to investigate and manage sciatic nerve pain and reevaluate the right hip given that the pain is worse with ambulation and motion.  Abigail [name], it was a pleasure seeing you today!

## 2017-12-27 ENCOUNTER — Ambulatory Visit: Payer: BC Managed Care – PPO | Admitting: Neurology

## 2018-01-16 ENCOUNTER — Encounter (INDEPENDENT_AMBULATORY_CARE_PROVIDER_SITE_OTHER): Payer: BC Managed Care – PPO | Admitting: Ophthalmology

## 2018-01-23 ENCOUNTER — Encounter: Payer: Self-pay | Admitting: Internal Medicine

## 2018-01-23 ENCOUNTER — Ambulatory Visit: Payer: BC Managed Care – PPO | Admitting: Internal Medicine

## 2018-01-23 VITALS — BP 126/74 | HR 83 | Temp 98.4°F | Resp 16 | Ht 67.0 in | Wt 184.5 lb

## 2018-01-23 DIAGNOSIS — E119 Type 2 diabetes mellitus without complications: Secondary | ICD-10-CM | POA: Diagnosis not present

## 2018-01-23 DIAGNOSIS — R197 Diarrhea, unspecified: Secondary | ICD-10-CM | POA: Diagnosis not present

## 2018-01-23 DIAGNOSIS — L309 Dermatitis, unspecified: Secondary | ICD-10-CM

## 2018-01-23 MED ORDER — KETOCONAZOLE 2 % EX CREA: 1.0000 "application " | TOPICAL_CREAM | Freq: Every day | CUTANEOUS | 0 refills | Status: DC

## 2018-01-23 NOTE — Assessment & Plan Note (Signed)
Fungal dermatitis?  See physical exam, recommend Nizoral twice daily for 10 days.  Call if not better. 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  Pinprick examination normal Diarrhea: Started few hours ago, exam is benign, is hard to say where this will go, patient is afraid is influenza but she does not have any respiratory symptoms or fever.  For now recommend good hydration, Pepto-Bismol as needed and call or come back if symptoms persist or does not improve.

## 2018-01-23 NOTE — Patient Instructions (Signed)
Apply the cream twice a day for 10 days between the toes of the left foot.  Call if you are not better
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
## 2018-01-23 NOTE — Progress Notes (Signed)
Pre visit review using our clinic review tool, if applicable. No additional management support is needed unless otherwise documented below in the visit note. 

## 2018-01-31 ENCOUNTER — Ambulatory Visit: Payer: BC Managed Care – PPO | Admitting: Internal Medicine

## 2018-01-31 ENCOUNTER — Encounter: Payer: Self-pay | Admitting: Internal Medicine

## 2018-01-31 VITALS — BP 126/78 | HR 78 | Temp 98.1°F | Resp 16 | Ht 67.0 in | Wt 189.2 lb

## 2018-01-31 DIAGNOSIS — M79674 Pain in right toe(s): Secondary | ICD-10-CM | POA: Diagnosis not present

## 2018-01-31 DIAGNOSIS — J101 Influenza due to other identified influenza virus with other respiratory manifestations: Secondary | ICD-10-CM

## 2018-01-31 LAB — POCT INFLUENZA A/B
Influenza A, POC: POSITIVE — AB
Influenza B, POC: NEGATIVE

## 2018-01-31 LAB — POCT RAPID STREP A (OFFICE): RAPID STREP A SCREEN: NEGATIVE

## 2018-01-31 MED ORDER — OSELTAMIVIR PHOSPHATE 75 MG PO CAPS: 75.0000 mg | ORAL_CAPSULE | Freq: Two times a day (BID) | ORAL | 0 refills | Status: DC

## 2018-01-31 NOTE — Progress Notes (Signed)
Pre visit review using our clinic review tool, if applicable. No additional management support is needed unless otherwise documented below in the visit note. 

## 2018-01-31 NOTE — Patient Instructions (Signed)
Rest, fluids , tylenol  For cough:  Take Mucinex DM twice a day as needed until better  For nasal congestion: Use OTC  Flonase : 2 nasal sprays on each side of the nose in the morning until you feel better  Take TAMIFLU   Call if not gradually better over the next 5 days  Do not go to work until you are free of fever for at least 36 hours (without taking Tylenol)   Call anytime if the symptoms are severe or if you initially get better and then the fevers resurface.   Influenza, Adult Influenza, more commonly known as "the flu," is a viral infection that mainly affects the respiratory tract. The respiratory tract includes organs that help you breathe, such as the lungs, nose, and throat. The flu causes many symptoms similar to the common cold along with high fever and body aches. The flu spreads easily from person to person (is contagious). Getting a flu shot (influenza vaccination) every year is the best way to prevent the flu. What are the causes? This condition is caused by the influenza virus. You can get the virus by:  Breathing in droplets that are in the air from an infected person's cough or sneeze.  Touching something that has been exposed to the virus (has been contaminated) and then touching your mouth, nose, or eyes. What increases the risk? The following factors may make you more likely to get the flu:  Not washing or sanitizing your hands often.  Having close contact with many people during cold and flu season.  Touching your mouth, eyes, or nose without first washing or sanitizing your hands.  Not getting a yearly (annual) flu shot. You may have a higher risk for the flu, including serious problems such as a lung infection (pneumonia), if you:  Are older than 65.  Are pregnant.  Have a weakened disease-fighting system (immune system). You may have a weakened immune system if you: ? Have HIV or AIDS. ? Are undergoing chemotherapy. ? Are taking medicines that  reduce (suppress) the activity of your immune system.  Have a long-term (chronic) illness, such as heart disease, kidney disease, diabetes, or lung disease.  Have a liver disorder.  Are severely overweight (morbidly obese).  Have anemia. This is a condition that affects your red blood cells.  Have asthma. What are the signs or symptoms? Symptoms of this condition usually begin suddenly and last 4-14 days. They may include:  Fever and chills.  Headaches, body aches, or muscle aches.  Sore throat.  Cough.  Runny or stuffy (congested) nose.  Chest discomfort.  Poor appetite.  Weakness or fatigue.  Dizziness.  Nausea or vomiting. How is this diagnosed? This condition may be diagnosed based on:  Your symptoms and medical history.  A physical exam.  Swabbing your nose or throat and testing the fluid for the influenza virus. How is this treated? If the flu is diagnosed early, you can be treated with medicine that can help reduce how severe the illness is and how long it lasts (antiviral medicine). This may be given by mouth (orally) or through an IV. Taking care of yourself at home can help relieve symptoms. Your health care provider may recommend:  Taking over-the-counter medicines.  Drinking plenty of fluids. In many cases, the flu goes away on its own. If you have severe symptoms or complications, you may be treated in a hospital. Follow these instructions at home: Activity  Rest as needed and get plenty  of sleep.  Stay home from work or school as told by your health care provider. Unless you are visiting your health care provider, avoid leaving home until your fever has been gone for 24 hours without taking medicine. Eating and drinking  Take an oral rehydration solution (ORS). This is a drink that is sold at pharmacies and retail stores.  Drink enough fluid to keep your urine pale yellow.  Drink clear fluids in small amounts as you are able. Clear fluids  include water, ice chips, diluted fruit juice, and low-calorie sports drinks.  Eat bland, easy-to-digest foods in small amounts as you are able. These foods include bananas, applesauce, rice, lean meats, toast, and crackers.  Avoid drinking fluids that contain a lot of sugar or caffeine, such as energy drinks, regular sports drinks, and soda.  Avoid alcohol.  Avoid spicy or fatty foods. General instructions      Take over-the-counter and prescription medicines only as told by your health care provider.  Use a cool mist humidifier to add humidity to the air in your home. This can make it easier to breathe.  Cover your mouth and nose when you cough or sneeze.  Wash your hands with soap and water often, especially after you cough or sneeze. If soap and water are not available, use alcohol-based hand sanitizer.  Keep all follow-up visits as told by your health care provider. This is important. How is this prevented?   Get an annual flu shot. You may get the flu shot in late summer, fall, or winter. Ask your health care provider when you should get your flu shot.  Avoid contact with people who are sick during cold and flu season. This is generally fall and winter. Contact a health care provider if:  You develop new symptoms.  You have: ? Chest pain. ? Diarrhea. ? A fever.  Your cough gets worse.  You produce more mucus.  You feel nauseous or you vomit. Get help right away if:  You develop shortness of breath or difficulty breathing.  Your skin or nails turn a bluish color.  You have severe pain or stiffness in your neck.  You develop a sudden headache or sudden pain in your face or ear.  You cannot eat or drink without vomiting. Summary  Influenza, more commonly known as "the flu," is a viral infection that primarily affects your respiratory tract.  Symptoms of the flu usually begin suddenly and last 4-14 days.  Getting an annual flu shot is the best way to  prevent getting the flu.  Stay home from work or school as told by your health care provider. Unless you are visiting your health care provider, avoid leaving home until your fever has been gone for 24 hours without taking medicine.  Keep all follow-up visits as told by your health care provider. This is important. This information is not intended to replace advice given to you by your health care provider. Make sure you discuss any questions you have with your health care provider. Document Released: 01/09/2000 Document Revised: 06/29/2017 Document Reviewed: 06/29/2017 Elsevier Interactive Patient Education  2019 [employer].
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
## 2018-02-01 NOTE — Assessment & Plan Note (Signed)
Influenza A: Patient presents with viral syndrome, strep test negative, influenza A came back positive. Vital signs are stable, she does not look toxic, although is a slightly over 48 hours I will go ahead and prescribe Tamiflu. We also talked about supportive treatment with Mucinex, fluids, Tylenol.  Needs to avoid going to work for few days.  Aware influenza has a potential to be more serious than URI, if she is not better she will let me know.  See AVS. Pain, 4th right toe: At the last visit, I noticed some maceration between the toes and felt that that was probably fungal dermatitis, on today exam there is no maceration but she reports extreme pain to touch on the right fourth toe.  Etiology unclear, referred to a podiatrist.

## 2018-02-02 ENCOUNTER — Telehealth: Payer: Self-pay

## 2018-02-02 NOTE — Telephone Encounter (Signed)
Please advise 

## 2018-02-02 NOTE — Telephone Encounter (Signed)
Copied from CRM [id number]. Topic: General - Other >> Feb 02, 2018  8:02 AM Leafy Ro wrote: Reason for CRM: pt saw dr Drue Novel on 01-31-2018 dx with the flu. Pt is still having diarrhea and would like to go back to work on 02-07-2018. Please put work note r in pt mychart . Pt will print and give to her employer

## 2018-02-02 NOTE — Telephone Encounter (Signed)
That is ok 

## 2018-02-02 NOTE — Telephone Encounter (Signed)
Letter released to MyChart.  

## 2018-02-03 ENCOUNTER — Ambulatory Visit (INDEPENDENT_AMBULATORY_CARE_PROVIDER_SITE_OTHER): Payer: BC Managed Care – PPO

## 2018-02-03 ENCOUNTER — Ambulatory Visit: Payer: BC Managed Care – PPO

## 2018-02-03 ENCOUNTER — Encounter: Payer: Self-pay | Admitting: Podiatry

## 2018-02-03 ENCOUNTER — Ambulatory Visit: Payer: BC Managed Care – PPO | Admitting: Podiatry

## 2018-02-03 DIAGNOSIS — M7750 Other enthesopathy of unspecified foot: Secondary | ICD-10-CM

## 2018-02-03 DIAGNOSIS — M779 Enthesopathy, unspecified: Secondary | ICD-10-CM | POA: Diagnosis not present

## 2018-02-03 DIAGNOSIS — M2041 Other hammer toe(s) (acquired), right foot: Secondary | ICD-10-CM | POA: Diagnosis not present

## 2018-02-03 DIAGNOSIS — M7752 Other enthesopathy of left foot: Secondary | ICD-10-CM

## 2018-02-03 DIAGNOSIS — M778 Other enthesopathies, not elsewhere classified: Secondary | ICD-10-CM

## 2018-02-03 MED ORDER — TRIAMCINOLONE ACETONIDE 10 MG/ML IJ SUSP
10.0000 mg | Freq: Once | INTRAMUSCULAR | Status: AC
Start: 2018-02-03 — End: 2018-02-03
  Administered 2018-02-03: 10 mg

## 2018-02-03 NOTE — Progress Notes (Signed)
   Subjective:    Patient ID: Abigail [name], female    DOB: 17-Jul-1950, 68 y.o.   MRN: [id number]  HPI    Review of Systems  All other systems reviewed and are negative.      Objective:   Physical Exam        Assessment & Plan:

## 2018-02-13 ENCOUNTER — Encounter (INDEPENDENT_AMBULATORY_CARE_PROVIDER_SITE_OTHER): Payer: BC Managed Care – PPO | Admitting: Ophthalmology

## 2018-02-13 DIAGNOSIS — I10 Essential (primary) hypertension: Secondary | ICD-10-CM | POA: Diagnosis not present

## 2018-02-13 DIAGNOSIS — H353112 Nonexudative age-related macular degeneration, right eye, intermediate dry stage: Secondary | ICD-10-CM

## 2018-02-13 DIAGNOSIS — E11319 Type 2 diabetes mellitus with unspecified diabetic retinopathy without macular edema: Secondary | ICD-10-CM | POA: Diagnosis not present

## 2018-02-13 DIAGNOSIS — H43813 Vitreous degeneration, bilateral: Secondary | ICD-10-CM

## 2018-02-13 DIAGNOSIS — E113293 Type 2 diabetes mellitus with mild nonproliferative diabetic retinopathy without macular edema, bilateral: Secondary | ICD-10-CM | POA: Diagnosis not present

## 2018-02-13 DIAGNOSIS — H35033 Hypertensive retinopathy, bilateral: Secondary | ICD-10-CM

## 2018-02-16 ENCOUNTER — Ambulatory Visit (INDEPENDENT_AMBULATORY_CARE_PROVIDER_SITE_OTHER): Payer: BC Managed Care – PPO | Admitting: Internal Medicine

## 2018-02-16 ENCOUNTER — Encounter: Payer: Self-pay | Admitting: Internal Medicine

## 2018-02-16 VITALS — BP 128/80 | HR 75 | Ht 67.0 in | Wt 184.0 lb

## 2018-02-16 DIAGNOSIS — E559 Vitamin D deficiency, unspecified: Secondary | ICD-10-CM | POA: Diagnosis not present

## 2018-02-16 DIAGNOSIS — E119 Type 2 diabetes mellitus without complications: Secondary | ICD-10-CM | POA: Diagnosis not present

## 2018-02-16 DIAGNOSIS — E210 Primary hyperparathyroidism: Secondary | ICD-10-CM | POA: Diagnosis not present

## 2018-02-16 LAB — POCT GLYCOSYLATED HEMOGLOBIN (HGB A1C): HEMOGLOBIN A1C: 6.9 % — AB (ref 4.0–5.6)

## 2018-02-16 LAB — VITAMIN D 25 HYDROXY (VIT D DEFICIENCY, FRACTURES): VITD: 35.44 ng/mL (ref 30.00–100.00)

## 2018-02-16 MED ORDER — METFORMIN HCL ER 500 MG PO TB24: 500.0000 mg | ORAL_TABLET | Freq: Three times a day (TID) | ORAL | 3 refills | Status: DC

## 2018-02-16 NOTE — Progress Notes (Signed)
Patient ID: Abigail [name], female   DOB: 11/03/1950, 68 y.o.   MRN: [id number]  HPI: Abigail [name] is a 68 y.o.-year-old female, returning for f/u for DM2, dx in ~2010, non-insulin-dependent, controlled, without complications and h/o primary HPTH.  She is the wife of [name], also my patient. Last visit 6 months ago.  She ate more cake during the Holidays.  Sugars are little higher.  She did not start exercise yet as she feels too tired at the end of the day.  Last hemoglobin A1c was: Lab Results  Component Value Date   HGBA1C 6.6 (A) 08/12/2017   HGBA1C 6.5 11/23/2016   HGBA1C 6.7 05/12/2016   Pt is on a regimen of: - Metformin ER 500 mg 2x >> 3x a day  Pt checks her sugars once a day in the morning: - am:   93-131, 134 >> 103-151, 155 (fruit at night) >> 117-146, 154 - 2h after b'fast: 96, 149, 159 >> n/c >> 94-119, 145, 195 - before lunch: n/c >> 105, 126 >> n/c >> 94 - 2h after lunch:117, 175 >> 140s >> n/c >> 162 - before dinner:n/c >> 100 >> 114, 184 >> n/c >> 94 - 2h after dinner:  144 >> n/c >> 189 (ate late) >> n/c >> 97, 141 - bedtime: n/c >> 132 >> n/c - nighttime: n/c Lowest sugar was 87 >> 93 >> 103 >> 81; she has hypoglycemia awareness in the 80s. Highest sugar was  189 >> 155 >> 195.  Glucometer: ReliOn  Pt's meals are: - Breakfast: Fruit, eggs - Lunch: chicken/fish + veggies - no red meat  - Dinner: veggies + fruit +/- chicken/fish - Snacks: cashews, fruit ([place])  -No CKD, last BUN/creatinine:  Lab Results  Component Value Date   BUN 10 07/20/2017   CREATININE 0.88 07/20/2017   -No HL; last set of lipids: Lab Results  Component Value Date   CHOL 178 08/12/2017   HDL 65.30 08/12/2017   LDLCALC 98 08/12/2017   TRIG 73.0 08/12/2017   CHOLHDL 3 08/12/2017  She is not on a statin. - last eye exam was in 11/2016: No DR - + numbness and tingling in her feet.  She had a small surgery on her toe.  H/o primary hyperparathyroidism   - s/p 2x parathyroid glands resected in 2015, with resolution of her hypercalcemia She occasionally takes Tums x1 but only when she feels weak, not on a daily basis.  Calcium levels remain normal: Lab Results  Component Value Date   CALCIUM 9.2 07/20/2017   CALCIUM 9.1 07/21/2016   CALCIUM 8.8 02/20/2016   CALCIUM 9.1 12/22/2015   CALCIUM 9.2 07/09/2015   CALCIUM 9.2 01/02/2015   CALCIUM 10.4 05/03/2006   At last visit, we checked a vitamin D level and this was low: Lab Results  Component Value Date   VD25OH 14.17 (L) 08/12/2017   We started 5000 vitamin D daily.  I advised her to come back for labs in 2 months, but she did not do this.  ROS: Constitutional: + weight gain/no weight loss, no fatigue, no subjective hyperthermia, + subjective hypothermia, + nocturia Eyes: no blurry vision, no xerophthalmia ENT: + sore throat, no nodules palpated in neck, no dysphagia, no odynophagia, no hoarseness Cardiovascular: no CP/no SOB/no palpitations/no leg swelling Respiratory: + cough/no SOB/no wheezing Gastrointestinal: no N/no V/+ D/no C/+ acid reflux Musculoskeletal: no muscle aches/+ joint aches Skin: + rash, + hair loss Neurological: no tremors/no numbness/no tingling/no dizziness  I  reviewed pt's medications, allergies, PMH, social hx, family hx, and changes were documented in the history of present illness. Otherwise, unchanged from my initial visit note.   Past Medical History:  Diagnosis Date  . Asthma   . Chiari malformation type I (HCC)   . Diabetes mellitus   . GERD (gastroesophageal reflux disease)   . Hx of gout   . Hyperlipidemia   . Hyperparathyroidism (HCC)   . Hypertension   . Lumbar radiculopathy   . Postablative hypothyroidism   . Sickle cell anemia (HCC)   . Spondylolysis   . Thyroid disease    Past Surgical History:  Procedure Laterality Date  . ABDOMINAL HYSTERECTOMY  1996   NO oophorectomy  . BREAST BIOPSY Left 2015   [facility]   . CERVICAL  LAMINECTOMY     at C1 w/ duraplasty  . CRANIECTOMY SUBOCCIPITAL W/ CERVICAL LAMINECTOMY / CHIARI  07/05/2011   [facility]  . [facility]OMY  2015   [facility]    Social History   Social History  . Marital Status: Married    Spouse Name: N/A  . Number of Children: 1   Occupational History  . teacher - high school     Social History Main Topics  . Smoking status: Never Smoker   . Smokeless tobacco: Not on file  . Alcohol Use: No  . Drug Use: No   Social History Narrative   Original from [place]   Married x 44 years   Current Outpatient Medications on File Prior to Visit  Medication Sig Dispense Refill  . acetaminophen (TYLENOL) 500 MG tablet Take 1,000 mg by mouth every 6 (six) hours as needed.    Marland Kitchen. aspirin 81 MG tablet Take 81 mg by mouth daily.      . COLLAGEN PO Take by mouth. Take as directed    . glucose blood (ONE TOUCH ULTRA TEST) test strip Reported on 07/09/2015    . ketoconazole (NIZORAL) 2 % cream Apply 1 application topically daily. 30 g 0  . metFORMIN (GLUCOPHAGE-XR) 500 MG 24 hr tablet Take 1 tablet (500 mg total) by mouth 3 (three) times daily with meals. 270 tablet 3  . oseltamivir (TAMIFLU) 75 MG capsule Take 1 capsule (75 mg total) by mouth 2 (two) times daily. 10 capsule 0  . OVER THE COUNTER MEDICATION Health joint    . Potassium 99 MG TABS Take by mouth.     No current facility-administered medications on file prior to visit.    Allergies  Allergen Reactions  . Pollen Extract Itching  . Citrus Itching, Rash and Swelling  . Ibuprofen Swelling and Rash    Pt now states that she can not take ibuprofen  . Other Itching, Rash and Swelling    seafood. seafood   Family History  Problem Relation Age of Onset  . Hypertension Sister        sister, daughter , mother   . Hyperlipidemia Sister   . Thyroid disease Sister   . Diabetes Daughter   . Stroke Mother   . Colon cancer Neg Hx   . Sudden death Neg Hx   . Heart attack Neg Hx   .  Breast cancer Neg Hx   . Stomach cancer Neg Hx    PE: BP 128/80   Pulse 75   Ht 5\' 7"  (1.702 m) Comment: measured  Wt 184 lb (83.5 kg)   SpO2 98%   BMI 28.82 kg/m  Wt Readings from Last 3 Encounters:  02/16/18 184 lb (83.5 kg)  01/31/18 189 lb 4 oz (85.8 kg)  01/23/18 184 lb 8 oz (83.7 kg)   Constitutional: overweight, in NAD Eyes: PERRLA, EOMI, no exophthalmos ENT: moist mucous membranes, no thyromegaly, no cervical lymphadenopathy Cardiovascular: RRR, No MRG Respiratory: CTA B Gastrointestinal: abdomen soft, NT, ND, BS+ Musculoskeletal: no deformities, strength intact in all 4 Skin: moist, warm, no rashes Neurological: no tremor with outstretched hands, DTR normal in all 4  ASSESSMENT: 1. DM2, non-insulin-dependent, now more controlled, without complications  2. History of hyperparathyroidism -  status post 2 gland parathyroidectomy in 2015   3. Vit D def  PLAN:  1. Patient with improved diabetes control, on oral antidiabetic regimen with metformin ER only.  She tolerates the medication well, without nausea or diarrhea.  Sugars were mostly at goal at last visit, with occasional hyperglycemia after dietary indiscretions (but mostly when she ate food at night).  At last visit we discussed about starting some form of exercise to reduce her insulin resistance.  She was planning to start walking. -At this visit, sugars are slightly higher due to the holidays, however, they are still close to goal, so no changes are needed for now.  Her highest sugars are still in the morning and they get better during the day with few exceptions when she eats fruit.  We again discussed about the need to start exercise, but we will not make any other changes in her regimen.  I refilled her metformin. - I suggested to:   Patient Instructions  Please continue metformin ER 500 mg 3x a day.  Also, continue vitamin D 5000 units daily.  Please stop at the lab.  Please return in 6 months with your  sugar log.   - today, HbA1c is 6.9% (slightly higher) - continue checking sugars at different times of the day - check 1x a day, rotating checks - advised for yearly eye exams >> she is UTD - Return to clinic in 6 mo with sugar log   2. History of primary hyperparathyroidism -She is status post parathyroidectomy -Most recent calcium levels were normal -She takes calcium (Tums) when she feels weak or tired -She did have a low vitamin D level at last visit (see below)  3. Vit D def -Her vitamin D level was low at last visit so I suggested to start 5000 units vitamin D daily -We will recheck the level today  Office Visit on 02/16/2018  Component Date Value Ref Range Status  . VITD 02/16/2018 35.44  30.00 - 100.00 ng/mL Final  . Hemoglobin A1C 02/16/2018 6.9* 4.0 - 5.6 % Final   Normal vit D -we will continue the current supplement dose.  Carlus Pavlov, MD PhD [facility]

## 2018-02-16 NOTE — Patient Instructions (Signed)
Please continue metformin ER 500 mg 3x a day.  Also, continue vitamin D 5000 units daily.  Please stop at the lab.  Please return in 6 months with your sugar log.

## 2018-02-23 ENCOUNTER — Ambulatory Visit: Payer: BC Managed Care – PPO | Admitting: Podiatry

## 2018-02-23 NOTE — Progress Notes (Signed)
Subjective:   Patient ID: Abigail LaymanIleana Tejada-de [name], female   DOB: 68 y.o.   MRN: [id number]   HPI Patient presents stating that the fourth toe right has really been bothering her and that she has had some pain in the ankles and has digital hammertoe deformity.  Patient states this is been ongoing and gradually getting worse and making it harder for her to be ambulatory.  Patient does not smoke likes to be active   Review of Systems  All other systems reviewed and are negative.       Objective:  Physical Exam Vitals signs and nursing note reviewed.  Constitutional:      Appearance: She is well-developed.  Pulmonary:     Effort: Pulmonary effort is normal.  Musculoskeletal: Normal range of motion.  Skin:    General: Skin is warm.  Neurological:     Mental Status: She is alert.     Neurovascular status intact muscle strength is adequate range of motion within normal limits with patient's fourth digit right found to have fluid at the inner phalangeal joint with keratotic tissue formation with patient noted to have digital deformity and inflammation pain of the ankle region bilateral.  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  I went ahead today and I did a proximal nerve block allow the area to get numb and did sterile prep of the area and injected with 2 mg Dexasone Kenalog and applied padding and sterile dressing and we will see this patient back and may ultimately require arthroplasty or other procedure.  Consider treatment of the ankle depending on response  X-rays indicate pressure between the fourth and fifth digit right with no indications of other pathology

## 2018-03-03 ENCOUNTER — Ambulatory Visit: Payer: BC Managed Care – PPO | Admitting: Family Medicine

## 2018-03-03 ENCOUNTER — Encounter: Payer: Self-pay | Admitting: Family Medicine

## 2018-03-03 ENCOUNTER — Encounter: Payer: Self-pay | Admitting: Internal Medicine

## 2018-03-03 VITALS — BP 120/82 | HR 85 | Temp 98.2°F | Ht 67.0 in | Wt 184.5 lb

## 2018-03-03 DIAGNOSIS — R6889 Other general symptoms and signs: Secondary | ICD-10-CM

## 2018-03-03 MED ORDER — OSELTAMIVIR PHOSPHATE 75 MG PO CAPS: 75.0000 mg | ORAL_CAPSULE | Freq: Two times a day (BID) | ORAL | 0 refills | Status: DC

## 2018-03-03 NOTE — Patient Instructions (Addendum)
Continue to push fluids, practice good hand hygiene, and cover your mouth if you cough.  If you start having increasing fevers, shaking or shortness of breath, seek immediate care.  OK to take Tylenol 1000 mg (2 extra strength tabs) or 975 mg (3 regular strength tabs) every 6 hours as needed.  Ibuprofen 400-600 mg (2-3 over the counter strength tabs) every 6 hours as needed for pain.  For symptoms, consider using Vick's VapoRub on chest or under nose, air humidifier, Benadryl at night, and elevating the head of the bed. Tylenol and ibuprofen for aches and pains you may be experiencing.   Let us know if you need anything.   

## 2018-03-03 NOTE — Progress Notes (Signed)
Chief Complaint  Patient presents with  . Cough    congestion  . Diarrhea  . Sore Throat    Mardella LaymanIleana Tejada-de Morgan here for URI complaints.  Duration: 1 day  Associated symptoms: subjective fever/chills (has not checked temp at home), sinus congestion, sinus pain, rhinorrhea, ear pain, sore throat, myalgia and cough Denies: itchy watery eyes, ear drainage, wheezing and shortness of breath Treatment to date: Tylenol, cough medicine Sick contacts: Yes- sick students  ROS:  Const: +subj fevers HEENT: As noted in HPI Lungs: No SOB  Past Medical History:  Diagnosis Date  . Asthma   . Chiari malformation type I (HCC)   . Diabetes mellitus   . GERD (gastroesophageal reflux disease)   . Hx of gout   . Hyperlipidemia   . Hyperparathyroidism (HCC)   . Hypertension   . Lumbar radiculopathy   . Postablative hypothyroidism   . Sickle cell anemia (HCC)   . Spondylolysis   . Thyroid disease     BP 120/82 (BP Location: Left Arm, Patient Position: Sitting, Cuff Size: Normal)   Pulse 85   Temp 98.2 F (36.8 C) (Oral)   Ht 5\' 7"  (1.702 m)   Wt 184 lb 8 oz (83.7 kg)   SpO2 98%   BMI 28.90 kg/m  General: Awake, alert, appears stated age HEENT: AT, [place], ears patent b/l and TM's neg, nares patent w/o discharge, pharynx pink and without exudates, MMM Neck: No masses or asymmetry Heart: RRR Lungs: CTAB, no accessory muscle use Psych: Age appropriate judgment and insight, normal mood and affect  Flu-like symptoms - Plan: oseltamivir (TAMIFLU) 75 MG capsule  Orders as above. Given s/s's an duration, will tx.  Tylenol. NSAIDs.  Continue to push fluids, practice good hand hygiene, cover mouth when coughing. F/u prn. If starting to experience fevers, shaking, or shortness of breath, seek immediate care. Pt voiced understanding and agreement to the plan.  Jilda Rocheicholas Paul LibertyWendling, DO 03/03/18 11:55 AM

## 2018-03-23 ENCOUNTER — Telehealth: Payer: Self-pay | Admitting: Neurology

## 2018-03-23 NOTE — Telephone Encounter (Signed)
Pt has asked that a message be sent satting she is very concerned about her memory and an increase in her forgetting things.  Pt would like a call to discuss.  Pt has scheduled her f/u appointment and is on wait list.  Please call

## 2018-03-23 NOTE — Telephone Encounter (Signed)
Called the patient and there was no answer. I left a message informing her that I could work her in on Monday 03/27/2018 at 11:00 with a check in of 10:30 am. I advised the patient to call back if this apt works for her.   IF patient calls back the 11:00 am slot Monday if blocked but we can work patient in at that time. Please schedule her for that time. Otherwise I will place patient on wait list and try another time for her.

## 2018-04-05 ENCOUNTER — Ambulatory Visit: Payer: BC Managed Care – PPO | Admitting: Medical

## 2018-04-05 ENCOUNTER — Encounter: Payer: Self-pay | Admitting: Medical

## 2018-04-05 ENCOUNTER — Other Ambulatory Visit: Payer: Self-pay

## 2018-04-05 VITALS — BP 138/53 | HR 94 | Temp 98.1°F | Resp 12 | Ht 67.0 in | Wt 184.0 lb

## 2018-04-05 DIAGNOSIS — J029 Acute pharyngitis, unspecified: Secondary | ICD-10-CM

## 2018-04-05 DIAGNOSIS — J329 Chronic sinusitis, unspecified: Secondary | ICD-10-CM | POA: Diagnosis not present

## 2018-04-05 DIAGNOSIS — R195 Other fecal abnormalities: Secondary | ICD-10-CM

## 2018-04-05 DIAGNOSIS — M791 Myalgia, unspecified site: Secondary | ICD-10-CM

## 2018-04-05 DIAGNOSIS — R05 Cough: Secondary | ICD-10-CM

## 2018-04-05 DIAGNOSIS — H6690 Otitis media, unspecified, unspecified ear: Secondary | ICD-10-CM

## 2018-04-05 DIAGNOSIS — R059 Cough, unspecified: Secondary | ICD-10-CM

## 2018-04-05 MED ORDER — FLUTICASONE PROPIONATE 50 MCG/ACT NA SUSP: 2.0000 | Freq: Every day | NASAL | 1 refills | Status: DC

## 2018-04-05 MED ORDER — AZITHROMYCIN 250 MG PO TABS: ORAL_TABLET | ORAL | 0 refills | Status: DC

## 2018-04-05 MED ORDER — BENZONATATE 100 MG PO CAPS: 100.0000 mg | ORAL_CAPSULE | Freq: Three times a day (TID) | ORAL | 0 refills | Status: DC | PRN

## 2018-04-05 MED ORDER — ALBUTEROL SULFATE HFA 108 (90 BASE) MCG/ACT IN AERS: 2.0000 | INHALATION_SPRAY | Freq: Four times a day (QID) | RESPIRATORY_TRACT | 2 refills | Status: DC | PRN

## 2018-04-05 NOTE — Patient Instructions (Addendum)
On exam you appear to have a sinus infection and left ear infection.  Your throat does also look suspicious for possible strep but your rapid strep test came back negative.  With your recent muscle aches we decided to do a flu test.  Flu test came back negative.  Since body aches did start within the last 24hours consider giving you Tamiflu since that would be in the treatment timeframe window but you had Tamiflu before and reported side effects so did not prescribe.  You have some loose stools recently.  If that persist let us know and we will get gastro panel/culture studies.  Recommend bland diet, hydrate well and use Imodium as needed.  For sinus infection and ear infection will prescribe azithromycin antibiotic.  For nasal congestion, I prescribed Flonase.  For cough I prescribed benzonatate.  If you have persisting severe cough with chest congestion then recommend notifying us and we will get chest x-ray.  Presently chest is clear.  Follow-up in 7 to 10 days or as needed.  For body aches recommend low-dose Tylenol or ibuprofen.  For wheezing making albuterol available.  If you are having to use albuterol every 4-6 hours then please let us know.

## 2018-04-05 NOTE — Progress Notes (Signed)
Subjective:    Patient ID: Abigail [name], female    DOB: [birth year], 68 y.o.   MRN: [id number]  HPI  Pt in with 2 days of st, face pain/sinus pressure, diarrhea, and some cough. Pt has not had any fever. Today no tylenol and no fever as well. Pt is coughing a lot. She is wheezing a little.   Hx of asthma as child and shortly after birth of her daughter.  She has sinus pain that is moderate.Describes palate pain with some ear pain as well on left side. Some mild  diffuse body pain as well.  Pt had 6 loose stools yesterday. She took some pepto bismol yesterday and no loose today.  Pt has not had any travel recently.   Hx of spring allergies.  Pt has not had any travels recently.    Review of Systems  HENT: Positive for congestion, sinus pressure, sinus pain and sore throat.   Respiratory: Positive for cough. Negative for chest tightness, shortness of breath and wheezing.   Cardiovascular: Negative for chest pain and palpitations.  Gastrointestinal: Negative for abdominal distention, blood in stool, constipation, nausea and vomiting.       Loose stools.  Musculoskeletal: Positive for arthralgias and myalgias.       Some joint pain in hands for more than a year apart from acute symptoms.  Skin: Negative for rash.  Neurological: Negative for dizziness.  Psychiatric/Behavioral: Negative for agitation and behavioral problems. The patient is not nervous/anxious.     Past Medical History:  Diagnosis Date  . Asthma   . Chiari malformation type I (HCC)   . Diabetes mellitus   . GERD (gastroesophageal reflux disease)   . Hx of gout   . Hyperlipidemia   . Hyperparathyroidism (HCC)   . Hypertension   . Lumbar radiculopathy   . Postablative hypothyroidism   . Sickle cell anemia (HCC)   . Spondylolysis   . Thyroid disease      Social History   Socioeconomic History  . Marital status: Married    Spouse name: Not on file  . Number of children: 1  . Years of  education: Not on file  . Highest education level: Not on file  Occupational History  . Occupation: Runner, broadcasting/film/video , high school  Social Needs  . Financial resource strain: Not on file  . Food insecurity:    Worry: Not on file    Inability: Not on file  . Transportation needs:    Medical: Not on file    Non-medical: Not on file  Tobacco Use  . Smoking status: Never Smoker  . Smokeless tobacco: Never Used  Substance and Sexual Activity  . Alcohol use: No  . Drug use: No  . Sexual activity: Not Currently    Partners: Male    Comment: 1st intercourse- 21, married- 46 yrs   Lifestyle  . Physical activity:    Days per week: Not on file    Minutes per session: Not on file  . Stress: Not on file  Relationships  . Social connections:    Talks on phone: Not on file    Gets together: Not on file    Attends religious service: Not on file    Active member of club or organization: Not on file    Attends meetings of clubs or organizations: Not on file    Relationship status: Not on file  . Intimate partner violence:    Fear of current or ex partner: Not on  file    Emotionally abused: Not on file    Physically abused: Not on file    Forced sexual activity: Not on file  Other Topics Concern  . Not on file  Social History Narrative   Original from [place]   Married x 44 years   1 daughter     Past Surgical History:  Procedure Laterality Date  . ABDOMINAL HYSTERECTOMY  1996   NO oophorectomy  . BREAST BIOPSY Left 2015   [facility]   . CERVICAL LAMINECTOMY     at C1 w/ duraplasty  . CRANIECTOMY SUBOCCIPITAL W/ CERVICAL LAMINECTOMY / [facility]IARI  07/05/2011   At C1, [facility]  . [facility]OMY  2015   [facility]     Family History  Problem Relation Age of Onset  . Hypertension Sister        sister, daughter , mother   . Hyperlipidemia Sister   . Thyroid disease Sister   . Diabetes Daughter   . Stroke Mother   . Colon cancer Neg Hx   . Sudden death Neg Hx   . Heart attack Neg  Hx   . Breast cancer Neg Hx   . Stomach cancer Neg Hx     Allergies  Allergen Reactions  . Pollen Extract Itching  . Citrus Itching, Rash and Swelling  . Ibuprofen Swelling and Rash    Pt now states that she can not take ibuprofen  . Other Itching, Rash and Swelling    seafood. seafood    Current Outpatient Medications on File Prior to Visit  Medication Sig Dispense Refill  . acetaminophen (TYLENOL) 500 MG tablet Take 1,000 mg by mouth every 6 (six) hours as needed.    Marland Kitchen aspirin 81 MG tablet Take 81 mg by mouth daily.      . COLLAGEN PO Take by mouth. Take as directed    . glucose blood (ONE TOUCH ULTRA TEST) test strip Reported on 07/09/2015    . metFORMIN (GLUCOPHAGE-XR) 500 MG 24 hr tablet Take 1 tablet (500 mg total) by mouth 3 (three) times daily with meals. 270 tablet 3  . oseltamivir (TAMIFLU) 75 MG capsule Take 1 capsule (75 mg total) by mouth 2 (two) times daily. 10 capsule 0  . OVER THE COUNTER MEDICATION Health joint    . Potassium 99 MG TABS Take by mouth.     No current facility-administered medications on file prior to visit.     BP (!) 138/53   Pulse 94   Temp 98.1 F (36.7 C)   Resp 12   Ht 5\' 7"  (1.702 m)   Wt 184 lb (83.5 kg)   SpO2 98%   BMI 28.82 kg/m       Objective:   Physical Exam  General  Mental Status - Alert. General Appearance - Well groomed. Not in acute distress.  Skin Rashes- No Rashes.  HEENT Head- Normal. Ear Auditory Canal - Left- Normal. Right - Normal.Tympanic Membrane- Left- central redness  Right- Normal. Eye Sclera/Conjunctiva- Left- Normal. Right- Normal. Nose & Sinuses Nasal Mucosa- Left-  Boggy and Congested. Right-  Boggy and  Congested.Bilateral maxillary and frontal sinus pressure. Mouth & Throat Lips: Upper Lip- Normal: no dryness, cracking, pallor, cyanosis, or vesicular eruption. Lower Lip-Normal: no dryness, cracking, pallor, cyanosis or vesicular eruption. Buccal Mucosa- Bilateral- No Aphthous ulcers.  Oropharynx- No Discharge or Erythema. Tonsils: Characteristics- Bilateral- Mild  Erythema. Size/Enlargement- Bilateral- No enlargement. Discharge- bilateral-None.  Neck Neck- Supple. No Masses.   Chest and Lung  Exam Auscultation: Breath Sounds:-Clear even and unlabored.  Cardiovascular Auscultation:Rythm- Regular, rate and rhythm. Murmurs & Other Heart Sounds:Ausculatation of the heart reveal- No Murmurs.  Lymphatic Head & Neck General Head & Neck Lymphatics: Bilateral: Description- No Localized lymphadenopathy.       Assessment & Plan:  On exam you appear to have a sinus infection and left ear infection.  Your throat does also look suspicious for possible strep but your rapid strep test came back negative.  With your recent muscle aches we decided to do a flu test.  Flu test came back negative.  Since body aches did start within the last 24hours consider giving you Tamiflu since that would be in the treatment timeframe window but you had Tamiflu before and reported side effects so did not prescribe.  You have some loose stools recently.  If that persist let us know and we will get gastro panel/culture studies.  Recommend bland diet, hydrate well and use Imodium as needed.  For sinus infection and ear infection will prescribe azithromycin antibiotic.  For nasal congestion, I prescribed Flonase.  For cough I prescribed benzonatate.  If you have persisting severe cough with chest congestion then recommend notifying us and we will get chest x-ray.  Presently chest is clear.  Follow-up in 7 to 10 days or as needed.  For body aches recommend low-dose Tylenol or ibuprofen.  For wheezing making albuterol available.  If you are having to use albuterol every 4-6 hours then please let us know.  Pt mentioned some contact with daughter and friend who traveled to [place] recently. But those contacts have not been ill.  Esperanza Richters, PA-C

## 2018-04-07 ENCOUNTER — Ambulatory Visit: Payer: Self-pay

## 2018-04-07 ENCOUNTER — Other Ambulatory Visit: Payer: Self-pay

## 2018-04-07 ENCOUNTER — Encounter: Payer: Self-pay | Admitting: Family Medicine

## 2018-04-07 ENCOUNTER — Ambulatory Visit: Payer: BC Managed Care – PPO | Admitting: Family Medicine

## 2018-04-07 VITALS — BP 120/78 | HR 97 | Temp 99.4°F

## 2018-04-07 DIAGNOSIS — H6593 Unspecified nonsuppurative otitis media, bilateral: Secondary | ICD-10-CM | POA: Diagnosis not present

## 2018-04-07 DIAGNOSIS — J069 Acute upper respiratory infection, unspecified: Secondary | ICD-10-CM | POA: Diagnosis not present

## 2018-04-07 MED ORDER — PROMETHAZINE-DM 6.25-15 MG/5ML PO SYRP: 5.0000 mL | ORAL_SOLUTION | Freq: Four times a day (QID) | ORAL | 0 refills | Status: DC | PRN

## 2018-04-07 MED ORDER — PREDNISONE 20 MG PO TABS: 40.0000 mg | ORAL_TABLET | Freq: Every day | ORAL | 0 refills | Status: AC

## 2018-04-07 NOTE — Telephone Encounter (Signed)
Incoming  Call from  Patient with complaint of congestion , bad cough, pain in both ears.   Cough began on  Wednesday.   Rates it severe. Temperature : 97.3, Reports runny nose. Patient request an appointment with Dr.  Drue Novel.  Scheduled for 1:25 with Dr. Drue Novel.  Patient voices understanding.      3  Josely Dinelli Female, 68 y.o., 1950-05-04 MRN:  [id number] Phone:  [phone] Judie Petit) PCP:  Wanda Plump, MD Primary Cvg:  [employer] Next Appt With Internal Medicine 07/10/2018 at 3:30 PM Message from Fanny Bien sent at 04/07/2018 11:39 AM EDT   Summary: symptoms worse   Pt called and stated that she was seen on 04/05/18 and is still not feeling well and would like a call back regarding. Pt has a bad cough         Call History    Type Contact Phone  04/07/2018 11:38 AM Phone (Incoming) Cecile Sheerer, Ronalda (Self) [phone] (H)  User: Fanny Bien    Reason for Disposition . Cough has been present for > 3 weeks  Answer Assessment - Initial Assessment Questions 1. ONSET: "When did the cough begin?"      Wednesday   Last night was really bad.  2. SEVERITY: "How bad is the cough today?"      severe 3. RESPIRATORY DISTRESS: "Describe your breathing."    Feel tired  Feel tired and weak  4. FEVER: "Do you have a fever?" If so, ask: "What is your temperature, how was it measured, and when did it start?"     97.35. SPUTUM: "Describe the color of your sputum" (clear, white, yellow, green)     yellow 6. HEMOPTYSIS: "Are you coughing up any blood?" If so ask: "How much?" (flecks, streaks, tablespoons, etc.)      Denies  7. CARDIAC HISTORY: "Do you have any history of heart disease?" (e.g., heart attack, congestive heart failure)     Denies  8. LUNG HISTORY: "Do you have any history of lung disease?"  (e.g., pulmonary embolus, asthma, emphysema)      denies 9. PE RISK FACTORS: "Do you have a history of blood clots?" (or: recent  major surgery, recent prolonged travel, bedridden)     *No Answer*denies 10. OTHER SYMPTOMS: "Do you have any other symptoms?" (e.g., runny nose, wheezing, chest pain)      Runny nose 11. PREGNANCY: "Is there any chance you are pregnant?" "When was your last menstrual period?"       na 12. TRAVEL: "Have you traveled out of the country in the last month?" (e.g., travel history, exposures)       Denies.  Protocols used: COUGH - ACUTE PRODUCTIVE-A-AH

## 2018-04-07 NOTE — Progress Notes (Signed)
Chief Complaint  Patient presents with  . Cough  . Fever  . Nasal Congestion    Abigail [name] here for URI complaints.  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 Asthma   . Chiari malformation type I (HCC)   . Diabetes mellitus   . GERD (gastroesophageal reflux disease)   . Hx of gout   . Hyperlipidemia   . Hyperparathyroidism (HCC)   . Hypertension   . Lumbar radiculopathy   . Postablative hypothyroidism   . Sickle cell anemia (HCC)   . Spondylolysis   . Thyroid disease     BP 120/78 (BP Location: Left Arm, Patient Position: Sitting, Cuff Size: Large)   Pulse 97   Temp 99.4 F (37.4 C) (Oral)   SpO2 98%  General: Awake, alert, appears stated age HEENT: AT, [place], ears patent b/l and TMs w b/l serous fluid, no erythema or bulging, nares patent w/o discharge, pharynx pink and without exudates, MMM Neck: No masses or asymmetry Heart: RRR Lungs: CTAB, no accessory muscle use Psych: Age appropriate judgment and insight, normal mood and affect  Fluid level behind tympanic membrane of both ears - Plan: predniSONE (DELTASONE) 20 MG tablet  URI with cough and congestion - Plan: promethazine-dextromethorphan (PROMETHAZINE-DM) 6.25-15 MG/5ML syrup  Orders as above. Continue to push fluids, practice good hand hygiene, cover mouth when coughing. OK to finish abx.  F/u prn. If starting to experience fevers, shaking, or shortness of breath, seek immediate care. Pt voiced understanding and agreement to the plan.  Jilda Roche [place], DO 04/07/18 2:33 PM

## 2018-04-07 NOTE — Patient Instructions (Signed)
Continue to push fluids, practice good hand hygiene, and cover your mouth if you cough.  If you start having fevers, shaking or shortness of breath, seek immediate care.  For symptoms, consider using Vick's VapoRub on chest or under nose, air humidifier, Benadryl at night, and elevating the head of the bed. Tylenol and ibuprofen for aches and pains you may be experiencing.   OK to take Tylenol 1000 mg (2 extra strength tabs) or 975 mg (3 regular strength tabs) every 6 hours as needed.  Let us know if you need anything.  

## 2018-04-07 NOTE — Telephone Encounter (Signed)
Provider sick. Can you reschedule her w/ another provider?

## 2018-04-10 ENCOUNTER — Ambulatory Visit: Payer: BC Managed Care – PPO | Admitting: Internal Medicine

## 2018-05-12 ENCOUNTER — Telehealth: Payer: Self-pay

## 2018-05-12 NOTE — Telephone Encounter (Signed)
Abigail [name]- Pt due for follow-up, can you reach out to her to see if she will schedule virtual visit?

## 2018-05-12 NOTE — Telephone Encounter (Signed)
VM not set up will call again.

## 2018-05-17 NOTE — Telephone Encounter (Signed)
Abigail [name]- can you try contacting again please?

## 2018-05-19 ENCOUNTER — Other Ambulatory Visit: Payer: Self-pay

## 2018-05-19 ENCOUNTER — Encounter: Payer: Self-pay | Admitting: Internal Medicine

## 2018-05-19 ENCOUNTER — Ambulatory Visit (INDEPENDENT_AMBULATORY_CARE_PROVIDER_SITE_OTHER): Payer: BC Managed Care – PPO | Admitting: Internal Medicine

## 2018-05-19 DIAGNOSIS — J302 Other seasonal allergic rhinitis: Secondary | ICD-10-CM | POA: Diagnosis not present

## 2018-05-19 MED ORDER — AZELASTINE HCL 0.1 % NA SOLN: 2.0000 | Freq: Two times a day (BID) | NASAL | 6 refills | Status: DC

## 2018-05-19 MED ORDER — BENZONATATE 200 MG PO CAPS: 200.0000 mg | ORAL_CAPSULE | Freq: Three times a day (TID) | ORAL | 0 refills | Status: DC | PRN

## 2018-05-19 NOTE — Telephone Encounter (Signed)
Spoke with pt and pt was not feeling well, scheduled an appt today with provider (facetime)
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
## 2018-05-22 NOTE — Assessment & Plan Note (Signed)
Seasonal allergies: Symptoms indeed sound like seasonal allergies.  We will treat her as such, however if she gets worse, has fever, chills, other symptoms then she needs to call the office back.   Plan: Switch Allegra to Claritin Use Flonase daily, add Astelin Tessalon Perles 3 times daily as needed, they help in the past Robitussin-DM Albuterol only for wheezing. Call if not better. Detailed message sent with instructions.

## 2018-07-10 ENCOUNTER — Telehealth: Payer: Self-pay | Admitting: Internal Medicine

## 2018-07-10 ENCOUNTER — Other Ambulatory Visit: Payer: Self-pay

## 2018-07-10 ENCOUNTER — Ambulatory Visit (INDEPENDENT_AMBULATORY_CARE_PROVIDER_SITE_OTHER): Payer: BC Managed Care – PPO | Admitting: Internal Medicine

## 2018-07-10 ENCOUNTER — Encounter: Payer: Self-pay | Admitting: Internal Medicine

## 2018-07-10 VITALS — BP 120/76 | HR 80 | Ht 67.0 in | Wt 189.0 lb

## 2018-07-10 DIAGNOSIS — E559 Vitamin D deficiency, unspecified: Secondary | ICD-10-CM

## 2018-07-10 DIAGNOSIS — E119 Type 2 diabetes mellitus without complications: Secondary | ICD-10-CM | POA: Diagnosis not present

## 2018-07-10 DIAGNOSIS — E210 Primary hyperparathyroidism: Secondary | ICD-10-CM

## 2018-07-10 LAB — POCT GLYCOSYLATED HEMOGLOBIN (HGB A1C): Hemoglobin A1C: 7.1 % — AB (ref 4.0–5.6)

## 2018-07-10 NOTE — Patient Instructions (Addendum)
Please continue metformin ER 500 mg 3x a day.  Also, continue vitamin D 5000 units daily.  Please return in 4 months with your sugar log.

## 2018-07-10 NOTE — Telephone Encounter (Signed)
Error- please disregard

## 2018-07-10 NOTE — Telephone Encounter (Signed)
Abigail [name]  Returned Tashia's call. Patient will be at ph# [phone] on Tuesdays and Thursday.  Mon, Wed & Friday's patient is at ph# [phone]

## 2018-07-10 NOTE — Progress Notes (Signed)
Patient ID: Abigail [name], female   DOB: 09/05/1950, 68 y.o.   MRN: [id number]  HPI: Abigail [name] is a 68 y.o.-year-old female, returning for f/u for DM2, dx in ~2010, non-insulin-dependent, controlled, without complications and h/o primary HPTH.  She is the wife of [name], also my patient. Last visit 5 months ago.  She is very stressed due to the coronavirus pandemic and also the fact that her husband is in an assisted living facility (with dementia) and she cannot visit with him.  Her sugars and weight are higher.  DM2: Last hemoglobin A1c was: Lab Results  Component Value Date   HGBA1C 6.9 (A) 02/16/2018   HGBA1C 6.6 (A) 08/12/2017   HGBA1C 6.5 11/23/2016   Pt is on a regimen of: - Metformin ER 500 mg 2x >> 3x a day  Pt checks her sugars once a day: - am:  103-151, 155  >> 117-146, 154 >> 112-148, 166 - 2h after b'fast: 96, 149, 159 >> n/c >> 94-119, 145, 195 >> 106-115 - before lunch: n/c >> 105, 126 >> n/c >> 94 >> 120, 128, 167, 200 (no metformin) - 2h after lunch:117, 175 >> 140s >> n/c >> 162 >> n/c - before dinner: 100 >> 114, 184 >> n/c >> 94 >> 103 - 2h after dinner:  189 (ate late) >> n/c >> 97, 141 >> n/c - bedtime: n/c >> 132 >> n/c - nighttime: n/c Lowest sugar was 81 >> 93; she has hypoglycemia awareness in the 80s. Highest sugar was  195 >> 200.  Glucometer: ReliOn  Pt's meals are: - Breakfast: Fruit, eggs - Lunch: chicken/fish + veggies - no red meat  - Dinner: veggies + fruit +/- chicken/fish - Snacks: cashews, fruit (Mango)  -No CKD, last BUN/creatinine:  Lab Results  Component Value Date   BUN 10 07/20/2017   CREATININE 0.88 07/20/2017   -No HL; last set of lipids: Lab Results  Component Value Date   CHOL 178 08/12/2017   HDL 65.30 08/12/2017   LDLCALC 98 08/12/2017   TRIG 73.0 08/12/2017   CHOLHDL 3 08/12/2017  Not on a statin. - last eye exam was in 11/2016: No DR - + numbness and tingling in her feet.  She has a  history of surgery on her toe.  H/o primary hyperparathyroidism  - s/p 2x parathyroid glands resected in 2015, with resolution of her hypercalcemia She occasionally takes 1 Tums tablet when she feels weak  Calcium levels remain normal: Lab Results  Component Value Date   CALCIUM 9.2 07/20/2017   CALCIUM 9.1 07/21/2016   CALCIUM 8.8 02/20/2016   CALCIUM 9.1 12/22/2015   CALCIUM 9.2 07/09/2015   CALCIUM 9.2 01/02/2015   CALCIUM 10.4 05/03/2006   She has a history of vitamin D deficiency: Lab Results  Component Value Date   VD25OH 35.44 02/16/2018   VD25OH 14.17 (L) 08/12/2017   She continues on 5000 units vitamin D daily.  ROS: Constitutional: + weight gain/no weight loss, no fatigue, no subjective hyperthermia, no subjective hypothermia Eyes: no blurry vision, no xerophthalmia ENT: no sore throat, no nodules palpated in neck, no dysphagia, no odynophagia, no hoarseness Cardiovascular: no CP/no SOB/no palpitations/+ leg swelling Respiratory: no cough/no SOB/no wheezing Gastrointestinal: no N/no V/no D/no C/no acid reflux Musculoskeletal: + muscle aches/+ joint aches Skin: no rashes, no hair loss Neurological: no tremors/no numbness/no tingling/no dizziness  I reviewed pt's medications, allergies, PMH, social hx, family hx, and changes were documented in the history of present  illness. Otherwise, unchanged from my initial visit note.   Past Medical History:  Diagnosis Date  . Asthma   . Chiari malformation type I (HCC)   . Diabetes mellitus   . GERD (gastroesophageal reflux disease)   . Hx of gout   . Hyperlipidemia   . Hyperparathyroidism (HCC)   . Hypertension   . Lumbar radiculopathy   . Postablative hypothyroidism   . Sickle cell anemia (HCC)   . Spondylolysis   . Thyroid disease    Past Surgical History:  Procedure Laterality Date  . ABDOMINAL HYSTERECTOMY  1996   NO oophorectomy  . BREAST BIOPSY Left 2015   [facility]   . CERVICAL LAMINECTOMY     at C1 w/  duraplasty  . CRANIECTOMY SUBOCCIPITAL W/ CERVICAL LAMINECTOMY / CHIARI  07/05/2011   [facility] C[facility]  . PAR[facility]IDECTOMY  2015   [facility]    Social History   Social History  . Marital Status: Married    Spouse Name: N/A  . Number of Children: 1   Occupational History  . teacher - high school     Social History Main Topics  . Smoking status: Never Smoker   . Smokeless tobacco: Not on file  . Alcohol Use: No  . Drug Use: No   Social History Narrative   Original from [place]   Married x 44 years   Current Outpatient Medications on File Prior to Visit  Medication Sig Dispense Refill  . acetaminophen (TYLENOL) 500 MG tablet Take 1,000 mg by mouth every 6 (six) hours as needed.    Marland Kitchen. albuterol (PROVENTIL HFA;VENTOLIN HFA) 108 (90 Base) MCG/ACT inhaler Inhale 2 puffs into the lungs every 6 (six) hours as needed for wheezing or shortness of breath. 1 Inhaler 2  . azelastine (ASTELIN) 0.1 % nasal spray Place 2 sprays into both nostrils 2 (two) times daily. 30 mL 6  . benzonatate (TESSALON) 200 MG capsule Take 1 capsule (200 mg total) by mouth 3 (three) times daily as needed for cough. 30 capsule 0  . COLLAGEN PO Take by mouth. Take as directed    . fluticasone (FLONASE) 50 MCG/ACT nasal spray Place 2 sprays into both nostrils daily. 16 g 1  . glucose blood (ONE TOUCH ULTRA TEST) test strip Reported on 07/09/2015    . metFORMIN (GLUCOPHAGE-XR) 500 MG 24 hr tablet Take 1 tablet (500 mg total) by mouth 3 (three) times daily with meals. 270 tablet 3  . OVER THE COUNTER MEDICATION Health joint    . Potassium 99 MG TABS Take by mouth.     No current facility-administered medications on file prior to visit.    Allergies  Allergen Reactions  . Pollen Extract Itching  . Citrus Itching, Rash and Swelling  . Ibuprofen Swelling and Rash    Pt now states that she can not take ibuprofen  . Other Itching, Rash and Swelling    seafood. seafood   Family History  Problem Relation Age of  Onset  . Hypertension Sister        sister, daughter , mother   . Hyperlipidemia Sister   . Thyroid disease Sister   . Diabetes Daughter   . Stroke Mother   . Colon cancer Neg Hx   . Sudden death Neg Hx   . Heart attack Neg Hx   . Breast cancer Neg Hx   . Stomach cancer Neg Hx    PE: BP 120/76   Pulse 80   Ht 5\' 7"  (  1.702 m)   Wt 189 lb (85.7 kg)   SpO2 99%   BMI 29.60 kg/m  Wt Readings from Last 3 Encounters:  07/10/18 189 lb (85.7 kg)  04/05/18 184 lb (83.5 kg)  03/03/18 184 lb 8 oz (83.7 kg)   Constitutional: overweight, in NAD Eyes: PERRLA, EOMI, no exophthalmos ENT: moist mucous membranes, no thyromegaly, no cervical lymphadenopathy Cardiovascular: RRR, No MRG Respiratory: CTA B Gastrointestinal: abdomen soft, NT, ND, BS+ Musculoskeletal: no deformities, strength intact in all 4 Skin: moist, warm, no rashes Neurological: no tremor with outstretched hands, DTR normal in all 4  ASSESSMENT: 1. DM2, non-insulin-dependent, now more controlled, without complications  2. History of hyperparathyroidism -  status post 2 gland parathyroidectomy in 2015   3. Vit D def  PLAN:  1. Patient with fairly well-controlled type 2 diabetes, on oral antidiabetic regimen with metformin ER only.  She tolerates this well, without nausea or diarrhea.  At last visit, sugars were slightly higher due to the holidays, but they were still close to goal so we did not change her regimen.  Her highest sugars were in the morning and they were getting better during the day with 3 exceptions when she ate food.  We again discussed about the need to start exercise. -At this visit, sugars are higher as she has been less active and he has been eating more during the coronavirus pandemic.  She is also stressed as she cannot visit her husband in the assisted living facility.  Therefore, sugars are higher.  She also has dizziness and weakness when the sugars dropped to the 120s and I suspect that this is due  to increased insulin resistance.  We discussed about the pathology of this and ways to improve it.  She already started to change her diet and this is very important.  We discussed about further changes and also starting to walk more.  She will need to lose weight to improve her insulin sensitivity but for now, she would like to stay on the same regimen and continue to work on the above factors. - I suggested to:   Patient Instructions  Please continue metformin ER 500 3x a day.  Also, continue vitamin D 5000 units daily.  Please return in 4 months with your sugar log.   - today, HbA1c is 7.1% (higher) - continue checking sugars at different times of the day - check 1x a day, rotating checks - advised for yearly eye exams >> she is UTD - Return to clinic in 6 mo with sugar log    2. History of primary hyperparathyroidism -She is status post parathyroidectomy -Most recent calcium levels were normal -She takes calcium (Tums) when she feels weak and tired -At last visit we check her calcium and vitamin D and these were normal  3. Vit D def -Vitamin D was low in the past and we started 5000 units vitamin D daily -At last check, it normalized so we continued the above dose -We will recheck at next visit   Philemon Kingdom, MD PhD [facility] Endocrinology

## 2018-07-19 ENCOUNTER — Encounter: Payer: Self-pay | Admitting: Neurology

## 2018-07-19 ENCOUNTER — Telehealth: Payer: Self-pay | Admitting: Neurology

## 2018-07-19 NOTE — Telephone Encounter (Signed)
Called the patient to make her aware that Dr Brett Fairy will only being doing Video visits in the afternoon on 6/29. There was no answer and the VM was full unable to Leave a message I will send a mychart message to the patient as well.

## 2018-07-24 ENCOUNTER — Ambulatory Visit: Payer: BC Managed Care – PPO | Admitting: Neurology

## 2018-07-25 ENCOUNTER — Ambulatory Visit: Payer: BC Managed Care – PPO | Admitting: Neurology

## 2018-07-25 ENCOUNTER — Other Ambulatory Visit: Payer: Self-pay

## 2018-07-25 ENCOUNTER — Encounter: Payer: Self-pay | Admitting: Neurology

## 2018-07-25 VITALS — BP 152/85 | HR 68 | Temp 97.8°F | Ht 67.0 in | Wt 183.0 lb

## 2018-07-25 DIAGNOSIS — Z8669 Personal history of other diseases of the nervous system and sense organs: Secondary | ICD-10-CM

## 2018-07-25 DIAGNOSIS — E119 Type 2 diabetes mellitus without complications: Secondary | ICD-10-CM

## 2018-07-25 DIAGNOSIS — M542 Cervicalgia: Secondary | ICD-10-CM

## 2018-07-25 DIAGNOSIS — E210 Primary hyperparathyroidism: Secondary | ICD-10-CM

## 2018-07-25 DIAGNOSIS — Z8739 Personal history of other diseases of the musculoskeletal system and connective tissue: Secondary | ICD-10-CM

## 2018-07-25 DIAGNOSIS — G935 Compression of brain: Secondary | ICD-10-CM

## 2018-07-25 DIAGNOSIS — D571 Sickle-cell disease without crisis: Secondary | ICD-10-CM

## 2018-07-25 NOTE — Patient Instructions (Signed)
  Chiari Malformation  Chiari malformation (CM) is a type of brain abnormality that affects the parts of the brain called the cerebellum and the brain stem. The cerebellum is important for balance and the brain stem is important for basic body functions, such as breathing and swallowing. Normally, the cerebellum is located in a space at the back of the skull, just above the opening in the skull (foramen magnum)where the spinal cord meets the brain stem. With CM, part of the cerebellum is located below the foramen magnum instead. The malformation can be mild with no or few symptoms, or it can be severe. CM can cause neck pain, headaches, balance problems, and other symptoms. What are the causes? CM is a condition that a person is born with (congenital). In rare cases, CM may also develop later in life (acquired CM or secondary CM). These cases may be caused by a leak of the fluid (cerebrospinal fluid) around the brain and spinal cord, leading to low pressure. In acquired or secondary CM, abnormal pressure develops in the brain. This pushes the cerebellum down into the foramen magnum. What increases the risk? The following factors may make you more likely to develop this condition:  Being female.  Having a family history of CM. What are the signs or symptoms? Symptoms of this condition may vary depending on the severity of your CM. In some cases, you may not have any symptoms. In other cases, symptoms may come and go. The most common symptom is a severe headache in the back of the head. The headache:  May come and go.  May spread to your neck and shoulders.  May be worse when you cough, sneeze, or strain. Other symptoms include:  Difficulty balancing.  Loss of coordination.  Trouble swallowing or speaking.  Muscle weakness.  Feeling dizzy.  Ringing in the ears.  Fainting.  Trouble sleeping.  Fatigue.  Tingling or burning sensations in the fingers or toes.  Hearing  problems.  Vision problems.  Vomiting.  Depression.  Seizures, in severe cases. How is this diagnosed? This condition may be evaluated with a medical history and physical exam. This may include tests to check your balance and nerves (neurological exam). You may also have imaging tests, such as CT scan or MRI. How is this treated? Treatment for this condition depends on the severity of your symptoms. You may be treated with:  Surgery to prevent the malformation from getting worse, or to treat severe symptoms or symptoms that are getting worse.  Medicines or alternative treatments to relieve headaches or neck pain. If you do not have symptoms, you may not need treatment. Follow these instructions at home: If you have seizures:  Do not drive, swim, or do any other activities that would be dangerous if you had a seizure. Wait until your health care provider says it is safe to do them.  Avoid any substances that may prevent your medicine from working properly, such as alcohol.  Check with your local Department of Motor Vehicles (DMV) to find out about local driving laws. Each state has specific rules about when you can legally return to driving.  Get enough rest. Lack of sleep can make seizures more likely to occur. Medicines  Take over-the-counter and prescription medicines only as told by your health care provider.  Do not drive or use heavy machinery while taking prescription pain medicine.  If you are taking blood pressure or heart medicine, get up slowly and take several minutes to sit   stand. This can reduce dizziness. General instructions  If you feel like you might faint: ? Lie down right away and raise (elevate) your feet above the level of your heart. ? Breathe deeply and steadily. Wait until all of the symptoms have passed.  Ask your health care provider which activities are safe for you, and if you have any activity restrictions.  Do not use any products that contain  nicotine or tobacco, such as cigarettes and e-cigarettes. If you need help quitting, ask your health care provider.  Drink enough fluid to keep your urine pale yellow.  Consider joining a CM support group.  Keep all follow-up visits as told by your health care provider. This is important. Contact a health care provider if:  You have new symptoms.  Your symptoms get worse. Get help right away if:  You have seizures that are new or different from other seizures that you have had.  You develop weakness or numbness in one or all of your limbs.  You develop dizziness, slurred speech, double vision, weakness, or numbness with a severe headache. Summary  A Chiari malformation is a condition in which part of the cerebellum moves through the foramen magnum.  The malformation can be mild with no symptoms, or it can be severe.  In some patients, no treatment is needed. In others, medicines are used to treat headaches. Surgery is done in the worst of cases. This information is not intended to replace advice given to you by your health care provider. Make sure you discuss any questions you have with your health care provider. Document Released: 01/01/2002 Document Revised: 01/13/2017 Document Reviewed: 10/20/2016 Elsevier Patient Education  2020 [employer].

## 2018-07-25 NOTE — Progress Notes (Signed)
SLEEP MEDICINE CLINIC   Provider:  Melvyn Novas, M D  Referring Provider: Wanda Plump, MD Primary Care Physician:  Wanda Plump, MD  Chief Complaint  Patient presents with  . Follow-up    pt alone, rm 10. pt states that she has been having a lot of pain in back of neck area. she had compression surgery in the past posterior head and neck. she states that she is having a lot of the same symptoms she had prior to the surgery. she states that she has a hard time with balance. she has a hard time with reaching up and restricted to pain. she states she has found she is having to use the cane more then in past.    HPI:  Marbella Markgraf is a 68 y.o. female ,and I follow her husband , too. He lives in a facility where  48 employees tested positive - all asymptomatic. She has not seen him face to face since March 13 th. He understands but still feels lonely.  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   The patient has surgery in 06-2011 for Chiari malformation, and was symptom free for years -she has now symptoms again, she takes Tylenol as needed.  She is doing well overall but she reports neck and balance problems. It is not something I usually treat and I asked Dr. Lucia Gaskins ( neuromuscular and headaches) to see her. I will repeat an MRI study, brain and cervical spine.         Revisit today on 07-30-2017 , for a new problem. I have known Mrs. Gwyneth Sprout for over 5 years through her husband Domingo Cocking. Domingo Cocking, a brittle diabetic ,  developed dementia with personality changes,  and diabetes related vascular disease, lost his right leg, BKA and the left hallux was amputated, now forefoot. He is incontinent.  He is losing vision - retinopathy of diabetic origin. He had to move to a facility. His daughter lives in [place], and had come for two 90 day  visits in 2018, but is now not allowed to return to help.  She is quite tearful as she reports this dilemma, she is clearly depressed and feels she has not done all she needs for him. - she is here for ill defined pains and discomforts. Right shoulder pain, neck stiffness, hand pain and due to that multiple imaging studies were performed.  Dr. Drue Novel provided the results and I was able to review the studies in her presence.     2017 : originally seen here as a referral from Dr. Drue Novel for a sleep evaluation.  Mrs. Tejada-de Lequita Halt is the main caretaker of her husband , who suffers from uncontrolled diabetes and progressive dementia and now requieres supervision and assistance in many AOLs. 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 She reports that after a usual day at work she prepares all meals does all the housekeeping and usually is in bed not earlier than midnight only to rise again at 4 AM and preparing her breakfast and setting the table etc. for her husband before she leaves for work. He stays home all day. He stays home alone. It will take  her hours to get her husband into the shower, to get him dressed to get him motivated which is coached to do any activity and not stay in bed. She is afraid of the school year starting again as she doesn't know what to do about her husband's passivity. He is also very easily confused and distracted and he gets lost within the confines of his own home. In short, I think Mrs. Jolee Ewingde Morgan's fatigue is sufficiently explained by the social situation. The dementia has advanced very fast unexpectedly fast. His diabetes treatment also recently changed his insurance changed the available medication from insulin Lantus to another medication to which she seems not to respond very well. I have suggested an adult center for enrichment or other adult day  care facility and apparently the couple has visited the one on [address] but Mr. Laverle PatterDamato was not very sure that he wants to be there. And his wife also agrees that it was a depressing visit.  Chief complaint according to patient : "I am overwhelmed "  Sleep medical history and family sleep history: Sickle cell trait.  Gastroesophageal reflux disease, history of gout, hyperlipidemia, post ablative hypoparathyroidism and thyroid disease, spondylosis, degenerative disc disease, asthma, Chiari malformation type I and diabetes mellitus.  Social history: Mrs. Gwyneth Sproutejada was the main caretaker for her husband who has multiple medical problems which has culminated in cognitive and physical disabilities.  She also had to lift, to bend, to help him dress, he was incontinent, and the caretaking duties were on top of her full-time employment.  Domingo Cockingduardo has now moved into a facility which and burdens her physically but she is mentally still very much affected by this.  Married to a retired Engineer, siteschool teacher from [place]. He is a Control and instrumentation engineerpanish-speaking and works as a Psychologist, forens[place]igh school teacher [place].  One daughter age 66 . 45 years of marriage.  Master's degree , was working on PhD.   Review of Systems: Out of a complete 14 system review, the patient complains of only the following symptoms, and all other reviewed systems are negative. Her husband a dry though reported that she sometimes snores, we have no more reliable account of her sleeping. She does not wake up gasping for air she is usually not having palpitations she's not diaphoretic she does not have frequent nocturia and external circumstances limit her ability to sleep.   Epworth score  7, Fatigue severity score 25  , depression score 3/15    Social History   Socioeconomic History  . Marital status: Married    Spouse name: Not on file  . Number of children: 1  . Years of education: Not on file  . Highest education level: Not on file   Occupational History  . Occupation: Runner, broadcasting/film/videoteacher , high school  Social Needs  . Financial resource strain: Not on file  . Food insecurity    Worry: Not on file    Inability: Not on file  . Transportation needs    Medical: Not on file    Non-medical: Not on file  Tobacco Use  . Smoking status: Never Smoker  . Smokeless tobacco: Never Used  Substance and Sexual Activity  . Alcohol use: No  . Drug use: No  . Sexual activity: Not Currently    Partners: Male    Comment: 1st intercourse- [id number], married- 46 yrs   Lifestyle  . Physical activity    Days per week: Not on file    Minutes per session:  Not on file  . Stress: Not on file  Relationships  . Social Musicianconnections    Talks on phone: Not on file    Gets together: Not on file    Attends religious service: Not on file    Active member of club or organization: Not on file    Attends meetings of clubs or organizations: Not on file    Relationship status: Not on file  . Intimate partner violence    Fear of current or ex partner: Not on file    Emotionally abused: Not on file    Physically abused: Not on file    Forced sexual activity: Not on file  Other Topics Concern  . Not on file  Social History Narrative   Original from [place]   Married x 44 years   1 daughter     Family History  Problem Relation Age of Onset  . Hypertension Sister        sister, daughter , mother   . Hyperlipidemia Sister   . Thyroid disease Sister   . Diabetes Daughter   . Stroke Mother   . Colon cancer Neg Hx   . Sudden death Neg Hx   . Heart attack Neg Hx   . Breast cancer Neg Hx   . Stomach cancer Neg Hx     Past Medical History:  Diagnosis Date  . Asthma   . Chiari malformation type I (HCC)   . Diabetes mellitus   . GERD (gastroesophageal reflux disease)   . Hx of gout   . Hyperlipidemia   . Hyperparathyroidism (HCC)   . Hypertension   . Lumbar radiculopathy   . Postablative hypothyroidism   . Sickle cell anemia (HCC)   .  Spondylolysis   . Thyroid disease     Past Surgical History:  Procedure Laterality Date  . ABDOMINAL HYSTERECTOMY  1996   NO oophorectomy  . BREAST BIOPSY Left 2015   [facility]   . CERVICAL LAMINECTOMY     at C1 w/ duraplasty  . CRANIECTOMY SUBOCCIPITAL W/ CERVICAL LAMINECTOMY / CHIARI  07/05/2011   [facility] C[facility]  . PAR[facility]IDECTOMY  2015   [facility]     Current Outpatient Medications  Medication Sig Dispense Refill  . acetaminophen (TYLENOL) 500 MG tablet Take 1,000 mg by mouth every 6 (six) hours as needed.    Marland Kitchen. glucose blood (ONE TOUCH ULTRA TEST) test strip Reported on 07/09/2015    . metFORMIN (GLUCOPHAGE-XR) 500 MG 24 hr tablet Take 1 tablet (500 mg total) by mouth 3 (three) times daily with meals. 270 tablet 3  . OVER THE COUNTER MEDICATION Health joint    . Potassium 99 MG TABS Take by mouth.     No current facility-administered medications for this visit.     Allergies as of 07/25/2018 - Review Complete 07/25/2018  Allergen Reaction Noted  . Pollen extract Itching 04/19/2013  . Citrus Itching, Rash, and Swelling 12/06/2014  . Ibuprofen Swelling and Rash 11/12/2010  . Other Itching, Rash, and Swelling 12/06/2014    Vitals: BP (!) 152/85   Pulse 68   Temp 97.8 F (36.6 C)   Ht 5\' 7"  (1.702 m)   Wt 183 lb (83 kg)   BMI 28.66 kg/m  Last Weight:  Wt Readings from Last 1 Encounters:  07/25/18 183 lb (83 kg)   ZOX:WRUEBMI:Body mass index is 28.66 kg/m.     Last Height:   Ht Readings from Last 1 Encounters:  07/25/18 5'  7" (1.702 m)    Physical exam:  General: The patient is awake, alert and appears not in acute distress. The patient is well groomed. Head: Normocephalic, atraumatic. Neck is supple. Mallampati 1,  neck circumference:14.75 . Nasal airflow patent  Cardiovascular:  Regular rate and rhythm , without  murmurs or carotid bruit, and without distended neck veins. Respiratory: Lungs are clear to auscultation. Skin:  Without evidence of edema, or rash  Trunk: BMI 29. The patient's posture is erect   Neurologic exam : The patient is awake and alert, oriented to place and time.   Attention span & concentration ability appears normal.  Speech is fluent,  without dysarthria, dysphonia or aphasia.  Mood and affect are appropriate.  Cranial nerves: Pupils are equal and briskly reactive to light. Funduscopic exam without  evidence of pallor or edema.  Extraocular movements  in vertical and horizontal planes intact and without nystagmus. Visual fields by finger perimetry are intact. Hearing to finger rub intact. Facial sensation intact to fine touch.Facial motor strength is symmetric and tongue and uvula move midline. Shoulder shrug was symmetrical.   Motor exam:  Normal tone, muscle bulk and symmetric strength in all extremities. Her ROM over the right shoulder is limited due to pain, there is crackling. She has bilateral weakness of pinch and grip.   Sensory:  Fine touch, pinprick and vibration were intact in both feet - Proprioception tested in the upper extremities was normal. Coordination: Rapid alternating movements / Finger-to-nose maneuver without evidence of ataxia, dysmetria or tremor.  Gait and station: Patient walks with a cane ( her husbands cane ) as assistive device and is unable to stand up from a seated position, she needed to brace herself .  Strength (may be pain related) decreased in general  Stance is stable and normal. She walks shuffling with lacing the left leg first and sliding the right leg after. Barely lifts her feet. Toe and heel stand were deferred. Tandem gait deferred,  Turns with 3 -4 Steps. Romberg testing is negative.  Deep tendon reflexes: in the upper and lower extremities are symmetric and intact. Babinski maneuver response is downgoing.   An MRI of the cervical spine was obtained on 23 July 2017, resulting in cervical spondylosis, Arnold-Chiari 1 malformation prior suboccipital decompression compression  surgery no syrinx is identified, there is a mild impingement of C3-4 in comparison to a 2012 exam, and there is a moderate impingement at C3-4.  Mild impingement also between fourth and fifth cervical vertebrae fifth and 6th, and 6 and 7th. Bilateral hand x-rays were negative obtained in early May 2019. MRI of the brain was obtained on 07-23-2017, MRI noncontrast showed normal flow voids, no acute intracranial abnormality, chronic postsurgical changes in relation to the Chiari I malformation surgery with suboccipital craniectomy and C1 laminectomy.  There are some hyperintense foci in the bilateral frontal lobe white matter this is chronic microvascular disease.  None of this explains the diffuse body pain weakness for the last 4 months. The patient was advised of the nature of the diagnosed sleep disorder , the treatment options and risks for general a health and wellness arising from not treating the condition.    I spent more than 60  minutes of face to face time with the patient. Greater than 50% of time was spent in counseling and coordination of care. We have discussed the diagnosis and differential and I answered the patient's questions.     Assessment:   After physical and neurologic  examination, review of laboratory studies,  Personal review of imaging studies, reports of other /same  Imaging studies ,    1) Chiari malformation- symptoms returned. She is doing well overall but she reports neck and balance problems. It is not something I usually treat and I asked Dr. Jaynee Eagles ( neuromuscular and headaches) to see her. I will repeat an MRI study, brain and cervical spine.   I recommend gait stability exercises with PT , and massage therapy to loosen deep tissue.  I will refer to Dr Oneida Alar, Sport medicine at [facility] for an [place] evaluation of the right shoulder - seems to be ROM restricted.   2) depression treatment - needed ASAP;  Wellbutrin/ Zoloft - needs a weight neutral option.  .     Plan:  Treatment plan and additional workup :  RV prn.  MRI cervical spine and MRI brain.   Asencion Partridge Jerry Haugen MD  07/25/2018   CC: [address], [address] [place],  [place] [id number]

## 2018-07-26 ENCOUNTER — Telehealth: Payer: Self-pay | Admitting: Neurology

## 2018-07-26 LAB — COMPREHENSIVE METABOLIC PANEL
ALT: 17 IU/L (ref 0–32)
AST: 19 IU/L (ref 0–40)
Albumin/Globulin Ratio: 2.2 (ref 1.2–2.2)
Albumin: 5 g/dL — ABNORMAL HIGH (ref 3.8–4.8)
Alkaline Phosphatase: 82 IU/L (ref 39–117)
BUN/Creatinine Ratio: 18 (ref 12–28)
BUN: 17 mg/dL (ref 8–27)
Bilirubin Total: 0.3 mg/dL (ref 0.0–1.2)
CO2: 25 mmol/L (ref 20–29)
Calcium: 10.3 mg/dL (ref 8.7–10.3)
Chloride: 99 mmol/L (ref 96–106)
Creatinine, Ser: 0.94 mg/dL (ref 0.57–1.00)
GFR calc Af Amer: 73 mL/min/{1.73_m2} (ref >59)
GFR calc non Af Amer: 63 mL/min/{1.73_m2} (ref >59)
Globulin, Total: 2.3 g/dL (ref 1.5–4.5)
Glucose: 101 mg/dL — ABNORMAL HIGH (ref 65–99)
Potassium: 4.7 mmol/L (ref 3.5–5.2)
Sodium: 141 mmol/L (ref 134–144)
Total Protein: 7.3 g/dL (ref 6.0–8.5)

## 2018-07-26 NOTE — Telephone Encounter (Signed)
-----   Message from Larey Seat, MD sent at 07/26/2018  1:20 PM EDT ----- This is a normal result for a comprehesive metabolic panel , the patient had not been fasting, yet her blood glucose was close to normal. Normal kidney and liver function allowing for contrast agent to be used in MRI brain and cervical spine.

## 2018-07-26 NOTE — Telephone Encounter (Signed)
Called the patient and reviewed the lab work with her. Advised they were normal and there was nothing of concern. Advised these labs are good for her to have the MRI completed. Pt verbalized understanding. Pt had no questions at this time but was encouraged to call back if questions arise.

## 2018-07-31 ENCOUNTER — Telehealth: Payer: Self-pay | Admitting: Neurology

## 2018-07-31 NOTE — Telephone Encounter (Signed)
no to the covid-19 questions MR Brain w/wo contrast & MR Cervical spine w/wo contrast Dr. Rolla Etienne Auth: [id number] (exp. 07/27/18 to 01/22/19). Patient is scheduled at [facility] for 08/09/18.

## 2018-08-01 NOTE — Telephone Encounter (Signed)
pt cancel will call back to r/s.

## 2018-08-09 ENCOUNTER — Other Ambulatory Visit: Payer: BC Managed Care – PPO

## 2018-08-11 ENCOUNTER — Other Ambulatory Visit: Payer: Self-pay

## 2018-08-11 ENCOUNTER — Encounter: Payer: Self-pay | Admitting: Internal Medicine

## 2018-08-11 ENCOUNTER — Ambulatory Visit: Payer: Self-pay | Admitting: Internal Medicine

## 2018-08-11 ENCOUNTER — Ambulatory Visit (INDEPENDENT_AMBULATORY_CARE_PROVIDER_SITE_OTHER): Payer: BC Managed Care – PPO | Admitting: Internal Medicine

## 2018-08-11 DIAGNOSIS — R6889 Other general symptoms and signs: Secondary | ICD-10-CM

## 2018-08-11 DIAGNOSIS — Z20822 Contact with and (suspected) exposure to covid-19: Secondary | ICD-10-CM

## 2018-08-11 DIAGNOSIS — B349 Viral infection, unspecified: Secondary | ICD-10-CM | POA: Diagnosis not present

## 2018-08-11 NOTE — Telephone Encounter (Signed)
Virtual visit scheduled.  
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
## 2018-08-11 NOTE — Telephone Encounter (Signed)
Pt. Reports she started feeling bad yesterday. Cough, runny nose, shortness of breath, loss of taste, fatigue. Request virtual visit. Warm transfer to [place] in the practice.  Answer Assessment - Initial Assessment Questions 1. COVID-19 DIAGNOSIS: "Who made your Coronavirus (COVID-19) diagnosis?" "Was it confirmed by a positive lab test?" If not diagnosed by a HCP, ask "Are there lots of cases (community spread) where you live?" (See public health department website, if unsure)     No test 2. ONSET: "When did the COVID-19 symptoms start?"      Started yesterday 3. WORST SYMPTOM: "What is your worst symptom?" (e.g., cough, fever, shortness of breath, muscle aches)     Shortness of breath, cough,runny nose 4. COUGH: "Do you have a cough?" If so, ask: "How bad is the cough?"       Yes - dry cough 5. FEVER: "Do you have a fever?" If so, ask: "What is your temperature, how was it measured, and when did it start?"     No 6. RESPIRATORY STATUS: "Describe your breathing?" (e.g., shortness of breath, wheezing, unable to speak)      Shortness of breath - mild 7. BETTER-SAME-WORSE: "Are you getting better, staying the same or getting worse compared to yesterday?"  If getting worse, ask, "In what way?"     Worse 8. HIGH RISK DISEASE: "Do you have any chronic medical problems?" (e.g., asthma, heart or lung disease, weak immune system, etc.)     Diabetes 9. PREGNANCY: "Is there any chance you are pregnant?" "When was your last menstrual period?"     No 10. OTHER SYMPTOMS: "Do you have any other symptoms?"  (e.g., chills, fatigue, headache, loss of smell or taste, muscle pain, sore throat)       Tired  Protocols used: CORONAVIRUS (COVID-19) DIAGNOSED OR SUSPECTED-A-AH

## 2018-08-12 NOTE — Assessment & Plan Note (Signed)
Viral syndrome: In the midst of a COVID-19 pandemia I suspect covid as culprit. Plan: Supportive treatment with Tylenol, Flonase, Robitussin-DM.  She has leftover albuterol and Tessalon that is okay to use. Instruction provided to when is appropriate to go to the ER, if she is worse. Advised that she is contagious for 2 weeks and as long as she become asymptomatic. Instructions orders for COVID-19 testing sent to the [facility]. Detail email sent.

## 2018-08-16 LAB — NOVEL CORONAVIRUS, NAA: SARS-CoV-2, NAA: NOT DETECTED

## 2018-09-01 ENCOUNTER — Encounter: Payer: Self-pay | Admitting: Internal Medicine

## 2018-09-01 ENCOUNTER — Other Ambulatory Visit: Payer: Self-pay

## 2018-09-01 ENCOUNTER — Ambulatory Visit (INDEPENDENT_AMBULATORY_CARE_PROVIDER_SITE_OTHER): Payer: BC Managed Care – PPO | Admitting: Internal Medicine

## 2018-09-01 VITALS — BP 138/75 | HR 69 | Temp 98.6°F | Resp 18 | Ht 67.0 in | Wt 183.0 lb

## 2018-09-01 DIAGNOSIS — D571 Sickle-cell disease without crisis: Secondary | ICD-10-CM

## 2018-09-01 DIAGNOSIS — Z0000 Encounter for general adult medical examination without abnormal findings: Secondary | ICD-10-CM

## 2018-09-01 DIAGNOSIS — E079 Disorder of thyroid, unspecified: Secondary | ICD-10-CM

## 2018-09-01 DIAGNOSIS — G935 Compression of brain: Secondary | ICD-10-CM

## 2018-09-01 DIAGNOSIS — U071 COVID-19: Secondary | ICD-10-CM

## 2018-09-01 DIAGNOSIS — E785 Hyperlipidemia, unspecified: Secondary | ICD-10-CM

## 2018-09-01 DIAGNOSIS — E119 Type 2 diabetes mellitus without complications: Secondary | ICD-10-CM

## 2018-09-01 MED ORDER — ATORVASTATIN CALCIUM 10 MG PO TABS: 10.0000 mg | ORAL_TABLET | Freq: Every day | ORAL | 3 refills | Status: DC

## 2018-09-01 MED ORDER — SHINGRIX 50 MCG/0.5ML IM SUSR: 0.5000 mL | Freq: Once | INTRAMUSCULAR | 1 refills | Status: AC

## 2018-09-01 NOTE — Patient Instructions (Addendum)
GO TO THE LAB : Get the blood work     GO TO THE FRONT DESK Schedule labs to be done 6 to 8 weeks from today to check your cholesterol  Schedule your next appointment   for a physical exam in 1 year   Start taking atorvastatin 10 mg 1 tablet daily for cholesterol  Return the stool card

## 2018-09-01 NOTE — Assessment & Plan Note (Signed)
--   Td 04-2015 - pnm 23: 2016 - prevnar: 2019 - Shingrix discussed, rx printed  - rec flu shot  -- Female care: saw gyn 08-2015, PAP-HPV (-), per note no further PAPs, plans to see gyn 10/2018; s/p  MMG 10/2017 per [facility] --[facility]: 07-2012 , Dr Alice Reichert, per report no polyps, next cscope 10 years, iFOB in 5 years.  She agreed to proceed with stool test. --T score -0.9 (04-2015) --Labs: FLP, CBC, TSH, micro, IgG for coronavirus, IFoB. --Diet and exercise discussed.  She reports gluten intolerance lately.
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
## 2018-09-01 NOTE — Progress Notes (Signed)
Pre visit review using our clinic review tool, if applicable. No additional management support is needed unless otherwise documented below in the visit note. 

## 2018-09-02 LAB — LIPID PANEL
Cholesterol: 166 mg/dL (ref <200)
HDL: 58 mg/dL (ref >=50)
LDL Cholesterol (Calc): 87 mg/dL (calc)
Non-HDL Cholesterol (Calc): 108 mg/dL (calc) (ref <130)
Total CHOL/HDL Ratio: 2.9 (calc) (ref <5.0)
Triglycerides: 110 mg/dL (ref <150)

## 2018-09-02 LAB — CBC WITH DIFFERENTIAL/PLATELET
Absolute Monocytes: 413 cells/uL (ref 200–950)
Basophils Absolute: 41 cells/uL (ref 0–200)
Basophils Relative: 0.8 %
Eosinophils Absolute: 184 cells/uL (ref 15–500)
Eosinophils Relative: 3.6 %
HCT: 36.3 % (ref 35.0–45.0)
Hemoglobin: 11.8 g/dL (ref 11.7–15.5)
Lymphs Abs: 1897 cells/uL (ref 850–3900)
MCH: 28.4 pg (ref 27.0–33.0)
MCHC: 32.5 g/dL (ref 32.0–36.0)
MCV: 87.3 fL (ref 80.0–100.0)
MPV: 10.1 fL (ref 7.5–12.5)
Monocytes Relative: 8.1 %
Neutro Abs: 2565 cells/uL (ref 1500–7800)
Neutrophils Relative %: 50.3 %
Platelets: 271 10*3/uL (ref 140–400)
RBC: 4.16 10*6/uL (ref 3.80–5.10)
RDW: 13.6 % (ref 11.0–15.0)
Total Lymphocyte: 37.2 %
WBC: 5.1 10*3/uL (ref 3.8–10.8)

## 2018-09-02 LAB — SAR COV2 SEROLOGY (COVID19)AB(IGG),IA: SARS CoV2 AB IGG: NEGATIVE

## 2018-09-02 LAB — MICROALBUMIN / CREATININE URINE RATIO
Creatinine, Urine: 52 mg/dL (ref 20–275)
Microalb, Ur: <0.2 mg/dL

## 2018-09-02 LAB — TSH: TSH: 2.11 mIU/L (ref 0.40–4.50)

## 2018-09-03 NOTE — Assessment & Plan Note (Signed)
DM: Per Endo, foot exam negative, check a micro. Dyslipidemia: Her cholesterol is okay but guidelines recommend starting starting on patients with diabetes regardless of cholesterol levels, pro, cons, side effects discussed, we agreed to start atorvastatin and recheck labs in 6 to 8 weeks. History of thyroid disease: Checking TSH Chiari malformation: Symptoms returned, saw neurology 07/25/2018, MRI of the brain and cervical spine ordered.  Was recommended to see another neurologist.patient declined," MRI is very expensive, pain is not severe" and also states:  "I would never accept another surgery".    COVID-19: Likes to check a IgG, limitation of the test discussed, even if it is negative she should not change her precautions at all.  She verbalized understanding RTC for labs 6 to 8 weeks (starting statins today), physical in 1 year

## 2018-09-25 ENCOUNTER — Ambulatory Visit (INDEPENDENT_AMBULATORY_CARE_PROVIDER_SITE_OTHER): Payer: BC Managed Care – PPO | Admitting: Internal Medicine

## 2018-09-25 ENCOUNTER — Telehealth: Payer: Self-pay

## 2018-09-25 ENCOUNTER — Other Ambulatory Visit: Payer: Self-pay

## 2018-09-25 ENCOUNTER — Encounter: Payer: Self-pay | Admitting: Adult Health

## 2018-09-25 ENCOUNTER — Ambulatory Visit: Payer: BC Managed Care – PPO | Admitting: Family Medicine

## 2018-09-25 DIAGNOSIS — Z20822 Contact with and (suspected) exposure to covid-19: Secondary | ICD-10-CM

## 2018-09-25 DIAGNOSIS — Z20828 Contact with and (suspected) exposure to other viral communicable diseases: Secondary | ICD-10-CM | POA: Diagnosis not present

## 2018-09-25 DIAGNOSIS — B349 Viral infection, unspecified: Secondary | ICD-10-CM | POA: Diagnosis not present

## 2018-09-25 NOTE — Telephone Encounter (Signed)
Patient was a no call/no show for their appointment today.   

## 2018-09-25 NOTE — Assessment & Plan Note (Signed)
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  The patient developed symptoms 2 days ago, it is entirely possible she has COVID-19. Recommend rest, fluids, Tylenol, Imodium OTC, cough medicine. Will arrange COVID-19 testing Indication to the ER carefully discussed with the patient. She was recommended by the principal to stay at home for 14 days which I agree with. Wonders if she is able to continue working on [employer] home.  

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
## 2018-09-26 ENCOUNTER — Other Ambulatory Visit: Payer: Self-pay

## 2018-09-26 DIAGNOSIS — Z20822 Contact with and (suspected) exposure to covid-19: Secondary | ICD-10-CM

## 2018-09-28 LAB — NOVEL CORONAVIRUS, NAA: SARS-CoV-2, NAA: NOT DETECTED

## 2018-10-03 ENCOUNTER — Other Ambulatory Visit (INDEPENDENT_AMBULATORY_CARE_PROVIDER_SITE_OTHER): Payer: BC Managed Care – PPO

## 2018-10-03 DIAGNOSIS — Z0000 Encounter for general adult medical examination without abnormal findings: Secondary | ICD-10-CM | POA: Diagnosis not present

## 2018-10-03 LAB — FECAL OCCULT BLOOD, IMMUNOCHEMICAL: Fecal Occult Bld: NEGATIVE

## 2018-10-11 ENCOUNTER — Other Ambulatory Visit: Payer: Self-pay

## 2018-10-11 ENCOUNTER — Other Ambulatory Visit (INDEPENDENT_AMBULATORY_CARE_PROVIDER_SITE_OTHER): Payer: BC Managed Care – PPO

## 2018-10-11 DIAGNOSIS — E785 Hyperlipidemia, unspecified: Secondary | ICD-10-CM | POA: Diagnosis not present

## 2018-10-11 LAB — LIPID PANEL
Cholesterol: 183 mg/dL (ref 0–200)
HDL: 65.6 mg/dL (ref >39.00)
LDL Cholesterol: 97 mg/dL (ref 0–99)
NonHDL: 117.4
Total CHOL/HDL Ratio: 3
Triglycerides: 104 mg/dL (ref 0.0–149.0)
VLDL: 20.8 mg/dL (ref 0.0–40.0)

## 2018-10-11 LAB — ALT: ALT: 15 U/L (ref 0–35)

## 2018-10-11 LAB — AST: AST: 19 U/L (ref 0–37)

## 2018-10-27 ENCOUNTER — Ambulatory Visit (INDEPENDENT_AMBULATORY_CARE_PROVIDER_SITE_OTHER): Payer: BC Managed Care – PPO | Admitting: Family

## 2018-10-27 DIAGNOSIS — B349 Viral infection, unspecified: Secondary | ICD-10-CM | POA: Diagnosis not present

## 2018-10-27 NOTE — Progress Notes (Signed)
Virtual Visit via Video Note  I connected with Abigail [name] on 10/27/18 at  3:40 PM EDT by a video enabled telemedicine application and verified that I am speaking with the correct person using two identifiers.  Location: Patient: home Provider: work   I discussed the limitations of evaluation and management by telemedicine and the availability of in person appointments. The patient expressed understanding and agreed to proceed.  History of Present Illness:  Patient is a 67 yr old female who presents today with c/o several symptoms. She reports that symptoms began last night with headache.  Has weakness, dizziness. This morning she had some shortness of breath.  She denies fever. Temp this AM 97.2. 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 She reports sore throat.  + cough this AM.  Reports some myalgias. Had some ? Chills this AM.    Was exposed to a sick child last week at school who was sent home with fever. She is has not been notified if the child was diagnosed with COVID-19 but the child has not returned to school.  Past Medical History:  Diagnosis Date  . Asthma   . Chiari malformation type I (HCC)   . Diabetes mellitus   . GERD (gastroesophageal reflux disease)   . Hx of gout   . Hyperlipidemia   . Hyperparathyroidism (HCC)   . Hypertension   . Lumbar radiculopathy   . Postablative hypothyroidism   . Sickle cell anemia (HCC)   . Spondylolysis   . Thyroid disease      Social History   Socioeconomic History  . Marital status: Married    Spouse name: Not on file  . Number of children: 1  . Years of education: Not on file  . Highest education level: Not on file  Occupational History  . Occupation: Runner, broadcasting/film/video , high school  Social Needs  . Financial resource strain: Not on file  . Food insecurity    Worry: Not on file    Inability: Not on file  . Transportation needs    Medical: Not on file    Non-medical: Not on file  Tobacco Use   . Smoking status: Never Smoker  . Smokeless tobacco: Never Used  Substance and Sexual Activity  . Alcohol use: No  . Drug use: No  . Sexual activity: Not Currently    Partners: Male    Comment: 1st intercourse- 53, married- 46 yrs   Lifestyle  . Physical activity    Days per week: Not on file    Minutes per session: Not on file  . Stress: Not on file  Relationships  . Social Musician on phone: Not on file    Gets together: Not on file    Attends religious service: Not on file    Active member of club or organization: Not on file    Attends meetings of clubs or organizations: Not on file    Relationship status: Not on file  . Intimate partner violence    Fear of current or ex partner: Not on file    Emotionally abused: Not on file    Physically abused: Not on file    Forced sexual activity: Not on file  Other Topics Concern  . Not on file  Social History Narrative   Original from [place]   Married x 44 years   1 daughter in [place]    Past Surgical History:  Procedure Laterality Date  .  ABDOMINAL HYSTERECTOMY  1996   NO oophorectomy  . BREAST BIOPSY Left 2015   [facility]   . CERVICAL LAMINECTOMY     at C1 w/ duraplasty  . CRANIECTOMY SUBOCCIPITAL W/ CERVICAL LAMINECTOMY / CHIARI  07/05/2011   At C1, performed at [facility]  . PARATHYROIDECTOMY  2015   [facility]     Family History  Problem Relation Age of Onset  . Hypertension Sister        sister, daughter , mother   . Hyperlipidemia Sister   . Thyroid disease Sister   . Diabetes Daughter   . Stroke Mother   . Colon cancer Neg Hx   . Sudden death Neg Hx   . Heart attack Neg Hx   . Breast cancer Neg Hx   . Stomach cancer Neg Hx     Allergies  Allergen Reactions  . Pollen Extract Itching  . Citrus Itching, Rash and Swelling  . Ibuprofen Swelling and Rash    Pt now states that she can not take ibuprofen  . Other Itching, Rash and Swelling    seafood. seafood    Current Outpatient Medications on File  Prior to Visit  Medication Sig Dispense Refill  . acetaminophen (TYLENOL) 500 MG tablet Take 1,000 mg by mouth every 6 (six) hours as needed.    Marland Kitchen atorvastatin (LIPITOR) 10 MG tablet Take 1 tablet (10 mg total) by mouth at bedtime. 30 tablet 3  . glucose blood (ONE TOUCH ULTRA TEST) test strip Reported on 07/09/2015    . metFORMIN (GLUCOPHAGE-XR) 500 MG 24 hr tablet Take 1 tablet (500 mg total) by mouth 3 (three) times daily with meals. 270 tablet 3  . OVER THE COUNTER MEDICATION Health joint    . Potassium 99 MG TABS Take by mouth.     No current facility-administered medications on file prior to visit.     There were no vitals taken for this visit.     Observations/Objective:   Gen: Awake, alert, no acute distress Resp: Breathing is even and non-labored Psych: calm/pleasant demeanor Neuro: Alert and Oriented x 3, + facial symmetry, speech is clear.   Assessment and Plan:  Viral illness- concerning for COVID-19. I have advised the pt to self quarantine and remain out of work pending further instructions. She is advised to go to our [facility] testing site on Monday AM for covid-19 testing. We discussed the signs/symptoms that should prompt ED visit such as SOB, severe weakness.  Pt verbalizes understanding. A letter was written for her employer.   Follow Up Instructions:    I discussed the assessment and treatment plan with the patient. The patient was provided an opportunity to ask questions and all were answered. The patient agreed with the plan and demonstrated an understanding of the instructions.   The patient was advised to call back or seek an in-person evaluation if the symptoms worsen or if the condition fails to improve as anticipated.  Nance Pear, NP

## 2018-10-30 ENCOUNTER — Other Ambulatory Visit: Payer: Self-pay | Admitting: Registered"

## 2018-10-30 DIAGNOSIS — Z20822 Contact with and (suspected) exposure to covid-19: Secondary | ICD-10-CM

## 2018-11-01 ENCOUNTER — Ambulatory Visit (INDEPENDENT_AMBULATORY_CARE_PROVIDER_SITE_OTHER): Payer: BC Managed Care – PPO | Admitting: Internal Medicine

## 2018-11-01 ENCOUNTER — Telehealth: Payer: Self-pay | Admitting: Family

## 2018-11-01 ENCOUNTER — Other Ambulatory Visit: Payer: Self-pay

## 2018-11-01 ENCOUNTER — Ambulatory Visit (HOSPITAL_BASED_OUTPATIENT_CLINIC_OR_DEPARTMENT_OTHER)
Admission: RE | Admit: 2018-11-01 | Discharge: 2018-11-01 | Disposition: A | Discharge status: Home / Self Care | Payer: BC Managed Care – PPO | Source: Ambulatory Visit | Attending: Internal Medicine | Admitting: Internal Medicine

## 2018-11-01 ENCOUNTER — Encounter: Payer: Self-pay | Admitting: Internal Medicine

## 2018-11-01 DIAGNOSIS — B349 Viral infection, unspecified: Secondary | ICD-10-CM

## 2018-11-01 LAB — NOVEL CORONAVIRUS, NAA: SARS-CoV-2, NAA: NOT DETECTED

## 2018-11-01 NOTE — Telephone Encounter (Signed)
Please contact pt and ask her how she is feeling?  Her COVID-19 testing is negative. If she is feeling better I think she can return to work tomorrow.  I can post a letter to her mychart account.
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
## 2018-11-01 NOTE — Telephone Encounter (Signed)
Patient was evaluated by Dr. Larose Kells this morning

## 2018-11-02 ENCOUNTER — Encounter: Payer: Self-pay | Admitting: Internal Medicine

## 2018-11-02 NOTE — Assessment & Plan Note (Signed)
Viral syndrome Sxs c/w viral syndrome.  Previously she had a flu shot, 4 days after the onset of symptoms a COVID test was negative. She reports today + chest pain/SOB, we  talk about ER evaluation, but she felt that that was not necessary, "sx are not that bad", she does not look in distress. Advised patient that she could have a virus, or a reaction to recent flu shot or actually COVID-19 (false negative testing). Plan: Chest x-ray, fluids, OTCs for symptom management. I strongly recommend close observation at home, if she is not gradually better she will let me know.  If increased chest pain, difficulty breathing: Go to the ER Needs a letter for work.  Will do.

## 2018-11-07 ENCOUNTER — Other Ambulatory Visit: Payer: Self-pay

## 2018-11-07 ENCOUNTER — Encounter: Payer: Self-pay | Admitting: Obstetrics & Gynecology

## 2018-11-07 ENCOUNTER — Ambulatory Visit: Payer: BC Managed Care – PPO | Admitting: Obstetrics & Gynecology

## 2018-11-07 VITALS — BP 126/84 | Ht 66.0 in | Wt 180.0 lb

## 2018-11-07 DIAGNOSIS — E663 Overweight: Secondary | ICD-10-CM | POA: Diagnosis not present

## 2018-11-07 DIAGNOSIS — Z780 Asymptomatic menopausal state: Secondary | ICD-10-CM

## 2018-11-07 DIAGNOSIS — Z90710 Acquired absence of both cervix and uterus: Secondary | ICD-10-CM | POA: Diagnosis not present

## 2018-11-07 DIAGNOSIS — Z01419 Encounter for gynecological examination (general) (routine) without abnormal findings: Secondary | ICD-10-CM | POA: Diagnosis not present

## 2018-11-07 NOTE — Progress Notes (Signed)
Abigail [name] March 16, 1950 [id number]   History:    68 y.o. G1P1L1 Husband with Alzheimer living in a [place].  RP:  Established patient presenting for annual gyn exam   HPI: S/P TAH.  Menopause, well on no hormone replacement therapy.  No pelvic pain.  No vaginal discharge.  Abstinent.  Urine and bowel movements normal.  Breasts normal.  Body mass index 29.05.  Health labs with family physician.  Past medical history,surgical history, family history and social history were all reviewed and documented in the EPIC chart.  Gynecologic History No LMP recorded. Patient has had a hysterectomy. Contraception: status post hysterectomy Last Pap: 08/2016. Results were: Negative Last mammogram: 10/2017. Results were: Negative Bone Density: 04/2015 Normal Colonoscopy: 2014  Obstetric History OB History  Gravida Para Term Preterm AB Living  1 1       1   SAB TAB Ectopic Multiple Live Births          1    # Outcome Date GA Lbr Len/2nd Weight Sex Delivery Anes PTL Lv  1 Para     F Vag-Spont        ROS: A ROS was performed and pertinent positives and negatives are included in the history.  GENERAL: No fevers or chills. HEENT: No change in vision, no earache, sore throat or sinus congestion. NECK: No pain or stiffness. CARDIOVASCULAR: No chest pain or pressure. No palpitations. PULMONARY: No shortness of breath, cough or wheeze. GASTROINTESTINAL: No abdominal pain, nausea, vomiting or diarrhea, melena or bright red blood per rectum. GENITOURINARY: No urinary frequency, urgency, hesitancy or dysuria. MUSCULOSKELETAL: No joint or muscle pain, no back pain, no recent trauma. DERMATOLOGIC: No rash, no itching, no lesions. ENDOCRINE: No polyuria, polydipsia, no heat or cold intolerance. No recent change in weight. HEMATOLOGICAL: No anemia or easy bruising or bleeding. NEUROLOGIC: No headache, seizures, numbness, tingling or weakness. PSYCHIATRIC: No depression, no loss of interest in normal  activity or change in sleep pattern.     Exam:   BP 126/84    Ht 5\' 6"  (1.676 m)    Wt 180 lb (81.6 kg)    BMI 29.05 kg/m   Body mass index is 29.05 kg/m.  General appearance : Well developed well nourished female. No acute distress HEENT: Eyes: no retinal hemorrhage or exudates,  Neck supple, trachea midline, no carotid bruits, no thyroidmegaly Lungs: Clear to auscultation, no rhonchi or wheezes, or rib retractions  Heart: Regular rate and rhythm, no murmurs or gallops Breast:Examined in sitting and supine position were symmetrical in appearance, no palpable masses or tenderness,  no skin retraction, no nipple inversion, no nipple discharge, no skin discoloration, no axillary or supraclavicular lymphadenopathy Abdomen: no palpable masses or tenderness, no rebound or guarding Extremities: no edema or skin discoloration or tenderness  Pelvic: Vulva: Normal             Vagina: No gross lesions or discharge  Cervix/Uterus absent  Adnexa  Without masses or tenderness  Anus: Normal   Assessment/Plan:  68 y.o. female for annual exam   1. Well female exam with routine gynecological exam Gynecologic exam status post TAH and menopause.  Pap test August 2018 was negative, no indication to repeat this year.  Breast exam normal, last screening mammogram October 2019 was negative, patient will schedule a screening mammogram now.  Colonoscopy in 2014.  Health labs with family physician.  2. S/P TAH (total abdominal hysterectomy)  3. Postmenopausal Well on no hormone replacement therapy.  Last bone density April 2017 was normal.  Vitamin D supplements, calcium intake of 1200 mg daily and regular weightbearing physical activity is recommended.  Repeat bone density at 5 years.  4. Overweight (BMI 25.0-29.9) Recommend a lower calorie/carb diet such as [employer].  Aerobic physical activities 5 times a week and weightlifting every 2 days.  Genia Del MD, 4:14 PM 11/07/2018

## 2018-11-15 ENCOUNTER — Encounter: Payer: Self-pay | Admitting: Obstetrics & Gynecology

## 2018-11-15 NOTE — Patient Instructions (Signed)
1. Well female exam with routine gynecological exam Gynecologic exam status post TAH and menopause.  Pap test August 2018 was negative, no indication to repeat this year.  Breast exam normal, last screening mammogram October 2019 was negative, patient will schedule a screening mammogram now.  Colonoscopy in 2014.  Health labs with family physician.  2. S/P TAH (total abdominal hysterectomy)  3. Postmenopausal Well on no hormone replacement therapy.  Last bone density April 2017 was normal.  Vitamin D supplements, calcium intake of 1200 mg daily and regular weightbearing physical activity is recommended.  Repeat bone density at 5 years.  4. Overweight (BMI 25.0-29.9) Recommend a lower calorie/carb diet such as [employer].  Aerobic physical activities 5 times a week and weightlifting every 2 days.  Thomos Lemons, it was a pleasure seeing you today!

## 2018-11-16 ENCOUNTER — Ambulatory Visit: Payer: BC Managed Care – PPO | Admitting: Internal Medicine

## 2018-12-22 ENCOUNTER — Other Ambulatory Visit: Payer: Self-pay | Admitting: Internal Medicine

## 2018-12-29 ENCOUNTER — Other Ambulatory Visit: Payer: Self-pay

## 2018-12-29 DIAGNOSIS — Z20822 Contact with and (suspected) exposure to covid-19: Secondary | ICD-10-CM

## 2019-01-01 LAB — NOVEL CORONAVIRUS, NAA: SARS-CoV-2, NAA: NOT DETECTED

## 2019-01-04 ENCOUNTER — Encounter (INDEPENDENT_AMBULATORY_CARE_PROVIDER_SITE_OTHER): Payer: BC Managed Care – PPO | Admitting: Ophthalmology

## 2019-01-08 ENCOUNTER — Encounter (HOSPITAL_COMMUNITY): Payer: Self-pay

## 2019-01-08 ENCOUNTER — Ambulatory Visit (INDEPENDENT_AMBULATORY_CARE_PROVIDER_SITE_OTHER): Payer: BC Managed Care – PPO | Admitting: Internal Medicine

## 2019-01-08 ENCOUNTER — Other Ambulatory Visit: Payer: Self-pay

## 2019-01-08 ENCOUNTER — Emergency Department (HOSPITAL_COMMUNITY)
Admission: EM | Admit: 2019-01-08 | Discharge: 2019-01-08 | Disposition: A | Discharge status: Home / Self Care | Payer: BC Managed Care – PPO | Attending: Emergency Medicine | Admitting: Emergency Medicine

## 2019-01-08 ENCOUNTER — Emergency Department (HOSPITAL_COMMUNITY): Payer: BC Managed Care – PPO

## 2019-01-08 DIAGNOSIS — I10 Essential (primary) hypertension: Secondary | ICD-10-CM | POA: Insufficient documentation

## 2019-01-08 DIAGNOSIS — R200 Anesthesia of skin: Secondary | ICD-10-CM | POA: Diagnosis present

## 2019-01-08 DIAGNOSIS — Z79899 Other long term (current) drug therapy: Secondary | ICD-10-CM | POA: Insufficient documentation

## 2019-01-08 DIAGNOSIS — M542 Cervicalgia: Secondary | ICD-10-CM

## 2019-01-08 DIAGNOSIS — J45909 Unspecified asthma, uncomplicated: Secondary | ICD-10-CM | POA: Diagnosis not present

## 2019-01-08 DIAGNOSIS — G935 Compression of brain: Secondary | ICD-10-CM | POA: Diagnosis not present

## 2019-01-08 DIAGNOSIS — R202 Paresthesia of skin: Secondary | ICD-10-CM

## 2019-01-08 DIAGNOSIS — R519 Headache, unspecified: Secondary | ICD-10-CM | POA: Diagnosis not present

## 2019-01-08 DIAGNOSIS — M79601 Pain in right arm: Secondary | ICD-10-CM

## 2019-01-08 DIAGNOSIS — E119 Type 2 diabetes mellitus without complications: Secondary | ICD-10-CM | POA: Insufficient documentation

## 2019-01-08 DIAGNOSIS — R531 Weakness: Secondary | ICD-10-CM | POA: Insufficient documentation

## 2019-01-08 LAB — CBC
HCT: 39.5 % (ref 36.0–46.0)
Hemoglobin: 12.9 g/dL (ref 12.0–15.0)
MCH: 28.8 pg (ref 26.0–34.0)
MCHC: 32.7 g/dL (ref 30.0–36.0)
MCV: 88.2 fL (ref 80.0–100.0)
Platelets: 310 10*3/uL (ref 150–400)
RBC: 4.48 MIL/uL (ref 3.87–5.11)
RDW: 13.2 % (ref 11.5–15.5)
WBC: 5.4 10*3/uL (ref 4.0–10.5)
nRBC: 0 % (ref 0.0–0.2)

## 2019-01-08 LAB — DIFFERENTIAL
Abs Immature Granulocytes: 0.01 10*3/uL (ref 0.00–0.07)
Basophils Absolute: 0 10*3/uL (ref 0.0–0.1)
Basophils Relative: 1 %
Eosinophils Absolute: 0.2 10*3/uL (ref 0.0–0.5)
Eosinophils Relative: 4 %
Immature Granulocytes: 0 %
Lymphocytes Relative: 31 %
Lymphs Abs: 1.7 10*3/uL (ref 0.7–4.0)
Monocytes Absolute: 0.4 10*3/uL (ref 0.1–1.0)
Monocytes Relative: 7 %
Neutro Abs: 3.1 10*3/uL (ref 1.7–7.7)
Neutrophils Relative %: 57 %

## 2019-01-08 LAB — COMPREHENSIVE METABOLIC PANEL
ALT: 22 U/L (ref 0–44)
AST: 23 U/L (ref 15–41)
Albumin: 4.2 g/dL (ref 3.5–5.0)
Alkaline Phosphatase: 92 U/L (ref 38–126)
Anion gap: 11 (ref 5–15)
BUN: 8 mg/dL (ref 8–23)
CO2: 27 mmol/L (ref 22–32)
Calcium: 8.7 mg/dL — ABNORMAL LOW (ref 8.9–10.3)
Chloride: 102 mmol/L (ref 98–111)
Creatinine, Ser: 0.86 mg/dL (ref 0.44–1.00)
GFR calc Af Amer: >60 mL/min (ref >60)
GFR calc non Af Amer: >60 mL/min (ref >60)
Glucose, Bld: 128 mg/dL — ABNORMAL HIGH (ref 70–99)
Potassium: 4.5 mmol/L (ref 3.5–5.1)
Sodium: 140 mmol/L (ref 135–145)
Total Bilirubin: 0.5 mg/dL (ref 0.3–1.2)
Total Protein: 7.2 g/dL (ref 6.5–8.1)

## 2019-01-08 LAB — PROTIME-INR
INR: 0.9 (ref 0.8–1.2)
Prothrombin Time: 12.1 seconds (ref 11.4–15.2)

## 2019-01-08 LAB — APTT: aPTT: 33 seconds (ref 24–36)

## 2019-01-08 MED ORDER — SODIUM CHLORIDE 0.9% FLUSH: 3.0000 mL | Freq: Once | INTRAVENOUS | Status: DC

## 2019-01-08 MED ORDER — GADOBUTROL 1 MMOL/ML IV SOLN
8.0000 mL | Freq: Once | INTRAVENOUS | Status: AC | PRN
  Administered 2019-01-08: 16:00:00 8 mL via INTRAVENOUS

## 2019-01-08 MED ORDER — GABAPENTIN 100 MG PO CAPS: 100.0000 mg | ORAL_CAPSULE | Freq: Three times a day (TID) | ORAL | 0 refills | Status: DC

## 2019-01-08 NOTE — ED Notes (Signed)
Patient transported to MRI 
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
## 2019-01-08 NOTE — ED Provider Notes (Signed)
[facility] EMERGENCY DEPARTMENT Provider Note   CSN: [id number] Arrival date & time: 01/08/19  1120     History Chief Complaint  Patient presents with  . Numbness    Abigail [name] is a 68 y.o. female.  HPI Patient presents with right-sided pain and some numbness.  History of surgery for Chiari malformation at [facility] in 2013.  Over the last year however she has been having more pain in her neck and bottom of her head and more weakness going down her right side with pain.  Has been seen by neurology, Dr. Vickey Huger.  MRI had been ordered around 6 months ago but not done because it was an expensive test.  Pain is gotten worse.  Saw PCP today and sent in here for further evaluation worried about myelopathy.  Has had headaches in the bottom of her head.  States this is just like she was having before the surgery.  No fevers.  No trauma.  States it hurts to walk around.    Past Medical History:  Diagnosis Date  . Asthma   . Chiari malformation type I (HCC)   . Diabetes mellitus   . GERD (gastroesophageal reflux disease)   . Hx of gout   . Hyperlipidemia   . Hyperparathyroidism (HCC)   . Hypertension   . Lumbar radiculopathy   . Postablative hypothyroidism   . Sickle cell anemia (HCC)   . Spondylolysis   . Thyroid disease     Patient Active Problem List   Diagnosis Date Noted  . 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 Somatic complaints, multiple 08/09/2017  . Annual physical exam 08/27/2015  . Controlled type 2 diabetes mellitus without complication, without long-term current use of insulin (HCC) 05/06/2015  . H/o Primary hyperparathyroidism (HCC) 05/06/2015  . PCP NOTES >>>>> 01/23/2015  . Chiari malformation type I (HCC)   . Sickle cell anemia (HCC)   . Lumbar radiculopathy   . Spondylolysis   . Hx of gout   . Thyroid disorder 01/02/2015  . Allergic rhinitis 01/02/2015    Past Surgical History:  Procedure Laterality Date  . ABDOMINAL  HYSTERECTOMY  1996   NO oophorectomy  . BREAST BIOPSY Left 2015   [facility]   . CERVICAL LAMINECTOMY     at C1 w/ duraplasty  . CRANIECTOMY SUBOCCIPITAL W/ CERVICAL LAMINECTOMY / CHIARI  07/05/2011   At C1, performed at [facility]  . PARATHYROIDECTOMY  2015   [facility]      OB History    Gravida  1   Para  1   Term      Preterm      AB      Living  1     SAB      TAB      Ectopic      Multiple      Live Births  1           Family History  Problem Relation Age of Onset  . Hypertension Sister        sister, daughter , mother   . Hyperlipidemia Sister   . Thyroid disease Sister   . Diabetes Daughter   . Stroke Mother   . Colon cancer Neg Hx   . Sudden death Neg Hx   . Heart attack Neg Hx   . Breast cancer Neg Hx   . Stomach cancer Neg Hx     Social History   Tobacco Use  . Smoking status: Never Smoker  .  Smokeless tobacco: Never Used  Substance Use Topics  . Alcohol use: No  . Drug use: No    Home Medications Prior to Admission medications   Medication Sig Start Date End Date Taking? Authorizing Provider  acetaminophen (TYLENOL) 500 MG tablet Take 1,000 mg by mouth every 6 (six) hours as needed.    [provider]  atorvastatin (LIPITOR) 10 MG tablet Take 1 tablet (10 mg total) by mouth at bedtime. 09/01/18   Colon Branch, MD  glucose blood (ONE TOUCH ULTRA TEST) test strip Reported on 07/09/2015 06/06/11   [provider]  metFORMIN (GLUCOPHAGE-XR) 500 MG 24 hr tablet TAKE 1 TABLET BY MOUTH THREE TIMES DAILY WITH MEALS 12/25/18   Philemon Kingdom, MD  [place] joint    [provider]  Potassium 99 MG TABS Take by mouth.    [provider]    Allergies    Pollen extract, Citrus, Ibuprofen, and Other  Review of Systems   Review of Systems  Constitutional: Negative for appetite change.  HENT: Negative for congestion.   Cardiovascular: Negative for chest pain.  Genitourinary: Negative for  flank pain.  Musculoskeletal: Negative for back pain.  Skin: Negative for rash.  Neurological: Positive for weakness, numbness and headaches.  Psychiatric/Behavioral: Negative for confusion.    Physical Exam Updated Vital Signs BP (!) 132/58 (BP Location: Left Arm)   Pulse 70   Temp 98.9 F (37.2 C)   Resp 18   Ht 5\' 7"  (1.702 m)   Wt 81.6 kg   SpO2 100%   BMI 28.19 kg/m   Physical Exam Vitals and nursing note reviewed.  HENT:     Head: Atraumatic.     Comments: Tenderness on base of skull.  Scar from previous surgery. Neck:     Comments: Tenderness superior cervical spine.  Good range of motion.  Patient is wearing a soft collar.  Removed for examination. Pulmonary:     Effort: Pulmonary effort is normal.  Abdominal:     Tenderness: There is no abdominal tenderness.  Musculoskeletal:     Cervical back: Neck supple.     Left lower leg: No edema.  Skin:    Coloration: Skin is not jaundiced.  Neurological:     Mental Status: She is alert.     Comments: Face symmetric.  Able ambulate with somewhat slow gait.  Soft collar in place.  Good grip strength bilaterally.  Good flexion extension at elbows wrists and hand.  Sensation intact over hands bilaterally.  Appears to be good strength in legs.     ED Results / Procedures / Treatments   Labs (all labs ordered are listed, but only abnormal results are displayed) Labs Reviewed  COMPREHENSIVE METABOLIC PANEL - Abnormal; Notable for the following components:      Result Value   Glucose, Bld 128 (*)    Calcium 8.7 (*)    All other components within normal limits  PROTIME-INR  APTT  CBC  DIFFERENTIAL    EKG None  Radiology No results found.  Procedures Procedures (including critical care time)  Medications Ordered in ED Medications  sodium chloride flush (NS) 0.9 % injection 3 mL (has no administration in time range)    ED Course  I have reviewed the triage vital signs and the nursing notes.  Pertinent  labs & imaging results that were available during my care of the patient were reviewed by me and considered in my medical decision making (see chart  for details).    MDM Rules/Calculators/A&P                      Patient with neck pain post remote Chiari malformation surgery.  Also headache.  Pain down the right side of body.  Will get MRI of head and cervical spine.  Care will be turned over to oncoming provider. Final Clinical Impression(s) / ED Diagnoses Final diagnoses:  None    Rx / DC Orders ED Discharge Orders    None       Benjiman CorePickering, Axie Hayne, MD 01/08/19 1436

## 2019-01-08 NOTE — Assessment & Plan Note (Signed)
Neck pain, right-sided paresthesias: The patient has a history of craniectomy suboccipital with cervical laminectomy due to Chiari. Now presents with acute on chronic neck pain, with right-sided paresthesias and weakness associated with gait difficulties . I suspect major problem, possibly a myelopathy. Recommend a ER visit to [facility] or [facility]. In no uncertain terms I told her that this is potentially very serious. She states she will get somebody to drive her and go this morning. Needs a note for her job: provided

## 2019-01-08 NOTE — Discharge Instructions (Addendum)
Recommend following up both with your primary doctor, your neurologist as well as your surgeon at [facility].  Please call your primary doctor or neurologist tomorrow morning to be evaluated later this week for close recheck.  Recommend trying the prescribed gabapentin, note this can make you drowsy and should not be taken while driving or operating heavy machinery.  Recommend discussing ongoing medication management with your regular doctors.  If you develop weakness, vision changes, or other new concerning symptom, return to ER for reassessment.

## 2019-01-08 NOTE — ED Triage Notes (Signed)
Pt reports 1 week of right sided numbness and pain (from her head down to her toes). Pt has hx of neck surgery in 2013, pt wearing soft collar at this time to help with pain. Pt hypertensive in triage 190s. Pt has virtual visit with PCP this morning and advised pt to come here. No weakness or other neuro deficits noted in triage, pt a.o

## 2019-01-08 NOTE — ED Provider Notes (Signed)
Signout note  Summary: 68 year old lady presenting to ER with chief complaint of right-sided pain, numbness.  Prior history Chiari malformation, MRI brain, C-spine ordered to further evaluate.  Saw PCP, concern for myelopathy.   3:37 PM Received signout from Dr. Alvino Chapel, disposition and plan pending MRI results  5:09 PM recheck patient, reviewed results in detail, MRI brain negative for stroke, MRI C-spine negative for any acute changes, radiologist noted  "1. Unchanged appearance of the brain status post suboccipital decompression for Chiari I malformation. No acute intracranial abnormality. 2. Stable to slight progression of cervical disc and facet degeneration resulting in up to mild spinal stenosis and moderate neural foraminal stenosis as above."  Patient has no focal deficits on exam, given above work-up, believe patient is stable for discharge and close outpatient follow-up with her primary doctor, her neurologist as well as her surgeon.  Will give trial of gabapentin for pain.     Lucrezia Starch, MD 01/08/19 [phone]

## 2019-01-09 ENCOUNTER — Ambulatory Visit (INDEPENDENT_AMBULATORY_CARE_PROVIDER_SITE_OTHER): Payer: BC Managed Care – PPO | Admitting: Internal Medicine

## 2019-01-09 DIAGNOSIS — M542 Cervicalgia: Secondary | ICD-10-CM | POA: Diagnosis not present
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
## 2019-01-10 ENCOUNTER — Other Ambulatory Visit: Payer: Self-pay

## 2019-01-10 ENCOUNTER — Telehealth: Payer: Self-pay

## 2019-01-10 DIAGNOSIS — M542 Cervicalgia: Secondary | ICD-10-CM

## 2019-01-10 DIAGNOSIS — G935 Compression of brain: Secondary | ICD-10-CM

## 2019-01-10 DIAGNOSIS — R519 Headache, unspecified: Secondary | ICD-10-CM

## 2019-01-10 NOTE — Telephone Encounter (Signed)
Pt says referral went to wrong place and it is to go to [facility]  to a Dr Jaynee Eagles. Needs to be done ASAP

## 2019-01-10 NOTE — Assessment & Plan Note (Signed)
Neck pain, right-sided paresthesias: Work-up at the ER fortunately was benign, MRI of the neck and brain abnormal but unchanged from previous. The patient reports today that she has a tremendous amount of pain but it is temporarily helped by gabapentin. We talk about possibly increasing gabapentin dose but she declined because make her sleepy Prednisone?  Declines because her sugar quickly increases with certain diets and medications. We agreed then to continue gabapentin, start Tylenol, refer back to neurology. According to the last note from Dr. Maureen Chatters, she rec to see Dr. Jaynee Eagles Patient to let me know if sxs change or get worse

## 2019-01-10 NOTE — Telephone Encounter (Signed)
-----   Message from Colon Branch, MD sent at 01/09/2019  4:38 PM EST ----- Regarding: Please enter a neurology referral, DX Chiari malformation, neck pain, dry Dr. Jaynee Eagles

## 2019-01-10 NOTE — Telephone Encounter (Signed)
CORRECT order placed.

## 2019-01-10 NOTE — Addendum Note (Signed)
Addended by: Wynonia Musty A on: 01/10/2019 03:56 PM   Modules accepted: Orders

## 2019-01-11 ENCOUNTER — Telehealth: Payer: Self-pay | Admitting: Neurology

## 2019-01-11 NOTE — Telephone Encounter (Signed)
Totally fine thanks 

## 2019-01-11 NOTE — Telephone Encounter (Signed)
We've received an internal referral on patient for Chiari malformation type I. She has seen Dr. Brett Fairy in the past, but both patient and referring provider are requesting that patient see Dr. Jaynee Eagles for this. Would you both be ok with that?

## 2019-01-11 NOTE — Telephone Encounter (Signed)
Thank you, Vivien Rota. I am happy you will see her. She is a delightful lady. CD

## 2019-01-16 ENCOUNTER — Encounter: Payer: Self-pay | Admitting: Internal Medicine

## 2019-01-16 ENCOUNTER — Ambulatory Visit (INDEPENDENT_AMBULATORY_CARE_PROVIDER_SITE_OTHER): Payer: BC Managed Care – PPO | Admitting: Internal Medicine

## 2019-01-16 ENCOUNTER — Other Ambulatory Visit: Payer: Self-pay

## 2019-01-16 VITALS — BP 148/90 | HR 88 | Ht 66.0 in | Wt 190.0 lb

## 2019-01-16 DIAGNOSIS — R202 Paresthesia of skin: Secondary | ICD-10-CM

## 2019-01-16 DIAGNOSIS — E210 Primary hyperparathyroidism: Secondary | ICD-10-CM

## 2019-01-16 DIAGNOSIS — R200 Anesthesia of skin: Secondary | ICD-10-CM

## 2019-01-16 DIAGNOSIS — E559 Vitamin D deficiency, unspecified: Secondary | ICD-10-CM | POA: Diagnosis not present

## 2019-01-16 DIAGNOSIS — E119 Type 2 diabetes mellitus without complications: Secondary | ICD-10-CM | POA: Diagnosis not present

## 2019-01-16 LAB — VITAMIN D 25 HYDROXY (VIT D DEFICIENCY, FRACTURES): VITD: 31.28 ng/mL (ref 30.00–100.00)

## 2019-01-16 LAB — POCT GLYCOSYLATED HEMOGLOBIN (HGB A1C): Hemoglobin A1C: 6.7 % — AB (ref 4.0–5.6)

## 2019-01-16 LAB — PHOSPHORUS: Phosphorus: 4.4 mg/dL (ref 2.3–4.6)

## 2019-01-16 LAB — VITAMIN B12: Vitamin B-12: >1500 pg/mL — ABNORMAL HIGH (ref 211–911)

## 2019-01-16 MED ORDER — ONETOUCH ULTRA 2 W/DEVICE KIT: PACK | 0 refills | Status: DC

## 2019-01-16 MED ORDER — METFORMIN HCL ER 500 MG PO TB24: 500.0000 mg | ORAL_TABLET | Freq: Three times a day (TID) | ORAL | 3 refills | Status: DC

## 2019-01-16 NOTE — Patient Instructions (Addendum)
Please continue Metformin ER 500 mg 3x a day.  Also, continue vitamin D 5000 units daily.  Please stop at the lab.  Please return in 6 months with your sugar log.

## 2019-01-16 NOTE — Progress Notes (Signed)
Patient ID: Abigail [name], female   DOB: 1951-01-24, 68 y.o.   MRN: [id number]  This visit occurred during the SARS-CoV-2 public health emergency.  Safety protocols were in place, including screening questions prior to the visit, additional usage of staff PPE, and extensive cleaning of exam room while observing appropriate contact time as indicated for disinfecting solutions.   HPI: Abigail [name] is a 68 y.o.-year-old female, returning for f/u for DM2, dx in ~2010, non-insulin-dependent, controlled, without complications and h/o primary HPTH.  She is the wife of [name], also my patient. Last visit 6 months ago.  She will in the ED with paresthesia and pain in the right arm on 01/08/2019.  At that time, calcium was slightly low, at 8.7 (8.9-10.3). She was started on Gabapentin for generalized pain. She does not like how she feels on this: weak, gained weight, sugars are higher.  DM2: Reviewed HbA1c levels: Lab Results  Component Value Date   HGBA1C 7.1 (A) 07/10/2018   HGBA1C 6.9 (A) 02/16/2018   HGBA1C 6.6 (A) 08/12/2017   Pt is on a regimen of: - Metformin ER 500 mg 2x >> 3x a day  Pt checks her sugars once a day -per review of her log: - am:   117-146, 154 >> 112-148, 166 >> 88-156, 160 - 2h after b'fast:94-119, 145, 195 >> 106-115 - before lunch: 120, 128, 167, 200 (no metformin) >> 99-119 - 2h after lunch: 140s >> n/c >> 162 >> n/c >> 105, 106 - before dinner:  114, 184 >> n/c >> 94 >> 103 >> 112, 181 (forgot Metformin) - 2h after dinner:  189 (ate late) >> n/c >> 97, 141 >> n/c - bedtime: n/c >> 132 >> n/c - nighttime: n/c Lowest sugar was 81 >> 93 >> 88; she has hypoglycemia awareness in the 80s. Highest sugar was  195 >> 200 >> 181.  Glucometer: ReliOn  Pt's meals are: - Breakfast: Fruit, eggs - Lunch: chicken/fish + veggies - no red meat  - Dinner: veggies + fruit +/- chicken/fish - Snacks: cashews, fruit (Mango)  No CKD, last BUN/creatinine:   Lab Results  Component Value Date   BUN 8 01/08/2019   CREATININE 0.86 01/08/2019   No HL; last set of lipids: Lab Results  Component Value Date   CHOL 183 10/11/2018   HDL 65.60 10/11/2018   LDLCALC 97 10/11/2018   TRIG 104.0 10/11/2018   CHOLHDL 3 10/11/2018  Not on a statin. - last eye exam was 11/2016: No DR -+ Numbness and tingling in her feet.  She has a history of surgery on her toe.  H/o primary hyperparathyroidism  - s/p 2x parathyroid glands resected in 2015, with resolution of her hypercalcemia She occasionally takes 1 Tums when she feels weak  Calcium level was normal with exception of last check: Lab Results  Component Value Date   CALCIUM 8.7 (L) 01/08/2019   CALCIUM 10.3 07/25/2018   CALCIUM 9.2 07/20/2017   CALCIUM 9.1 07/21/2016   CALCIUM 8.8 02/20/2016   CALCIUM 9.1 12/22/2015   CALCIUM 9.2 07/09/2015   CALCIUM 9.2 01/02/2015   CALCIUM 10.4 05/03/2006   She also has a history of vitamin D deficiency: Lab Results  Component Value Date   VD25OH 35.44 02/16/2018   VD25OH 14.17 (L) 08/12/2017   She continues on 5000 units vitamin D daily.  She is on B12 2000 mcg daily.  ROS: Constitutional: + weight gain/+ weight loss, no fatigue, no subjective hyperthermia, no subjective hypothermia  Eyes: no blurry vision, no xerophthalmia ENT: no sore throat, no nodules palpated in neck, no dysphagia, no odynophagia, no hoarseness Cardiovascular: no CP/no SOB/no palpitations/no leg swelling Respiratory: no cough/no SOB/no wheezing Gastrointestinal: no N/no V/no D/no C/no acid reflux Musculoskeletal: no muscle aches/no joint aches Skin: no rashes, no hair loss Neurological: no tremors/no numbness/no tingling/no dizziness  I reviewed pt's medications, allergies, PMH, social hx, family hx, and changes were documented in the history of present illness. Otherwise, unchanged from my initial visit note.   Past Medical History:  Diagnosis Date  . Asthma   .  Chiari malformation type I (HCC)   . Diabetes mellitus   . GERD (gastroesophageal reflux disease)   . Hx of gout   . Hyperlipidemia   . Hyperparathyroidism (HCC)   . Hypertension   . Lumbar radiculopathy   . Postablative hypothyroidism   . Sickle cell anemia (HCC)   . Spondylolysis   . Thyroid disease    Past Surgical History:  Procedure Laterality Date  . ABDOMINAL HYSTERECTOMY  1996   NO oophorectomy  . BREAST BIOPSY Left 2015   [facility]   . CERVICAL LAMINECTOMY     at C1 w/ duraplasty  . CRANIECTOMY SUBOCCIPITAL W/ CERVICAL LAMINECTOMY / CHIARI  07/05/2011   At C1, performed at [facility]  . PARATHYROIDECTOMY  2015   [facility]    Social History   Social History  . Marital Status: Married    Spouse Name: N/A  . Number of Children: 1   Occupational History  . teacher - high school     Social History Main Topics  . Smoking status: Never Smoker   . Smokeless tobacco: Not on file  . Alcohol Use: No  . Drug Use: No   Social History Narrative   Original from [place]   Married x 44 years   Current Outpatient Medications on File Prior to Visit  Medication Sig Dispense Refill  . acetaminophen (TYLENOL) 500 MG tablet Take 1,000 mg by mouth every 6 (six) hours as needed.    Marland Kitchen atorvastatin (LIPITOR) 10 MG tablet Take 1 tablet (10 mg total) by mouth at bedtime. 30 tablet 3  . gabapentin (NEURONTIN) 100 MG capsule Take 1 capsule (100 mg total) by mouth 3 (three) times daily. 90 capsule 0  . glucose blood (ONE TOUCH ULTRA TEST) test strip Reported on 07/09/2015    . metFORMIN (GLUCOPHAGE-XR) 500 MG 24 hr tablet TAKE 1 TABLET BY MOUTH THREE TIMES DAILY WITH MEALS 270 tablet 0  . OVER THE COUNTER MEDICATION Health joint    . Potassium 99 MG TABS Take by mouth.     No current facility-administered medications on file prior to visit.   Allergies  Allergen Reactions  . Pollen Extract Itching  . Citrus Itching, Rash and Swelling  . Ibuprofen Swelling and Rash    Pt now states that she  can not take ibuprofen  . Other Itching, Rash and Swelling    seafood. seafood   Family History  Problem Relation Age of Onset  . Hypertension Sister        sister, daughter , mother   . Hyperlipidemia Sister   . Thyroid disease Sister   . Diabetes Daughter   . Stroke Mother   . Colon cancer Neg Hx   . Sudden death Neg Hx   . Heart attack Neg Hx   . Breast cancer Neg Hx   . Stomach cancer Neg Hx    PE: BP (!) 148/90  Pulse 88   Ht 5\' 6"  (1.676 m)   Wt 190 lb (86.2 kg)   SpO2 98%   BMI 30.67 kg/m  Wt Readings from Last 3 Encounters:  01/16/19 190 lb (86.2 kg)  01/08/19 180 lb (81.6 kg)  11/07/18 180 lb (81.6 kg)   Constitutional: overweight, in NAD Eyes: PERRLA, EOMI, no exophthalmos ENT: moist mucous membranes, no thyromegaly, no cervical lymphadenopathy Cardiovascular: RRR, No MRG Respiratory: CTA B Gastrointestinal: abdomen soft, NT, ND, BS+ Musculoskeletal: no deformities, strength intact in all 4 Skin: moist, warm, no rashes Neurological: no tremor with outstretched hands, DTR normal in all 4  ASSESSMENT: 1. DM2, non-insulin-dependent, now more controlled, without complications  2. History of hyperparathyroidism -  status post 2 gland parathyroidectomy in 2015   3. Vit D def  PLAN:  1. Patient with fairly well-controlled type 2 diabetes, on oral antidiabetic regimen with Metformin ER only.  She tolerates this well, without nausea or diarrhea.   -At last visit, sugars are higher, and she has been less deep and has been eating more during the coronavirus pandemic.  She was also stressed that she could not visit her husband in the assisted living facility -he is also my patient.  Her HbA1c was 6.1%, higher than before, and sugars were also higher.  However, she was planning to change her diet and improve exercise and we did not change her regimen at that time.  She lost 9 pounds since last visit, but regained them after starting gabapentin!  She would like to stop  the medication and now is tapering it down.  I advised her to do so only on the PCPs advice. -At this visit, sugars are mostly at goal, with occasional spikes in the hyperglycemic range.  She feels that her sugars are higher after starting gabapentin and, as mentioned above, she is trying to come off the medication. -No changes are needed in her regimen for now - I suggested to:   Patient Instructions  Please continue Metformin ER 500 mg 3x a day.  Also, continue vitamin D 5000 units daily  Please return in 6 months with your sugar log.   - we checked her HbA1c: 6.7% (improved) - advised to check sugars at different times of the day - 1x a day, rotating check times - advised for yearly eye exams >> she is not UTD - return to clinic in 6 months   2. History of primary hyperparathyroidism -She is status post parathyroidectomy -Most recent calcium level was slightly decreased, at 8.7 (8.9-10.3) on 01/08/2019.  At that time, she was in the ED with paresthesia and pain in right arm. -At today's visit, we discussed that paresthesias can occur due to low calcium I will check the following labs: Orders Placed This Encounter  Procedures  . VITAMIN D 25 Hydroxy (Vit-D Deficiency, Fractures)  . PTH, intact and calcium  . Phosphorus  . Vitamin D 1,25 dihydroxy  . Vitamin B12  . POCT HgB A1C  -She usually takes calcium (Tums) when she feels weak and tired, but I wonder if she should take a tablet daily.  I will let her know after the above results returned.  3. Vit D def -Vitamin D level was low in the past and we started 5000 units vitamin D daily -She continues this dose today -Latest level was normal at the beginning of the year -We will recheck this today  Component     Latest Ref Rng & Units 01/16/2019  Calcium  8.7 - 10.3 mg/dL 8.9  PTH, Intact     15 - 65 pg/mL 26  PTH Interp      Comment  Vitamin B12     211 - 911 pg/mL >1500 (H)  VITD     30.00 - 100.00 ng/mL 31.28   Phosphorus     2.3 - 4.6 mg/dL 4.4   Calcitriol level is pending.  All calcium related labs are normal.  However, I do not feel it would be wrong for her to take a Tums tablet daily.  I will advise her to do so.  Her B12 level is high, so she will most likely need a reduction in dose to 1000 mcg daily.  Carlus Pavlov, MD PhD [facility]

## 2019-01-17 LAB — PTH, INTACT AND CALCIUM
Calcium: 8.9 mg/dL (ref 8.7–10.3)
PTH: 26 pg/mL (ref 15–65)

## 2019-01-19 LAB — VITAMIN D 1,25 DIHYDROXY
Vitamin D 1, 25 (OH)2 Total: 47 pg/mL (ref 18–72)
Vitamin D2 1, 25 (OH)2: <8 pg/mL
Vitamin D3 1, 25 (OH)2: 47 pg/mL

## 2019-01-31 ENCOUNTER — Encounter (INDEPENDENT_AMBULATORY_CARE_PROVIDER_SITE_OTHER): Payer: BC Managed Care – PPO | Admitting: Ophthalmology

## 2019-02-12 ENCOUNTER — Encounter (INDEPENDENT_AMBULATORY_CARE_PROVIDER_SITE_OTHER): Payer: BC Managed Care – PPO | Admitting: Ophthalmology

## 2019-02-12 DIAGNOSIS — H353112 Nonexudative age-related macular degeneration, right eye, intermediate dry stage: Secondary | ICD-10-CM

## 2019-02-12 DIAGNOSIS — E113393 Type 2 diabetes mellitus with moderate nonproliferative diabetic retinopathy without macular edema, bilateral: Secondary | ICD-10-CM | POA: Diagnosis not present

## 2019-02-12 DIAGNOSIS — E11319 Type 2 diabetes mellitus with unspecified diabetic retinopathy without macular edema: Secondary | ICD-10-CM | POA: Diagnosis not present

## 2019-02-12 DIAGNOSIS — H43813 Vitreous degeneration, bilateral: Secondary | ICD-10-CM

## 2019-02-21 ENCOUNTER — Encounter: Payer: Self-pay | Admitting: Internal Medicine

## 2019-02-21 ENCOUNTER — Ambulatory Visit (INDEPENDENT_AMBULATORY_CARE_PROVIDER_SITE_OTHER): Payer: BC Managed Care – PPO | Admitting: Internal Medicine

## 2019-02-21 DIAGNOSIS — R079 Chest pain, unspecified: Secondary | ICD-10-CM

## 2019-02-21 DIAGNOSIS — R0609 Other forms of dyspnea: Secondary | ICD-10-CM

## 2019-02-21 DIAGNOSIS — R0600 Dyspnea, unspecified: Secondary | ICD-10-CM | POA: Diagnosis not present

## 2019-02-21 DIAGNOSIS — Z09 Encounter for follow-up examination after completed treatment for conditions other than malignant neoplasm: Secondary | ICD-10-CM

## 2019-02-21 DIAGNOSIS — E119 Type 2 diabetes mellitus without complications: Secondary | ICD-10-CM | POA: Diagnosis not present

## 2019-02-21 MED ORDER — NITROGLYCERIN 0.4 MG SL SUBL: 0.4000 mg | SUBLINGUAL_TABLET | SUBLINGUAL | 0 refills | Status: DC | PRN
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
## 2019-02-22 NOTE — Assessment & Plan Note (Addendum)
  DOE, chest pain, DM: This is a virtual video visit, she reports at least 2 weeks of DOE/chest pain.  She is chest pain-free currently.  She takes an aspirin, her last A1c few weeks ago was 6.7. Her symptoms need to be investigated further.   Clinically no evidence of Covid or pneumonia. EKG 01/09/2019 at the ER: NSR. Ambulatory BPs at home in the 120s.  She has a pulse oximeter, O2 sat 98%. Plan: Aspirin 81 mg (history of a reaction to ibuprofen x1 but takes aspirin without any problems) Cardiology referral ASAP Rest at home, continue aspirin, nitroglycerin as needed.  Rx sent Explained how to use nitroglycerin also to call 911 if she has severe chest pain. Neck pain, right-sided paresthesias: To see neurology 03/07/2019.

## 2019-02-24 NOTE — Progress Notes (Signed)
Cardiology Office Note:    Date:  02/26/2019   ID:  Abigail [name], DOB Jun 20, 1950, MRN [id number]  PCP:  Colon Branch, MD  Cardiologist:  No primary care provider on file.  Electrophysiologist:  None   Referring MD: Colon Branch, MD   Chief Complaint  Patient presents with  . Chest Pain    History of Present Illness:    Abigail [name] is a 69 y.o. 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 Larose Kells for evaluation of chest pain.   She reports that she has been having chest pain and dyspnea on exertion over the last 2 to 3 weeks.  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  When she does these activities she notices she gets short of breath easily.  Also has begun having chest pain.  She describes chest pain as dull aching pain in center of her chest.  Resolves with rest after a few minutes.  She has noted a few episodes of chest pain at rest.  Never smoked.  No family history heart disease.  Also reports palpitations.  Feels like heart is racing, lasts about 10 to 15 minutes.  Occurs about once per day.   Past Medical History:  Diagnosis Date  . Asthma   . Chiari malformation type I ([place])   . Diabetes mellitus   . GERD (gastroesophageal reflux disease)   . Hx of gout   . Hyperlipidemia   . Hyperparathyroidism ([place])   . Hypertension   . Lumbar radiculopathy   . Postablative hypothyroidism   . Sickle cell anemia (H[facility])   . Spondylolysis   . Thyroid disease     Past Surgical History:  Procedure Laterality Date  . ABDOMINAL HYSTERECTOMY  1996   NO oophorectomy  . BREAST BIOPSY Left 2015   [facility]   . CERVICAL LAMINECTOMY     at C1 w/ duraplasty  . CRANIECTOMY SUBOCCIPITAL W/ CERVICAL LAMINECTOMY / CHIARI  07/05/2011   At C1, performed at [facility]  .  PARATHYROIDECTOMY  2015   [facility]     Current Medications: Current Meds  Medication Sig  . acetaminophen (TYLENOL) 500 MG tablet Take 1,000 mg by mouth every 6 (six) hours as needed.  Marland Kitchen aspirin EC 81 MG tablet Take 81 mg by mouth daily.  Marland Kitchen atorvastatin (LIPITOR) 10 MG tablet Take 1 tablet (10 mg total) by mouth at bedtime.  . Blood Glucose Monitoring Suppl (ONE TOUCH ULTRA 2) w/Device KIT Use as advised  . glucose blood (ONE TOUCH ULTRA TEST) test strip Reported on 07/09/2015  . metFORMIN (GLUCOPHAGE-XR) 500 MG 24 hr tablet Take 1 tablet (500 mg total) by mouth 3 (three) times daily with meals.  . nitroGLYCERIN (NITROSTAT) 0.4 MG SL tablet Place 1 tablet (0.4 mg total) under the tongue every 5 (five) minutes x 3 doses as needed for chest pain.  Marland Kitchen OVER THE COUNTER MEDICATION Health joint  . Potassium 99 MG TABS Take by mouth.     Allergies:   Pollen extract, Citrus, Ibuprofen, and Other   Social History   Socioeconomic History  . Marital status: Married    Spouse name: Not on file  . Number of children: 1  . Years of education: Not on file  . Highest education level: Not  on file  Occupational History  . Occupation: Pharmacist, hospital , high school  Tobacco Use  . Smoking status: Never Smoker  . Smokeless tobacco: Never Used  Substance and Sexual Activity  . Alcohol use: No  . Drug use: No  . Sexual activity: Not Currently    Partners: Male    Comment: 1st intercourse- 59, married- 11 yrs   Other Topics Concern  . Not on file  Social History Narrative   Original from [place]   Married x 72 years   1 daughter in [place]   Social Determinants of Health   Financial Resource Strain:   . Difficulty of Paying Living Expenses: Not on file  Food Insecurity:   . Worried About Charity fundraiser in the Last Year: Not on file  . Ran Out of Food in the Last Year: Not on file  Transportation Needs:   . Lack of Transportation (Medical): Not on file  . Lack of Transportation (Non-Medical):  Not on file  Physical Activity:   . Days of Exercise per Week: Not on file  . Minutes of Exercise per Session: Not on file  Stress:   . Feeling of Stress : Not on file  Social Connections:   . Frequency of Communication with Friends and Family: Not on file  . Frequency of Social Gatherings with Friends and Family: Not on file  . Attends Religious Services: Not on file  . Active Member of Clubs or Organizations: Not on file  . Attends Archivist Meetings: Not on file  . Marital Status: Not on file     Family History: The patient's family history includes Diabetes in her daughter; Hyperlipidemia in her sister; Hypertension in her sister; Stroke in her mother; Thyroid disease in her sister. There is no history of Colon cancer, Sudden death, Heart attack, Breast cancer, or Stomach cancer.  ROS:   Please see the history of present illness.     All other systems reviewed and are negative.  EKGs/Labs/Other Studies Reviewed:    The following studies were reviewed today:   EKG:  EKG is ordered today.  The ekg ordered today demonstrates normal sinus rhythm, rate 68, no ST/T abnormalities  Recent Labs: 09/01/2018: TSH 2.11 01/08/2019: ALT 22; BUN 8; Creatinine, Ser 0.86; Hemoglobin 12.9; Platelets 310; Potassium 4.5; Sodium 140  Recent Lipid Panel    Component Value Date/Time   CHOL 183 10/11/2018 0716   TRIG 104.0 10/11/2018 0716   HDL 65.60 10/11/2018 0716   CHOLHDL 3 10/11/2018 0716   VLDL 20.8 10/11/2018 0716   LDLCALC 97 10/11/2018 0716   LDLCALC 87 09/01/2018 1432    Physical Exam:    VS:  BP (!) 147/74   Pulse 60   Temp (!) 96.6 F (35.9 C)   Ht _0  (1.702 m)   Wt 181 lb (82.1 kg)   SpO2 99%   BMI 28.35 kg/m     Wt Readings from Last 3 Encounters:  02/26/19 181 lb (82.1 kg)  01/16/19 190 lb (86.2 kg)  01/08/19 180 lb (81.6 kg)     GEN:  Well nourished, well developed in no acute distress HEENT: Normal NECK: No JVD; No carotid bruits LYMPHATICS:  No lymphadenopathy CARDIAC: RRR, no murmurs, rubs, gallops RESPIRATORY:  Clear to auscultation without rales, wheezing or rhonchi  ABDOMEN: Soft, non-tender, non-distended MUSCULOSKELETAL:  No edema; No deformity  SKIN: Warm and dry NEUROLOGIC:  Alert and oriented x 3 PSYCHIATRIC:  Normal affect   ASSESSMENT:  1. Chest pain, unspecified type   2. Palpitations   3. Hyperlipidemia, unspecified hyperlipidemia type    PLAN:    In order of problems listed above:  Chest pain: Atypical in description, though can occur with exertionl.  Given risk factors (age, diabetes, hyperlipidemia), warrants further evaluation for obstructive CAD -Coronary CTA  Palpitations: Description concerning for arrhythmia, will check Zio patch x3 days  Hyperlipidemia: On atorvastatin 10 mg daily.  LDL 97 on 10/11/2018  Type 2 diabetes: On Metformin 500 mg 3 times daily.  A1c 6.7 on 01/16/2019.  RTC in 3 months  Medication Adjustments/Labs and Tests Ordered: Current medicines are reviewed at length with the patient today.  Concerns regarding medicines are outlined above.  Orders Placed This Encounter  Procedures  . CT CORONARY MORPH W/CTA COR W/SCORE W/CA W/CM &/OR WO/CM  . CT CORONARY FRACTIONAL FLOW RESERVE DATA PREP  . CT CORONARY FRACTIONAL FLOW RESERVE FLUID ANALYSIS  . Basic metabolic panel  . LONG TERM MONITOR (3-14 DAYS)  . EKG 12-Lead   Meds ordered this encounter  Medications  . metoprolol tartrate (LOPRESSOR) 50 MG tablet    Sig: Take 50 mg (1 tablet) ONE hour prior to CT    Dispense:  1 tablet    Refill:  0    Patient Instructions  Medication Instructions:  Your physician recommends that you continue on your current medications as directed. Please refer to the Current Medication list given to you today.  *If you need a refill on your cardiac medications before your next appointment, please call your pharmacy*  Lab Work: 1 week prior to CT scan (BMET)  Testing/Procedures: Your  physician has requested that you have cardiac CT. Cardiac computed tomography (CT) is a painless test that uses an x-ray machine to take clear, detailed pictures of your heart. For further information please visit [email or web address].[email or web address]. Please follow instruction sheet as given.  ZIO XT- Long Term Monitor Instructions   Your physician has requested you wear your ZIO patch monitor____7___days.   This is a single patch monitor.  [employer] supplies one patch monitor per enrollment.  Additional stickers are not available.   Please do not apply patch if you will be having a Nuclear Stress Test, Echocardiogram, Cardiac CT, MRI, or Chest Xray during the time frame you would be wearing the monitor. The patch cannot be worn during these tests.  You cannot remove and re-apply the ZIO XT patch monitor.   Your ZIO patch monitor will be sent USPS Priority mail from [facility] directly to your home address. The monitor may also be mailed to a PO BOX if home delivery is not available.   It may take 3-5 days to receive your monitor after you have been enrolled.   Once you have received you monitor, please review enclosed instructions.  Your monitor has already been registered assigning a specific monitor serial # to you.   Applying the monitor   Shave hair from upper left chest.   Hold abrader disc by orange tab.  Rub abrader in 40 strokes over left upper chest as indicated in your monitor instructions.   Clean area with 4 enclosed alcohol pads .  Use all pads to assure are is cleaned thoroughly.  Let dry.   Apply patch as indicated in monitor instructions.  Patch will be place under collarbone on left side of chest with arrow pointing upward.   Rub patch adhesive wings for 2 minutes.Remove white label marked "1".  Remove white label marked "2".  Rub patch adhesive wings for 2 additional minutes.   While looking in a mirror, press and release button in center of patch.  A small green light will flash  3-4 times .  This will be your only indicator the monitor has been turned on.     Do not shower for the first 24 hours.  You may shower after the first 24 hours.   Press button if you feel a symptom. You will hear a small click.  Record Date, Time and Symptom in the Patient Log Book.   When you are ready to remove patch, follow instructions on last 2 pages of Patient Log Book.  Stick patch monitor onto last page of Patient Log Book.   Place Patient Log Book in [place] box.  Use locking tab on box and tape box closed securely.  The Orange and [employer] has [employer] on it.  Please place in mailbox as soon as possible.  Your physician should have your test results approximately 7 days after the monitor has been mailed back to [facility].   Call [place] at [phone] if you have questions regarding your ZIO XT patch monitor.  Call them immediately if you see an orange light blinking on your monitor.   If your monitor falls off in less than 4 days contact our Monitor department at [phone].  If your monitor becomes loose or falls off after 4 days call [employer] at [phone] for suggestions on securing your monitor.    Follow-Up: At [facility], you and your health needs are our priority.  As part of our continuing mission to provide you with exceptional heart care, we have created designated Provider Care Teams.  These Care Teams include your primary Cardiologist (physician) and Advanced Practice Providers (APPs -  Physician Assistants and Nurse Practitioners) who all work together to provide you with the care you need, when you need it.  Your next appointment:   3 month(s)  The format for your next appointment:   In Person  Provider:   Oswaldo Milian, MD       Your cardiac CT will be scheduled at one of the below locations:   [facility] [address] [place], [place] [address] [phone]  [place] [address] [place], [place] [address] [phone]  If scheduled at [facility], please arrive at the [facility] main entrance of [facility] 30-45 minutes prior to test start time. Proceed to the [facility] Radiology Department (first floor) to check-in and test prep.  If scheduled at [facility], please arrive 15 mins early for check-in and test prep.  Please follow these instructions carefully (unless otherwise directed):  On the Night Before the Test: . Be sure to Drink plenty of water. . Do not consume any caffeinated/decaffeinated beverages or chocolate 12 hours prior to your test. . Do not take any antihistamines 12 hours prior to your test. . If the patient has contrast allergy: ? Patient will need a prescription for Prednisone and very clear instructions (as follows): 1. Prednisone 50 mg - take 13 hours prior to test 2. Take another Prednisone 50 mg 7 hours prior to test 3. Take another Prednisone 50 mg 1 hour prior to test 4. Take Benadryl 50 mg 1 hour prior to test . Patient must complete all four doses of above prophylactic medications. . Patient will need a ride after test due  to Benadryl.  On the Day of the Test: . Drink plenty of water. Do not drink any water within one hour of the test. . Do not eat any food 4 hours prior to the test. . You may take your regular medications prior to the test.  . Take metoprolol (Lopressor) two hours prior to test. . HOLD Furosemide/Hydrochlorothiazide morning of the test. . FEMALES- please wear underwire-free bra if available          -Drink plenty of water       -Hold Furosemide/hydrochlorothiazide morning of the test       -Take metoprolol (Lopressor) 2 hours prior to test (if applicable).        After the Test: . Drink plenty of water. . After receiving IV contrast, you may experience a mild flushed feeling. This is normal. . On occasion, you  may experience a mild rash up to 24 hours after the test. This is not dangerous. If this occurs, you can take Benadryl 25 mg and increase your fluid intake. . If you experience trouble breathing, this can be serious. If it is severe call 911 IMMEDIATELY. If it is mild, please call our office. . If you take any of these medications: Glipizide/Metformin, Avandament, Glucavance, please do not take 48 hours after completing test unless otherwise instructed.   Once we have confirmed authorization from your insurance company, we will call you to set up a date and time for your test.   For non-scheduling related questions, please contact the cardiac imaging nurse navigator should you have any questions/concerns: Marchia Bond, RN Navigator Cardiac Imaging [facility] Heart and Vascular Services [phone] Office       Signed, Donato Heinz, MD  02/26/2019 10:42 PM    [place]

## 2019-02-26 ENCOUNTER — Ambulatory Visit (INDEPENDENT_AMBULATORY_CARE_PROVIDER_SITE_OTHER): Payer: BC Managed Care – PPO | Admitting: Cardiology

## 2019-02-26 ENCOUNTER — Encounter: Payer: Self-pay | Admitting: Cardiology

## 2019-02-26 ENCOUNTER — Other Ambulatory Visit: Payer: Self-pay

## 2019-02-26 VITALS — BP 147/74 | HR 60 | Temp 96.6°F | Ht 67.0 in | Wt 181.0 lb

## 2019-02-26 DIAGNOSIS — R079 Chest pain, unspecified: Secondary | ICD-10-CM

## 2019-02-26 DIAGNOSIS — R002 Palpitations: Secondary | ICD-10-CM

## 2019-02-26 DIAGNOSIS — E785 Hyperlipidemia, unspecified: Secondary | ICD-10-CM

## 2019-02-26 MED ORDER — METOPROLOL TARTRATE 50 MG PO TABS: ORAL_TABLET | ORAL | 0 refills | Status: DC

## 2019-02-26 NOTE — Patient Instructions (Addendum)
Medication Instructions:  Your physician recommends that you continue on your current medications as directed. Please refer to the Current Medication list given to you today.  *If you need a refill on your cardiac medications before your next appointment, please call your pharmacy*  Lab Work: 1 week prior to CT scan (BMET)  Testing/Procedures: Your physician has requested that you have cardiac CT. Cardiac computed tomography (CT) is a painless test that uses an x-ray machine to take clear, detailed pictures of your heart. For further information please visit [email or web address] Please follow instruction sheet as given.  ZIO XT- Long Term Monitor Instructions   Your physician has requested you wear your ZIO patch monitor____7___days.   This is a single patch monitor.  Ir[employer] supplies one patch monitor per enrollment.  Additional stickers are not available.   Please do not apply patch if you will be having a Nuclear Stress Test, Echocardiogram, Cardiac CT, MRI, or Chest Xray during the time frame you would be wearing the monitor. The patch cannot be worn during these tests.  You cannot remove and re-apply the ZIO XT patch monitor.   Your ZIO patch monitor will be sent USPS Priority mail from [facility] directly to your home address. The monitor may also be mailed to a PO BOX if home delivery is not available.   It may take 3-5 days to receive your monitor after you have been enrolled.   Once you have received you monitor, please review enclosed instructions.  Your monitor has already been registered assigning a specific monitor serial # to you.   Applying the monitor   Shave hair from upper left chest.   Hold abrader disc by orange tab.  Rub abrader in 40 strokes over left upper chest as indicated in your monitor instructions.   Clean area with 4 enclosed alcohol pads .  Use all pads to assure are is cleaned thoroughly.  Let dry.   Apply patch as indicated in monitor  instructions.  Patch will be place under collarbone on left side of chest with arrow pointing upward.   Rub patch adhesive wings for 2 minutes.Remove white label marked "1".  Remove white label marked "2".  Rub patch adhesive wings for 2 additional minutes.   While looking in a mirror, press and release button in center of patch.  A small green light will flash 3-4 times .  This will be your only indicator the monitor has been turned on.     Do not shower for the first 24 hours.  You may shower after the first 24 hours.   Press button if you feel a symptom. You will hear a small click.  Record Date, Time and Symptom in the Patient Log Book.   When you are ready to remove patch, follow instructions on last 2 pages of Patient Log Book.  Stick patch monitor onto last page of Patient Log Book.   Place Patient Log Book in [place] box.  Use locking tab on box and tape box closed securely.  The Orange and [employer] has [employer] on it.  Please place in mailbox as soon as possible.  Your physician should have your test results approximately 7 days after the monitor has been mailed back to [facility].   Call [facility] Customer Care at [phone] if you have questions regarding your ZIO XT patch monitor.  Call them immediately if you see an orange light blinking on your monitor.   If your monitor falls off in  less than 4 days contact our Monitor department at [phone].  If your monitor becomes loose or falls off after 4 days call [employer] at [phone] for suggestions on securing your monitor.    Follow-Up: At [facility], you and your health needs are our priority.  As part of our continuing mission to provide you with exceptional heart care, we have created designated Provider Care Teams.  These Care Teams include your primary Cardiologist (physician) and Advanced Practice Providers (APPs -  Physician Assistants and Nurse Practitioners) who all work together to provide you with  the care you need, when you need it.  Your next appointment:   3 month(s)  The format for your next appointment:   In Person  Provider:   Epifanio Lesches, MD       Your cardiac CT will be scheduled at one of the below locations:   [facility] [address] [place], [place] [address] [phone]  OR  [facility] [address] [place], [place] [address] [phone]  If scheduled at [facility], please arrive at the [facility] main entrance of [facility] 30-45 minutes prior to test start time. Proceed to the [facility] Radiology Department (first floor) to check-in and test prep.  If scheduled at [facility], please arrive 15 mins early for check-in and test prep.  Please follow these instructions carefully (unless otherwise directed):  On the Night Before the Test: . Be sure to Drink plenty of water. . Do not consume any caffeinated/decaffeinated beverages or chocolate 12 hours prior to your test. . Do not take any antihistamines 12 hours prior to your test. . If the patient has contrast allergy: ? Patient will need a prescription for Prednisone and very clear instructions (as follows): 1. Prednisone 50 mg - take 13 hours prior to test 2. Take another Prednisone 50 mg 7 hours prior to test 3. Take another Prednisone 50 mg 1 hour prior to test 4. Take Benadryl 50 mg 1 hour prior to test . Patient must complete all four doses of above prophylactic medications. . Patient will need a ride after test due to Benadryl.  On the Day of the Test: . Drink plenty of water. Do not drink any water within one hour of the test. . Do not eat any food 4 hours prior to the test. . You may take your regular medications prior to the test.  . Take metoprolol (Lopressor) two hours prior to test. . HOLD Furosemide/Hydrochlorothiazide morning of the test. . FEMALES- please wear  underwire-free bra if available          -Drink plenty of water       -Hold Furosemide/hydrochlorothiazide morning of the test       -Take metoprolol (Lopressor) 2 hours prior to test (if applicable).        After the Test: . Drink plenty of water. . After receiving IV contrast, you may experience a mild flushed feeling. This is normal. . On occasion, you may experience a mild rash up to 24 hours after the test. This is not dangerous. If this occurs, you can take Benadryl 25 mg and increase your fluid intake. . If you experience trouble breathing, this can be serious. If it is severe call 911 IMMEDIATELY. If it is mild, please call our office. . If you take any of these medications: Glipizide/Metformin, Avandament, Glucavance, please do not take 48 hours after completing test unless  otherwise instructed.   Once we have confirmed authorization from your insurance company, we will call you to set up a date and time for your test.   For non-scheduling related questions, please contact the cardiac imaging nurse navigator should you have any questions/concerns: Marchia Bond, RN Navigator Cardiac Imaging [facility] [phone] Office

## 2019-02-27 ENCOUNTER — Encounter: Payer: Self-pay | Admitting: *Deleted

## 2019-02-27 ENCOUNTER — Telehealth: Payer: Self-pay | Admitting: Cardiology

## 2019-02-27 NOTE — Telephone Encounter (Signed)
Spoke to patient-she states last week at Dr. Leta Jungling office he told her to stay out of work until next Monday 2/8.   Her work is now requesting a letter from her cardiologist.   Advised reviewed with Dr. Bjorn Pippin and he is ok with returning to work as normal.   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  She states she has to stop talking due to SOB.    She is wondering is Dr. Bjorn Pippin can provide a work note excusing her this week or they will not pay her.   Advised at this time we are unsure if her symptoms are cardiac related (testing being done to evaluate).  Advised to contact Dr. Drue Novel office to see if he can provide this note given this was per his instructions.    She also states she is needing FMLA forms filled out.  Advised okay to drop by office, medical records will assist with this process.      Patient was very understanding and appreciated the call.  Advised to call back if we can assist her/if Dr. Drue Novel unable to provide letter.   Patient aware.

## 2019-02-27 NOTE — Telephone Encounter (Signed)
Follow up  ° ° °Pt is returning call  ° ° °Please call back  °

## 2019-02-27 NOTE — Telephone Encounter (Signed)
Follow Up ° °Patient is returning call. Please give patient a call back.  °

## 2019-02-27 NOTE — Progress Notes (Signed)
Patient ID: Abigail [name], female   DOB: 1950-11-11, 69 y.o.   MRN: [id number] Patient enrolled for [employer] to mail a 7 day ZIO XT long term holter monitor to her home.

## 2019-02-27 NOTE — Telephone Encounter (Signed)
Left message to call back  

## 2019-02-27 NOTE — Telephone Encounter (Signed)
Left message to call back. Letter completed and placed at front desk for pick up.   No FMLA forms have been received at this time.

## 2019-02-27 NOTE — Telephone Encounter (Signed)
New message:     Patient calling stating that some FLMA papers to be filled out. Also patient need to speak with some one. 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 Patient states she need it by 12 pm today.

## 2019-02-28 ENCOUNTER — Telehealth: Payer: Self-pay | Admitting: Cardiology

## 2019-02-28 NOTE — Telephone Encounter (Signed)
02/28/2019 Patient walked in office with FMLA Form from [place] [employer] and 25.00 check, I then forward it to [employer] to process.  cbr

## 2019-03-01 ENCOUNTER — Telehealth: Payer: Self-pay | Admitting: Internal Medicine

## 2019-03-01 NOTE — Telephone Encounter (Signed)
LM to notify pt the email she sent did not have an attachment. I emailed her back on 02/26/2019 but she had not responded.

## 2019-03-02 ENCOUNTER — Other Ambulatory Visit (INDEPENDENT_AMBULATORY_CARE_PROVIDER_SITE_OTHER): Payer: BC Managed Care – PPO

## 2019-03-02 DIAGNOSIS — R002 Palpitations: Secondary | ICD-10-CM | POA: Diagnosis not present

## 2019-03-05 ENCOUNTER — Encounter: Payer: Self-pay | Admitting: Internal Medicine

## 2019-03-05 ENCOUNTER — Ambulatory Visit (INDEPENDENT_AMBULATORY_CARE_PROVIDER_SITE_OTHER): Payer: BC Managed Care – PPO | Admitting: Internal Medicine

## 2019-03-05 ENCOUNTER — Other Ambulatory Visit: Payer: Self-pay

## 2019-03-05 ENCOUNTER — Ambulatory Visit: Payer: BC Managed Care – PPO | Attending: Internal Medicine

## 2019-03-05 VITALS — Ht 67.0 in

## 2019-03-05 DIAGNOSIS — B349 Viral infection, unspecified: Secondary | ICD-10-CM

## 2019-03-05 DIAGNOSIS — R079 Chest pain, unspecified: Secondary | ICD-10-CM | POA: Diagnosis not present

## 2019-03-05 DIAGNOSIS — Z20822 Contact with and (suspected) exposure to covid-19: Secondary | ICD-10-CM

## 2019-03-05 MED ORDER — ALBUTEROL SULFATE HFA 108 (90 BASE) MCG/ACT IN AERS: 2.0000 | INHALATION_SPRAY | Freq: Four times a day (QID) | RESPIRATORY_TRACT | 1 refills | Status: DC | PRN
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
## 2019-03-05 NOTE — Progress Notes (Signed)
Pre visit review using our clinic review tool, if applicable. No additional management support is needed unless otherwise documented below in the visit note. 

## 2019-03-05 NOTE — Assessment & Plan Note (Signed)
Viral syndrome: As described above, I did recommend the patient to be check for Covid, she will let me know the outcome of the testing. Otherwise we agreed on rest, fluids, Robitussin-DM, a refill for albuterol sent to be used as needed for wheezing. Red flag symptoms that indicate a need to go to the ER discussed with the patient including severe chest pain, increased shortness of breath, high fever, severe headache. DOE, chest pain:Seen by cardiology 02/26/2019, they recommended coronary CTA.  Also due to palpitations was Rx Zio patch. Patient education: We talk about the Covid vaccine vaccination, encouraged her to proceed with that when available to her.

## 2019-03-06 LAB — NOVEL CORONAVIRUS, NAA: SARS-CoV-2, NAA: NOT DETECTED

## 2019-03-07 ENCOUNTER — Other Ambulatory Visit: Payer: Self-pay

## 2019-03-07 ENCOUNTER — Ambulatory Visit: Payer: BC Managed Care – PPO | Admitting: Neurology

## 2019-03-07 ENCOUNTER — Encounter: Payer: Self-pay | Admitting: Neurology

## 2019-03-07 VITALS — BP 120/63 | HR 68 | Temp 97.1°F | Ht 67.0 in | Wt 183.0 lb

## 2019-03-07 DIAGNOSIS — M7918 Myalgia, other site: Secondary | ICD-10-CM | POA: Diagnosis not present

## 2019-03-07 MED ORDER — CYCLOBENZAPRINE HCL 10 MG PO TABS: ORAL_TABLET | ORAL | 3 refills | Status: DC

## 2019-03-07 NOTE — Patient Instructions (Signed)
Flexeril at bedtime (can take during the day as weill but watch for sedation and being tired if you take it, do not drive until you know how this medication affects you) Physical Therapy to include posture management, stretching, dry needling May consider a pain clinic  Cyclobenzaprine tablets What is this medicine? CYCLOBENZAPRINE (sye kloe BEN za preen) is a muscle relaxer. It is used to treat muscle pain, spasms, and stiffness. This medicine may be used for other purposes; ask your health care provider or pharmacist if you have questions. COMMON BRAND NAME(S): Fexmid, Flexeril What should I tell my health care provider before I take this medicine? They need to know if you have any of these conditions:  heart disease, irregular heartbeat, or previous heart attack  liver disease  thyroid problem  an unusual or allergic reaction to cyclobenzaprine, tricyclic antidepressants, lactose, other medicines, foods, dyes, or preservatives  pregnant or trying to get pregnant  breast-feeding How should I use this medicine? Take this medicine by mouth with a glass of water. Follow the directions on the prescription label. If this medicine upsets your stomach, take it with food or milk. Take your medicine at regular intervals. Do not take it more often than directed. Talk to your pediatrician regarding the use of this medicine in children. Special care may be needed. Overdosage: If you think you have taken too much of this medicine contact a poison control center or emergency room at once. NOTE: This medicine is only for you. Do not share this medicine with others. What if I miss a dose? If you miss a dose, take it as soon as you can. If it is almost time for your next dose, take only that dose. Do not take double or extra doses. What may interact with this medicine? Do not take this medicine with any of the following medications:  MAOIs like Carbex, Eldepryl, Marplan, Nardil, and  Parnate  narcotic medicines for cough  safinamide This medicine may also interact with the following medications:  alcohol  bupropion  antihistamines for allergy, cough and cold  certain medicines for anxiety or sleep  certain medicines for bladder problems like oxybutynin, tolterodine  certain medicines for depression like amitriptyline, fluoxetine, sertraline  certain medicines for Parkinson's disease like benztropine, trihexyphenidyl  certain medicines for seizures like phenobarbital, primidone  certain medicines for stomach problems like dicyclomine, hyoscyamine  certain medicines for travel sickness like scopolamine  general anesthetics like halothane, isoflurane, methoxyflurane, propofol  ipratropium  local anesthetics like lidocaine, pramoxine, tetracaine  medicines that relax muscles for surgery  narcotic medicines for pain  phenothiazines like chlorpromazine, mesoridazine, prochlorperazine, thioridazine  verapamil This list may not describe all possible interactions. Give your health care provider a list of all the medicines, herbs, non-prescription drugs, or dietary supplements you use. Also tell them if you smoke, drink alcohol, or use illegal drugs. Some items may interact with your medicine. What should I watch for while using this medicine? Tell your doctor or health care professional if your symptoms do not start to get better or if they get worse. You may get drowsy or dizzy. Do not drive, use machinery, or do anything that needs mental alertness until you know how this medicine affects you. Do not stand or sit up quickly, especially if you are an older patient. This reduces the risk of dizzy or fainting spells. Alcohol may interfere with the effect of this medicine. Avoid alcoholic drinks. If you are taking another medicine that also causes drowsiness,  you may have more side effects. Give your health care provider a list of all medicines you use. Your  doctor will tell you how much medicine to take. Do not take more medicine than directed. Call emergency for help if you have problems breathing or unusual sleepiness. Your mouth may get dry. Chewing sugarless gum or sucking hard candy, and drinking plenty of water may help. Contact your doctor if the problem does not go away or is severe. What side effects may I notice from receiving this medicine? Side effects that you should report to your doctor or health care professional as soon as possible:  allergic reactions like skin rash, itching or hives, swelling of the face, lips, or tongue  breathing problems  chest pain  fast, irregular heartbeat  hallucinations  seizures  unusually weak or tired Side effects that usually do not require medical attention (report to your doctor or health care professional if they continue or are bothersome):  headache  nausea, vomiting This list may not describe all possible side effects. Call your doctor for medical advice about side effects. You may report side effects to FDA at 1-800-FDA-1088. Where should I keep my medicine? Keep out of the reach of children. Store at room temperature between 15 and 30 degrees C (59 and 86 degrees F). Keep container tightly closed. Throw away any unused medicine after the expiration date. NOTE: This sheet is a summary. It may not cover all possible information. If you have questions about this medicine, talk to your doctor, pharmacist, or health care provider.  2020 Elsevier/Gold Standard (2017-12-14 12:49:26)  Musculoskeletal Pain Musculoskeletal pain refers to aches and pains in your bones, joints, muscles, and the tissues that surround them. This pain can occur in any part of the body. It can last for a short time (acute) or a long time (chronic). A physical exam, lab tests, and imaging studies may be done to find the cause of your musculoskeletal pain. Follow these instructions at home:  Lifestyle  Try to  control or lower your stress levels. Stress increases muscle tension and can worsen musculoskeletal pain. It is important to recognize when you are anxious or stressed and learn ways to manage it. This may include: ? Meditation or yoga. ? Cognitive or behavioral therapy. ? Acupuncture or massage therapy.  You may continue all activities unless the activities cause more pain. When the pain gets better, slowly resume your normal activities. Gradually increase the intensity and duration of your activities or exercise. Managing pain, stiffness, and swelling  Take over-the-counter and prescription medicines only as told by your health care provider.  When your pain is severe, bed rest may be helpful. Lie or sit in any position that is comfortable, but get out of bed and walk around at least every couple of hours.  If directed, apply heat to the affected area as often as told by your health care provider. Use the heat source that your health care provider recommends, such as a moist heat pack or a heating pad. ? Place a towel between your skin and the heat source. ? Leave the heat on for 20-30 minutes. ? Remove the heat if your skin turns bright red. This is especially important if you are unable to feel pain, heat, or cold. You may have a greater risk of getting burned.  If directed, put ice on the painful area. ? Put ice in a plastic bag. ? Place a towel between your skin and the bag. ? Leave  the ice on for 20 minutes, 2-3 times a day. General instructions  Your health care provider may recommend that you see a physical therapist. This person can help you come up with a safe exercise program. Do any exercises as told by your physical therapist.  Keep all follow-up visits, including any physical therapy visits, as told by your health care providers. This is important. Contact a health care provider if:  Your pain gets worse.  Medicines do not help ease your pain.  You cannot use the part  of your body that hurts, such as your arm, leg, or neck.  You have trouble sleeping.  You have trouble doing your normal activities. Get help right away if:  You have a new injury and your pain is worse or different.  You feel numb or you have tingling in the painful area. Summary  Musculoskeletal pain refers to aches and pains in your bones, joints, muscles, and the tissues that surround them.  This pain can occur in any part of the body.  Your health care provider may recommend that you see a physical therapist. This person can help you come up with a safe exercise program. Do any exercises as told by your physical therapist.  Lower your stress level. Stress can worsen musculoskeletal pain. Ways to lower stress may include meditation, yoga, cognitive or behavioral therapy, acupuncture, and massage therapy. This information is not intended to replace advice given to you by your health care provider. Make sure you discuss any questions you have with your health care provider. Document Revised: 12/24/2016 Document Reviewed: 02/11/2016 Elsevier Patient Education  2020 [employer].

## 2019-03-07 NOTE — Progress Notes (Addendum)
[facility]    Provider:  Dr Jaynee Eagles Requesting Provider: Asencion Partridge Dohmeier Primary Care Provider:  Colon Branch, MD  CC:  headaches  HPI:  Abigail [name] is a 69 y.o. female here as requested by Larey Seat, MD for headaches. PMHx sickle cell anemia, thyroid disease, hypertension, hyperparathyroidism, hyperlipidemia, diabetes, asthma, Chiari malformation status post decompression June 2013 with recurrent symptoms.  My colleague Dr. Brett Fairy placed an order for MRI of the brain and cervical spine.  I reviewed MRI of the brain report, Chiari I malformation again noted post suboccipital craniectomy and C1 laminectomy in unchanged position of the cerebellar tonsils.  Otherwise unremarkable.  Normal cord signal no syrinx.She has a lot of pain in the occipital areas and radiating down the down the shoulders.  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 She denies any other pain.  Mostly in the back, does not radiate to the front of the head, she has tried flexeril in 2012 and diclofenac, also tried gabapentin, metoprolol, tramadol, prednisone.   Review of Systems: Patient complains of symptoms per HPI as well as the following symptoms: neck and low back pain. Pertinent negatives and positives per HPI. All others negative.   Social History   Socioeconomic History  . Marital status: Married    Spouse name: Not on file  . Number of children: 1  . Years of education: Not on file  . Highest education level: Not on file  Occupational History  . Occupation: Pharmacist, hospital , high school  Tobacco Use  . Smoking status: Never Smoker  . Smokeless tobacco: Never Used  Substance and Sexual Activity  . Alcohol use: No  . Drug use: No  . Sexual activity: Not Currently    Partners: Male    Comment: 1st intercourse- 88, married- 1 yrs   Other Topics  Concern  . Not on file  Social History Narrative   Original from [place]   Married x 51 years   1 daughter in [place]   Social Determinants of Health   Financial Resource Strain:   . Difficulty of Paying Living Expenses: Not on file  Food Insecurity:   . Worried About Charity fundraiser in the Last Year: Not on file  . Ran Out of Food in the Last Year: Not on file  Transportation Needs:   . Lack of Transportation (Medical): Not on file  . Lack of Transportation (Non-Medical): Not on file  Physical Activity:   . Days of Exercise per Week: Not on file  . Minutes of Exercise per Session: Not on file  Stress:   . Feeling of Stress : Not on file  Social Connections:   . Frequency of Communication with Friends and Family: Not on file  . Frequency of Social Gatherings with Friends and Family: Not on file  . Attends Religious Services: Not on file  . Active Member of Clubs or Organizations: Not on file  . Attends Archivist Meetings: Not on file  . Marital Status: Not on file  Intimate Partner Violence:   . Fear of Current or Ex-Partner: Not on file  . Emotionally Abused: Not on file  . Physically Abused: Not on file  . Sexually Abused: Not on file    Family History  Problem Relation Age of Onset  . Hypertension Sister  sister, daughter , mother   . Hyperlipidemia Sister   . Thyroid disease Sister   . Diabetes Daughter   . Stroke Mother   . Colon cancer Neg Hx   . Sudden death Neg Hx   . Heart attack Neg Hx   . Breast cancer Neg Hx   . Stomach cancer Neg Hx     Past Medical History:  Diagnosis Date  . Asthma   . Chiari malformation type I ([place])   . Diabetes mellitus   . GERD (gastroesophageal reflux disease)   . Hx of gout   . Hyperlipidemia   . Hyperparathyroidism ([place])   . Hypertension   . Lumbar radiculopathy   . Postablative hypothyroidism   . Sickle cell anemia (HCC)   . Spondylolysis   . Thyroid disease     Patient Active Problem List     Diagnosis Date Noted  . Cervical myofascial pain syndrome 03/07/2019  . 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 Somatic complaints, multiple 08/09/2017  . Annual physical exam 08/27/2015  . Controlled type 2 diabetes mellitus without complication, without long-term current use of insulin ([place]) 05/06/2015  . H/o Primary hyperparathyroidism ([place]) 05/06/2015  . PCP NOTES >>>>> 01/23/2015  . Chiari malformation type I ([place])   . Sickle cell anemia (HCC)   . Lumbar radiculopathy   . Spondylolysis   . Hx of gout   . Thyroid disorder 01/02/2015  . Allergic rhinitis 01/02/2015    Past Surgical History:  Procedure Laterality Date  . ABDOMINAL HYSTERECTOMY  1994   NO oophorectomy  . BREAST BIOPSY Left 2015   [facility]   . CERVICAL LAMINECTOMY     at C1 w/ duraplasty  . CRANIECTOMY SUBOCCIPITAL W/ CERVICAL LAMINECTOMY / CHIARI  07/05/2011   At C1, performed at [facility]  . PARATHYROIDECTOMY  2015   [facility]     Current Outpatient Medications  Medication Sig Dispense Refill  . acetaminophen (TYLENOL) 500 MG tablet Take 1,000 mg by mouth every 6 (six) hours as needed.    Marland Kitchen albuterol (VENTOLIN HFA) 108 (90 Base) MCG/ACT inhaler Inhale 2 puffs into the lungs every 6 (six) hours as needed for wheezing or shortness of breath. 18 g 1  . aspirin EC 81 MG tablet Take 81 mg by mouth daily.    Marland Kitchen atorvastatin (LIPITOR) 10 MG tablet Take 1 tablet (10 mg total) by mouth at bedtime. 30 tablet 3  . Blood Glucose Monitoring Suppl (ONE TOUCH ULTRA 2) w/Device KIT Use as advised 1 kit 0  . glucose blood (ONE TOUCH ULTRA TEST) test strip Reported on 07/09/2015    . metFORMIN (GLUCOPHAGE-XR) 500 MG 24 hr tablet Take 1 tablet (500 mg total) by mouth 3 (three) times daily with meals. 270 tablet 3  . nitroGLYCERIN (NITROSTAT) 0.4 MG SL tablet Place 1 tablet (0.4 mg total) under the tongue every 5 (five) minutes x 3 doses as needed for chest pain. 30 tablet 0  . OVER THE COUNTER MEDICATION Health joint    . Potassium 99 MG  TABS Take by mouth.    . cyclobenzaprine (FLEXERIL) 10 MG tablet Take 1 tablet at bedtime. May also take additional 1/2 tablet up to twice daily. 60 tablet 3   No current facility-administered medications for this visit.    Allergies as of 03/07/2019 - Review Complete 03/07/2019  Allergen Reaction Noted  . Pollen extract Itching 04/19/2013  . Citrus Itching, Rash, and Swelling 12/06/2014  . Ibuprofen Swelling and Rash 11/12/2010  . Other Itching,  Rash, and Swelling 12/06/2014    Vitals: BP 120/63 (BP Location: Left Arm, Patient Position: Sitting)   Pulse 68   Temp (!) 97.1 F (36.2 C) Comment: taken at front  Ht 5' 7"  (1.702 m)   Wt 183 lb (83 kg)   BMI 28.66 kg/m  Last Weight:  Wt Readings from Last 1 Encounters:  03/07/19 183 lb (83 kg)   Last Height:   Ht Readings from Last 1 Encounters:  03/07/19 5' 7"  (1.702 m)     Physical exam: Exam: Gen: NAD, conversant, well nourised, well groomed                     CV: RRR, no MRG. No Carotid Bruits. No peripheral edema, warm, nontender Eyes: Conjunctivae clear without exudates or hemorrhage MSK: tight cervical muscles, particularly trapezius, decreased ROM of the neck/head  Neuro: Detailed Neurologic Exam  Speech:    Speech is normal; fluent and spontaneous with normal comprehension.  Cognition:    The patient is oriented to person, place, and time;     recent and remote memory intact;     language fluent;     normal attention, concentration,     fund of knowledge Cranial Nerves:    The pupils are equal, round, and reactive to light.  Visual fields are full to finger confrontation. Extraocular movements are intact. Trigeminal sensation is intact and the muscles of mastication are normal. The face is symmetric. The palate elevates in the midline. Hearing intact. Voice is normal. Shoulder shrug is normal. The tongue has normal motion without fasciculations.   Coordination:    No dysmetria  Gait:    Heel-toe and  tandem gait are normal.   Motor Observation:    No asymmetry, no atrophy, and no involuntary movements noted. Tone:    Normal muscle tone.      Strength:    Strength is equal and non focal, in the upper and lower limbs.         Assessment/Plan:  69 y.o. female here as requested by Larey Seat, MD for headaches. PMHx sickle cell anemia, thyroid disease, hypertension, hyperparathyroidism, hyperlipidemia, diabetes, asthma, Chiari malformation status post decompression June 2013 with recurrent symptoms.  My colleague Dr. Brett Fairy placed an order for MRI of the brain and cervical spine.  I reviewed MRI of the brain report, Chiari I malformation again noted post suboccipital craniectomy and C1 laminectomy in unchanged position of the cerebellar tonsils.  Otherwise unremarkable.  Normal cord signal no syrinx   At this point she has a lot of myofascial cervical muscle pain, more chronic pain in the muscles of the neck and vervico-occipital area around her scarr from decompression. Unfortunately sometimes this does not respond well to many medications even muscle relaxers - howeverver dry needling and other injections help better along with PT. We can try flexeril at bedtime, she really need physical therapy for posture, stretching, dry needling.   - Muscle relaxers at bedtime - I will defer her handicapped sticker to primary care and PT, today her gait appears normal, she got up, ambulated well without assistance of any kind, I don't think her musculoskeletal neck pain would interfere with ambulation, she appears stable, will defer to pcp and physical therapy. - She can follow up with Amy in 3 months however at that point I discussed with patient she may need to be referred to a pain clinic where they can provide support for her chronic pain and more interventions such  as injections, trigger points or other, she requests tramadol and this would also have to be through a pain clinic. We could try Lyrica  low dose or cymbalta in 3 months or sooner but recommend pain clinic, Amy Lomax NP to decide at that time.   Orders Placed This Encounter  Procedures  . Ambulatory referral to Physical Therapy   Meds ordered this encounter  Medications  . cyclobenzaprine (FLEXERIL) 10 MG tablet    Sig: Take 1 tablet at bedtime. May also take additional 1/2 tablet up to twice daily.    Dispense:  60 tablet    Refill:  3    Cc: Colon Branch, MD,  Larey Seat, MD  A total of 40 minutes was spent on this patient's care, reviewing imaging, past records, recent hospitalization notes and results. Over half this time was spent on counseling patient on the  1. Cervical myofascial pain syndrome    diagnosis and different diagnostic and therapeutic options, counseling and coordination of care, risks and benefitsof management, compliance, or risk factor reduction and education.     Sarina Ill, MD  [facility] [address] [place], [place] [address]  Phone [phone] Fax [phone]

## 2019-03-13 ENCOUNTER — Telehealth: Payer: Self-pay | Admitting: Cardiology

## 2019-03-13 NOTE — Telephone Encounter (Signed)
FMLA forms received.   CT not until 3/17-Dr. Bjorn Pippin to review

## 2019-03-13 NOTE — Telephone Encounter (Signed)
Dr. Bjorn Pippin called and spoke to patient, FMLA signed.    Given to medical records and aware must be sent today.

## 2019-03-13 NOTE — Telephone Encounter (Signed)
New Message    Pt is calling and says she needs her FMLA paperwork today or she will loose payment    Please call

## 2019-03-16 ENCOUNTER — Telehealth: Payer: Self-pay | Admitting: Internal Medicine

## 2019-03-16 NOTE — Telephone Encounter (Signed)
03-16-19 patient dropped off FMLA form to be filled out . Patient would like form to be faxed to [employer]ist [phone] [email or web address]

## 2019-03-19 NOTE — Telephone Encounter (Signed)
FMLA paperwork completed except for page 2-- placed in PCP red folder for completion/signing.

## 2019-03-20 DIAGNOSIS — Z0279 Encounter for issue of other medical certificate: Secondary | ICD-10-CM

## 2019-03-20 NOTE — Telephone Encounter (Signed)
FMLA paperwork faxed to ATTN: Milford Cage at [phone]. Forms sent for scanning.

## 2019-03-20 NOTE — Telephone Encounter (Signed)
Received fax confirmation

## 2019-03-20 NOTE — Telephone Encounter (Signed)
Extensive paperwork completed 

## 2019-03-26 NOTE — Telephone Encounter (Signed)
03/26/2019 Dr. Bjorn Pippin referred FMLA back to PCP. cbr

## 2019-03-29 ENCOUNTER — Telehealth: Payer: Self-pay | Admitting: Internal Medicine

## 2019-03-29 NOTE — Telephone Encounter (Addendum)
Patient requesting a handicap form filled out . Patient needs form for DMV.

## 2019-03-29 NOTE — Telephone Encounter (Signed)
I don't see where we have ever completed one for her before.

## 2019-03-29 NOTE — Telephone Encounter (Signed)
Have we done that for her before? I don't if she is handicapped

## 2019-03-29 NOTE — Telephone Encounter (Signed)
Please advise 

## 2019-03-30 NOTE — Telephone Encounter (Signed)
Denied, advised patient we can discuss at the next visit

## 2019-03-30 NOTE — Telephone Encounter (Signed)
LMOM informing Pt to return call.  

## 2019-04-11 ENCOUNTER — Ambulatory Visit (HOSPITAL_COMMUNITY): Payer: BC Managed Care – PPO

## 2019-04-24 ENCOUNTER — Telehealth: Payer: Self-pay | Admitting: Internal Medicine

## 2019-04-24 NOTE — Telephone Encounter (Signed)
Pt dropped off document to be filled out by provider ( Parking Disability Placard 2 page) Pt would like document to be mailed to pt when document ready to: [address] [place], [place] [address]. Document put at front office tray under providers name.

## 2019-04-24 NOTE — Telephone Encounter (Signed)
Abigail [name] declined to complete form on 03/29/2019- he stated he would discuss w/ her at next OV.

## 2019-04-25 ENCOUNTER — Other Ambulatory Visit: Payer: Self-pay

## 2019-04-25 ENCOUNTER — Telehealth (INDEPENDENT_AMBULATORY_CARE_PROVIDER_SITE_OTHER): Payer: BC Managed Care – PPO | Admitting: Internal Medicine

## 2019-04-25 ENCOUNTER — Encounter: Payer: Self-pay | Admitting: Internal Medicine

## 2019-04-25 VITALS — Ht 67.0 in | Wt 181.0 lb

## 2019-04-25 DIAGNOSIS — G935 Compression of brain: Secondary | ICD-10-CM

## 2019-04-25 DIAGNOSIS — M7918 Myalgia, other site: Secondary | ICD-10-CM

## 2019-04-25 DIAGNOSIS — Z09 Encounter for follow-up examination after completed treatment for conditions other than malignant neoplasm: Secondary | ICD-10-CM | POA: Diagnosis not present
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
## 2019-04-25 NOTE — Progress Notes (Signed)
Pre visit review using our clinic review tool, if applicable. No additional management support is needed unless otherwise documented below in the visit note. 

## 2019-04-25 NOTE — Telephone Encounter (Signed)
Pt seen today, 04/25/2019 virtually- PCP completed form. Form mailed back to Pt as requested- copy of form sent for scanning.

## 2019-04-26 ENCOUNTER — Telehealth: Payer: Self-pay | Admitting: Internal Medicine

## 2019-04-26 NOTE — Telephone Encounter (Signed)
Spoke w/ Pt- informed of PCP recommendations. Pt verbalized understanding.  

## 2019-04-26 NOTE — Telephone Encounter (Signed)
Yesterday had sore throat now she has what seems to be a viral syndrome, recommend urgent care

## 2019-04-26 NOTE — Telephone Encounter (Signed)
Please advise 

## 2019-04-26 NOTE — Telephone Encounter (Signed)
Patient was told to call back if her sxs got worse. She is now still feeling bad.. headache cough sore throat and her nose is burining and body aches please call back to advise

## 2019-04-28 NOTE — Assessment & Plan Note (Signed)
Neck pain, right-sided pain: Since the last visit, she saw neurology, Dr. Lucia Gaskins, neck MRI was reviewed, they noted a Chiari I malformation, status post suboccipital craniectomy and C1 laminectomy.  Unchanged position of the cerebellar tonsils. Pain felt to be myofascial, related to the muscles around the surgical scar.  Dr. Daisy Blossom noted that the treatment of that pain is difficult and the discussed possible future options: Dry needling, or other injections, Lyrica, Cymbalta and a pain clinic evaluation. I wonder if symptoms represent fibromyalgia, I shared my thoughts with the patient and she also wonder about that.. Patient reports neurology declined it to sign a handicap sticker however she tells me today that the pain is severe and she sometimes even needs a cane when she parks her car and goes to the office. Handicap sticker signed. Moving forward, she will have to try therapy as outlined by neurology before I continue signing handicap stickers. Sore throat: With no other symptoms, continue symptomatic treatment with OTCs, if symptoms increase she might need to be tested for Covid or other infections. She had the first Covid shot already.

## 2019-06-03 NOTE — Progress Notes (Deleted)
Cardiology Office Note:    Date:  06/03/2019   ID:  Abigail [name], DOB 20-Jan-1951, MRN [id number]  PCP:  Colon Branch, MD  Cardiologist:  No primary care provider on file.  Electrophysiologist:  None   Referring MD: Colon Branch, MD   No chief complaint on file.   History of Present Illness:    Abigail [name] is a 69 y.o. female with a hx of type 2 diabetes, hyperparathyroidism, thyroid disease, Chiari malformation type I status post neurosurgery in 2013 at [facility] who presents for follow-up.  She was referred by Dr. Larose Kells for evaluation of chest pain.   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  When she does these activities she notices she gets short of breath easily.  Also has been having chest pain.  She describes chest pain as dull aching pain in center of her chest.  Resolves with rest after a few minutes.  She has noted a few episodes of chest pain at rest.  Never smoked.  No family history heart disease.  Also reports palpitations.  Feels like heart is racing, lasts about 10 to 15 minutes.  Occurs about once per day.  Coronary CT was ordered but was not performed.  Zio patch x6 days showed no significant abnormalities, brief episodes of SVT with longest lasting 9 seconds.   Past Medical History:  Diagnosis Date  . Asthma   . Chiari malformation type I ([place])   . Diabetes mellitus   . GERD (gastroesophageal reflux disease)   . Hx of gout   . Hyperlipidemia   . Hyperparathyroidism ([place])   . Hypertension   . Lumbar radiculopathy   . Postablative hypothyroidism   . Sickle cell anemia (HCC)   . Spondylolysis   . Thyroid disease     Past Surgical History:  Procedure Laterality Date  . ABDOMINAL HYSTERECTOMY  1994   NO oophorectomy  . BREAST BIOPSY Left 2015   [facility]   . CERVICAL LAMINECTOMY     at C1 w/ duraplasty  . CRANIECTOMY SUBOCCIPITAL W/ CERVICAL LAMINECTOMY  / CHIARI  07/05/2011   At C1, performed at [facility]  . [facility]OMY  2015   [facility]     Current Medications: No outpatient medications have been marked as taking for the 06/06/19 encounter (Appointment) with Donato Heinz, MD.     Allergies:   Pollen extract, Citrus, Ibuprofen, and Other   Social History   Socioeconomic History  . Marital status: Married    Spouse name: Not on file  . Number of children: 1  . Years of education: Not on file  . Highest education level: Not on file  Occupational History  . Occupation: Pharmacist, hospital , high school  Tobacco Use  . Smoking status: Never Smoker  . Smokeless tobacco: Never Used  Substance and Sexual Activity  . Alcohol use: No  . Drug use: No  . Sexual activity: Not Currently    Partners: Male    Comment: 1st intercourse- 48, married- 50 yrs   Other Topics Concern  . Not on file  Social History Narrative   Original from [place]   Married x 70 years   1 daughter in [place]   Social Determinants of Health   Financial Resource Strain:   . Difficulty of Paying Living Expenses:   Food Insecurity:   . Worried About Charity fundraiser in  the Last Year:   . Ran Out of Food in the Last Year:   Transportation Needs:   . Freight forwarder (Medical):   Marland Kitchen Lack of Transportation (Non-Medical):   Physical Activity:   . Days of Exercise per Week:   . Minutes of Exercise per Session:   Stress:   . Feeling of Stress :   Social Connections:   . Frequency of Communication with Friends and Family:   . Frequency of Social Gatherings with Friends and Family:   . Attends Religious Services:   . Active Member of Clubs or Organizations:   . Attends Banker Meetings:   Marland Kitchen Marital Status:      Family History: The patient's family history includes Diabetes in her daughter; Hyperlipidemia in her sister; Hypertension in her sister; Stroke in her mother; Thyroid disease in her sister. There is no history of Colon cancer,  Sudden death, Heart attack, Breast cancer, or Stomach cancer.  ROS:   Please see the history of present illness.     All other systems reviewed and are negative.  EKGs/Labs/Other Studies Reviewed:    The following studies were reviewed today:   EKG:  EKG is ordered today.  The ekg ordered today demonstrates normal sinus rhythm, rate 68, no ST/T abnormalities  Cardiac monitor 03/24/19:  8 episodes of SVT, longest lasting 9 seconds.  There were 37 triggered events, which corresponded to sinus rhythm and 1 episode of SVT.   6 days of data recorded on Zio monitor. Patient had a min HR of 53 bpm, max HR of 167 bpm, and avg HR of 87 bpm. Predominant underlying rhythm was Sinus Rhythm. No VT, atrial fibrillation, high degree block, or pauses noted. 8 episodes of SVT, longest lasting 9 seconds.  Isolated atrial and ventricular ectopy was rare (<1%). There were 37 triggered events, which corresponded to sinus rhythm and 1 episode of SVT.      Recent Labs: 09/01/2018: TSH 2.11 01/08/2019: ALT 22; BUN 8; Creatinine, Ser 0.86; Hemoglobin 12.9; Platelets 310; Potassium 4.5; Sodium 140  Recent Lipid Panel    Component Value Date/Time   CHOL 183 10/11/2018 0716   TRIG 104.0 10/11/2018 0716   HDL 65.60 10/11/2018 0716   CHOLHDL 3 10/11/2018 0716   VLDL 20.8 10/11/2018 0716   LDLCALC 97 10/11/2018 0716   LDLCALC 87 09/01/2018 1432    Physical Exam:    VS:  There were no vitals taken for this visit.    Wt Readings from Last 3 Encounters:  04/25/19 181 lb (82.1 kg)  03/07/19 183 lb (83 kg)  02/26/19 181 lb (82.1 kg)     GEN:  Well nourished, well developed in no acute distress HEENT: Normal NECK: No JVD; No carotid bruits LYMPHATICS: No lymphadenopathy CARDIAC: RRR, no murmurs, rubs, gallops RESPIRATORY:  Clear to auscultation without rales, wheezing or rhonchi  ABDOMEN: Soft, non-tender, non-distended MUSCULOSKELETAL:  No edema; No deformity  SKIN: Warm and dry NEUROLOGIC:  Alert  and oriented x 3 PSYCHIATRIC:  Normal affect   ASSESSMENT:    No diagnosis found. PLAN:    In order of problems listed above:  Chest pain: Atypical in description, though can occur with exertionl.  Given risk factors (age, diabetes, hyperlipidemia), warrants further evaluation for obstructive CAD -Coronary CTA ordered, but was not done  Palpitations: Zio patch x6 days showed no significant abnormalities, brief episodes of SVT with longest lasting 9 seconds.  Hyperlipidemia: On atorvastatin 10 mg daily.  LDL 97 on  10/11/2018  Type 2 diabetes: On Metformin 500 mg 3 times daily.  A1c 6.7 on 01/16/2019.  RTC in ***  Medication Adjustments/Labs and Tests Ordered: Current medicines are reviewed at length with the patient today.  Concerns regarding medicines are outlined above.  No orders of the defined types were placed in this encounter.  No orders of the defined types were placed in this encounter.   There are no Patient Instructions on file for this visit.   Signed, Little Ishikawa, MD  06/03/2019 1:50 PM    [facility]

## 2019-06-06 ENCOUNTER — Ambulatory Visit: Payer: BC Managed Care – PPO | Admitting: Cardiology

## 2019-06-13 ENCOUNTER — Ambulatory Visit: Payer: BC Managed Care – PPO | Admitting: Family Medicine

## 2019-07-18 ENCOUNTER — Ambulatory Visit: Payer: BC Managed Care – PPO | Admitting: Internal Medicine

## 2019-07-18 ENCOUNTER — Other Ambulatory Visit: Payer: Self-pay

## 2019-07-18 ENCOUNTER — Encounter: Payer: Self-pay | Admitting: Internal Medicine

## 2019-07-18 VITALS — BP 120/82 | HR 85 | Ht 67.0 in | Wt 182.0 lb

## 2019-07-18 DIAGNOSIS — E559 Vitamin D deficiency, unspecified: Secondary | ICD-10-CM | POA: Diagnosis not present

## 2019-07-18 DIAGNOSIS — R748 Abnormal levels of other serum enzymes: Secondary | ICD-10-CM | POA: Diagnosis not present

## 2019-07-18 DIAGNOSIS — E210 Primary hyperparathyroidism: Secondary | ICD-10-CM | POA: Diagnosis not present

## 2019-07-18 DIAGNOSIS — E119 Type 2 diabetes mellitus without complications: Secondary | ICD-10-CM

## 2019-07-18 LAB — POCT GLYCOSYLATED HEMOGLOBIN (HGB A1C): Hemoglobin A1C: 6.8 % — AB (ref 4.0–5.6)

## 2019-07-18 LAB — VITAMIN B12: Vitamin B-12: 616 pg/mL (ref 211–911)

## 2019-07-18 MED ORDER — METFORMIN HCL ER 500 MG PO TB24: 500.0000 mg | ORAL_TABLET | Freq: Three times a day (TID) | ORAL | 3 refills | Status: DC

## 2019-07-18 NOTE — Patient Instructions (Addendum)
Please continue: -  Metformin ER 500 mg 3x a day.  Also, continue: - vitamin D 5000 units daily  Start: - 1 Tums a day with dinner  Please stop at the lab.  Please return in 4-6 months with your sugar log.

## 2019-07-18 NOTE — Progress Notes (Signed)
Patient ID: Abigail [name], female   DOB: 04/13/1950, 69 y.o.   MRN: [id number]  This visit occurred during the SARS-CoV-2 public health emergency.  Safety protocols were in place, including screening questions prior to the visit, additional usage of staff PPE, and extensive cleaning of exam room while observing appropriate contact time as indicated for disinfecting solutions.   HPI: Abigail [name] is a 69 y.o.-year-old female, returning for f/u for DM2, dx in ~2010, non-insulin-dependent, controlled, without complications and h/o primary HPTH.  She is the wife of Abigail [name], also my patient. Last visit 6 months ago.  DM2: Reviewed HbA1c levels: Lab Results  Component Value Date   HGBA1C 6.7 (A) 01/16/2019   HGBA1C 7.1 (A) 07/10/2018   HGBA1C 6.9 (A) 02/16/2018   Pt is on a regimen of: - Metformin ER 500 mg 2x >> 3x a day  Pt checks her sugars once a day per review of her log: - am:    112-148, 166 >> 88-156, 160 >> 80, 115-130, 140 - 2h after b'fast: 94-119, 145, 195 >> 106-115 >> 80-132, 167, 206 (banana) - before lunch: 120, 128, 167, 200 (no metformin) >> 99-119 >> n/c - 2h after lunch: 140s >> n/c >> 162 >> n/c >> 105, 106 >> n/c - before dinner:  114, 184 >> n/c >> 94 >> 103 >> 112, 181 >> n/c - 2h after dinner:  189 (ate late) >> n/c >> 97, 141 >> n/c - bedtime: n/c >> 132 >> n/c - nighttime: n/c Lowest sugar was 81 >> 93 >> 88 >> 80; she has hypoglycemia awareness in the 80s. Highest sugar was  195 >> 200 >> 181 >> 206.  Glucometer: ReliOn  Pt's meals are: - Breakfast: Fruit, eggs - Lunch: chicken/fish + veggies - no red meat  - Dinner: veggies + fruit +/- chicken/fish - Snacks: cashews, fruit (Mango)  No CKD, last BUN/creatinine:  Lab Results  Component Value Date   BUN 8 01/08/2019   CREATININE 0.86 01/08/2019   No HL; last set of lipids: Lab Results  Component Value Date   CHOL 183 10/11/2018   HDL 65.60 10/11/2018   LDLCALC 97  10/11/2018   TRIG 104.0 10/11/2018   CHOLHDL 3 10/11/2018  She is not on a statin. - last eye exam was in 2021: No DR - she will have a cataract surgery soon -She has numbness and tingling in her feet.  She has a history of surgery on her toe.  H/o primary hyperparathyroidism  - s/p 2x parathyroid glands resected in 2015, with resolution of her hypercalcemia -At last visit she was  taking a Tums tablet whenever she felt weak but I advised her to take this every day.  She will in the ED with paresthesia and pain in the right arm on 01/08/2019.  At that time, calcium was slightly low, at 8.7 (8.9-10.3).  It normalized afterwards.  Calcium level was normal at last check: Lab Results  Component Value Date   CALCIUM 8.9 01/16/2019   CALCIUM 8.7 (L) 01/08/2019   CALCIUM 10.3 07/25/2018   CALCIUM 9.2 07/20/2017   CALCIUM 9.1 07/21/2016   CALCIUM 8.8 02/20/2016   CALCIUM 9.1 12/22/2015   CALCIUM 9.2 07/09/2015   CALCIUM 9.2 01/02/2015   CALCIUM 10.4 05/03/2006   Vitamin D deficiency: Lab Results  Component Value Date   VD25OH 31.28 01/16/2019   VD25OH 35.44 02/16/2018   VD25OH 14.17 (L) 08/12/2017   She continues on 5000 units vitamin D   daily (but is out now - plans to re-stock and restart).  At last visit, she was on B12 2000 mcg daily but vitamin B12 returned high so we decreased the dose to only 1000 mcg daily.  However, she stopped this after last visit: Lab Results  Component Value Date   VITAMINB12 >1500 (H) 01/16/2019   VITAMINB12 >1500 (H) 07/20/2017   VITAMINB12 623 07/21/2016   VITAMINB12 394 07/09/2015   Stopped Gabapentin.  ROS: Constitutional: no weight gain/no weight loss, no fatigue, no subjective hyperthermia, no subjective hypothermia Eyes: no blurry vision, no xerophthalmia ENT: no sore throat, no nodules palpated in neck, no dysphagia, no odynophagia, no hoarseness Cardiovascular: no CP/no SOB/no palpitations/no leg swelling Respiratory: no cough/no  SOB/no wheezing Gastrointestinal: no N/no V/no D/no C/no acid reflux Musculoskeletal: no muscle aches/no joint aches, + pain in feet Skin: no rashes, no hair loss Neurological: no tremors/no numbness/no tingling/no dizziness  I reviewed pt's medications, allergies, PMH, social hx, family hx, and changes were documented in the history of present illness. Otherwise, unchanged from my initial visit note.   Past Medical History:  Diagnosis Date  . Asthma   . Chiari malformation type I ([place])   . Diabetes mellitus   . GERD (gastroesophageal reflux disease)   . Hx of gout   . Hyperlipidemia   . Hyperparathyroidism ([place])   . Hypertension   . Lumbar radiculopathy   . Postablative hypothyroidism   . Sickle cell anemia (H[facility])   . Spondylolysis   . Thyroid disease    Past Surgical History:  Procedure Laterality Date  . ABDOMINAL HYSTERECTOMY  1994   NO oophorectomy  . BREAST BIOPSY Left 2015   [facility]   . CERVICAL LAMINECTOMY     at C1 w/ duraplasty  . CRANIECTOMY SUBOCCIPITAL W/ CERVICAL LAMINECTOMY / CHIARI  07/05/2011   At C1, [facility]  . [facility]IDECTOMY  2015   [facility]    Social History   Social History  . Marital Status: Married    Spouse Name: N/A  . Number of Children: 1   Occupational History  . teacher - high school     Social History Main Topics  . Smoking status: Never Smoker   . Smokeless tobacco: Not on file  . Alcohol Use: No  . Drug Use: No   Social History Narrative   Original from [place]   Married x 44 years   Current Outpatient Medications on File Prior to Visit  Medication Sig Dispense Refill  . acetaminophen (TYLENOL) 500 MG tablet Take 1,000 mg by mouth every 6 (six) hours as needed.    Marland Kitchen albuterol (VENTOLIN HFA) 108 (90 Base) MCG/ACT inhaler Inhale 2 puffs into the lungs every 6 (six) hours as needed for wheezing or shortness of breath. 18 g 1  . aspirin EC 81 MG tablet Take 81 mg by mouth daily.    Marland Kitchen atorvastatin (LIPITOR) 10 MG tablet  Take 1 tablet (10 mg total) by mouth at bedtime. 30 tablet 3  . Blood Glucose Monitoring Suppl (ONE TOUCH ULTRA 2) w/Device KIT Use as advised 1 kit 0  . cyclobenzaprine (FLEXERIL) 10 MG tablet Take 1 tablet at bedtime. May also take additional 1/2 tablet up to twice daily. 60 tablet 3  . glucose blood (ONE TOUCH ULTRA TEST) test strip Reported on 07/09/2015    . metFORMIN (GLUCOPHAGE-XR) 500 MG 24 hr tablet Take 1 tablet (500 mg total) by mouth 3 (three) times daily with meals. 270 tablet 3  .  OVER THE COUNTER MEDICATION Health joint    . Potassium 99 MG TABS Take by mouth.    . nitroGLYCERIN (NITROSTAT) 0.4 MG SL tablet Place 1 tablet (0.4 mg total) under the tongue every 5 (five) minutes x 3 doses as needed for chest pain. (Patient not taking: Reported on 04/25/2019) 30 tablet 0   No current facility-administered medications on file prior to visit.   Allergies  Allergen Reactions  . Pollen Extract Itching  . Citrus Itching, Rash and Swelling  . Ibuprofen Swelling and Rash    Had a reaction to ibuprofen x1 but reports she is able to take aspirin  . Other Itching, Rash and Swelling    seafood. seafood   Family History  Problem Relation Age of Onset  . Hypertension Sister        sister, daughter , mother   . Hyperlipidemia Sister   . Thyroid disease Sister   . Diabetes Daughter   . Stroke Mother   . Colon cancer Neg Hx   . Sudden death Neg Hx   . Heart attack Neg Hx   . Breast cancer Neg Hx   . Stomach cancer Neg Hx    PE: BP 120/82   Pulse 85   Ht 5' 7" (1.702 m)   Wt 182 lb (82.6 kg)   SpO2 97%   BMI 28.51 kg/m  Wt Readings from Last 3 Encounters:  07/18/19 182 lb (82.6 kg)  04/25/19 181 lb (82.1 kg)  03/07/19 183 lb (83 kg)   Constitutional: overweight, in NAD Eyes: PERRLA, EOMI, no exophthalmos ENT: moist mucous membranes, no thyromegaly, no cervical lymphadenopathy Cardiovascular: RRR, No MRG Respiratory: CTA B Gastrointestinal: abdomen soft, NT, ND,  BS+ Musculoskeletal: no deformities, strength intact in all 4 Skin: moist, warm, no rashes Neurological: no tremor with outstretched hands, DTR normal in all 4  ASSESSMENT: 1. DM2, non-insulin-dependent, now more controlled, without complications  2. History of hyperparathyroidism -  status post 2 gland parathyroidectomy in 2015   3. Vit D def  4.  Elevated B12 vitamin  PLAN:  1. Patient with fairly well-controlled type 2 diabetes, on oral antidiabetic regimen with Metformin ER only.  She tolerates this well, without nausea or diarrhea.  She has been eating more during the coronavirus pandemic and sugars are higher, but before last visit sugars improved and they were mostly at goal, with only occasional spikes in the hyperglycemic range.  At that time, she gained weight after starting gabapentin and she also felt that her sugars are higher she was trying to come off the medication.  We did not change the regimen at that time. - at this visit, sugars are mostly at goal, with few exceptions especially after eating fruit.  I encouraged her to continue to eat fruit, and I explained that the benefit of eating these greatly outweighs the risk.  We do not need to change her Metformin at this time.  However, she is not tolerating well the Metformin ER that he obtained from [facility] (from [employer]) so I printed a prescription for her to take another pharmacy and see if she can obtain it from another manufacturer. - I suggested to:  Patient Instructions  Please continue: -  Metformin ER 500 mg 3x a day.  Also, continue: - vitamin D 5000 units daily  Start: - 1 Tums a day with dinner  Please stop at the lab.  Please return in 4-6 months with your sugar log.   - we  checked her HbA1c: 6.8% (slightly higher) - advised to check sugars at different times of the day - 1x a day, rotating check times - advised for yearly eye exams >> she is not UTD - return to clinic in 4-6 months  2.  History of primary hyperparathyroidism -She is status post parathyroidectomy -Before last visit she was in the ED with paresthesia and pain in the right arm.  Her calcium was minimally low, at 8.7 -At last visit, calcium was normal, at 8.9 (8.7-10.3), PTH was also normal at 26 (15-65), and vitamin D was low normal -In the past, she was taking calcium (Tums) when she felt weak and tired, but at last visit I advised her to take this daily.  However, she is not taking this consistently and I again advised him to start. -We will recheck her calcium at next visit  3. Vit D def -Vitamin D was low in the past and we started 5000 units vitamin D daily -She continues on this dose (but ran out) -At last visit, in 12/2018, her vitamin D level was normal, is 31.28 -Since she was out of her vitamin D level for a while, I advised her to restart it and will recheck at next visit  4.  Elevated B12 vitamin -At last visit, her B12 level was undetectably high -I advised her to decrease her B12 supplement only 1000 mcg daily.  However, she stopped it completely -We will recheck the level today to see if she needs to restart  Component     Latest Ref Rng & Units 07/18/2019  Vitamin B12     211 - 911 pg/mL 616   Vitamin B12 normal now.  We can continue off B12 supplements but will need to recheck at next visit.  Philemon Kingdom, MD PhD [facility] Endocrinology

## 2019-07-23 LAB — HM DIABETES EYE EXAM

## 2019-07-24 ENCOUNTER — Encounter: Payer: Self-pay | Admitting: Internal Medicine

## 2019-08-02 ENCOUNTER — Encounter: Payer: Self-pay | Admitting: Internal Medicine

## 2019-08-08 ENCOUNTER — Encounter: Payer: Self-pay | Admitting: Internal Medicine

## 2019-08-26 HISTORY — PX: CATARACT EXTRACTION: SUR2

## 2019-09-11 NOTE — Progress Notes (Addendum)
PATIENT: Abigail [name] DOB: Jun 29, 1950  REASON FOR VISIT: follow up HISTORY FROM: patient  Chief Complaint  Patient presents with  . Follow-up    3-4 month f/u. States she has been ok since last visit. Still having use to muscle relaxer.   . room 2    alone      HISTORY OF PRESENT ILLNESS: Today 09/12/19 Abigail [name] is a 69 y.o. female here today for follow up for headaches thought to be caused by chronic neck pain. She was advised to try cyclobenzaprine at bedtime and consider PT. PCP was willing to sign handicap plaqard but required that she continue with plans for PT, dry needling prior to signing in future. She feels that the cyclobenzaprine is helping. She continues to have pain. It is worse when she is cleaning. She reports that pin and needling sensation has resolved. She does note radiation of pain to the back of her head and down the right arm at times.   HISTORY: (copied from Dr Cathren Laine note on 03/07/2019)  HPI:  Abigail [name] is a 69 y.o. female here as requested by Larey Seat, MD for headaches. PMHx sickle cell anemia, thyroid disease, hypertension, hyperparathyroidism, hyperlipidemia, diabetes, asthma, Chiari malformation status post decompression June 2013 with recurrent symptoms.  My colleague Dr. Brett Fairy placed an order for MRI of the brain and cervical spine.  I reviewed MRI of the brain report, Chiari I malformation again noted post suboccipital craniectomy and C1 laminectomy in unchanged position of the cerebellar tonsils.  Otherwise unremarkable.  Normal cord signal no syrinx.She has a lot of pain in the occipital areas and radiating down the down the shoulders.  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 She denies any other pain.  Mostly in the back, does not radiate to the front of the head, she has  tried flexeril in 2012 and diclofenac, also tried gabapentin, metoprolol, tramadol, prednisone.    REVIEW OF SYSTEMS: Out of a complete 14 system review of symptoms, the patient complains only of the following symptoms, chronic neck pain and all other reviewed systems are negative.   ALLERGIES: Allergies  Allergen Reactions  . Pollen Extract Itching  . Citrus Itching, Rash and Swelling  . Ibuprofen Swelling and Rash    Had a reaction to ibuprofen x1 but reports she is able to take aspirin  . Other Itching, Rash and Swelling    seafood. seafood    HOME MEDICATIONS: Outpatient Medications Prior to Visit  Medication Sig Dispense Refill  . acetaminophen (TYLENOL) 500 MG tablet Take 1,000 mg by mouth every 6 (six) hours as needed.    Marland Kitchen albuterol (VENTOLIN HFA) 108 (90 Base) MCG/ACT inhaler Inhale 2 puffs into the lungs every 6 (six) hours as needed for wheezing or shortness of breath. 18 g 1  . aspirin EC 81 MG tablet Take 81 mg by mouth daily.    . Blood Glucose Monitoring Suppl (ONE TOUCH ULTRA 2) w/Device KIT Use as advised 1 kit 0  . cyclobenzaprine (FLEXERIL) 10 MG tablet Take 1 tablet at bedtime. May also take additional 1/2 tablet up to twice daily. 60 tablet 3  . glucose blood (ONE TOUCH ULTRA TEST) test strip Reported on 07/09/2015    . metFORMIN (GLUCOPHAGE-XR) 500 MG 24 hr tablet Take 1 tablet (500  mg total) by mouth 3 (three) times daily with meals. 270 tablet 3  . OVER THE COUNTER MEDICATION Health joint    . Potassium 99 MG TABS Take by mouth.    Marland Kitchen atorvastatin (LIPITOR) 10 MG tablet Take 1 tablet (10 mg total) by mouth at bedtime. 30 tablet 3  . nitroGLYCERIN (NITROSTAT) 0.4 MG SL tablet Place 1 tablet (0.4 mg total) under the tongue every 5 (five) minutes x 3 doses as needed for chest pain. 30 tablet 0   No facility-administered medications prior to visit.    PAST MEDICAL HISTORY: Past Medical History:  Diagnosis Date  . Asthma   . Chiari malformation type I ([place])     . Diabetes mellitus   . GERD (gastroesophageal reflux disease)   . Hx of gout   . Hyperlipidemia   . Hyperparathyroidism ([place])   . Hypertension   . Lumbar radiculopathy   . Postablative hypothyroidism   . Sickle cell anemia ([facility])   . Spondylolysis   . Thyroid disease     PAST SURGICAL HISTORY: Past Surgical History:  Procedure Laterality Date  . ABDOMINAL HYSTERECTOMY  1994   NO oophorectomy  . BREAST BIOPSY Left 2015   [facility]   . CERVICAL LAMINECTOMY     at C1 w/ duraplasty  . CRANIECTOMY SUBOCCIPITAL W/ CERVICAL LAMINECTOMY / CHIARI  07/05/2011   At C1, performed at [facility]  . PARATHYROIDECTOMY  2015   [facility]     FAMILY HISTORY: Family History  Problem Relation Age of Onset  . Hypertension Sister        sister, daughter , mother   . Hyperlipidemia Sister   . Thyroid disease Sister   . Diabetes Daughter   . Stroke Mother   . Colon cancer Neg Hx   . Sudden death Neg Hx   . Heart attack Neg Hx   . Breast cancer Neg Hx   . Stomach cancer Neg Hx     SOCIAL HISTORY: Social History   Socioeconomic History  . Marital status: Married    Spouse name: Not on file  . Number of children: 1  . Years of education: Not on file  . Highest education level: Not on file  Occupational History  . Occupation: Pharmacist, hospital , high school  Tobacco Use  . Smoking status: Never Smoker  . Smokeless tobacco: Never Used  Vaping Use  . Vaping Use: Never used  Substance and Sexual Activity  . Alcohol use: No  . Drug use: No  . Sexual activity: Not Currently    Partners: Male    Comment: 1st intercourse- 42, married- 60 yrs   Other Topics Concern  . Not on file  Social History Narrative   Original from [place]   Married x 33 years   1 daughter in [place]   Social Determinants of Health   Financial Resource Strain:   . Difficulty of Paying Living Expenses:   Food Insecurity:   . Worried About Charity fundraiser in the Last Year:   . Arboriculturist in the Last Year:    Transportation Needs:   . Film/video editor (Medical):   Marland Kitchen Lack of Transportation (Non-Medical):   Physical Activity:   . Days of Exercise per Week:   . Minutes of Exercise per Session:   Stress:   . Feeling of Stress :   Social Connections:   . Frequency of Communication with Friends and Family:   . Frequency of Social Gatherings with Friends  and Family:   . Attends Religious Services:   . Active Member of Clubs or Organizations:   . Attends Archivist Meetings:   Marland Kitchen Marital Status:   Intimate Partner Violence:   . Fear of Current or Ex-Partner:   . Emotionally Abused:   Marland Kitchen Physically Abused:   . Sexually Abused:       PHYSICAL EXAM  Vitals:   09/12/19 0736  BP: 123/69  Pulse: 83  Weight: 179 lb 3.2 oz (81.3 kg)  Height: 5' 7" (1.702 m)   Body mass index is 28.07 kg/m.  Generalized: Well developed, in no acute distress  Cardiology: normal rate and rhythm, no murmur noted Respiratory: clear to auscultation bilaterally  Neurological examination  Mentation: [name] oriented to time, place, history taking. Follows all commands speech and language fluent Cranial nerve II-XII: Pupils were equal round reactive to light. Extraocular movements were full, visual field were full on confrontational test. Facial sensation and strength were normal. Head turning and shoulder shrug  were normal and symmetric. Motor: The motor testing reveals 5 over 5 strength of all 4 extremities. Good symmetric motor tone is noted throughout.  Sensory: Patient reports increased sensation to light touch of right upper and lower extremity. No evidence of extinction is noted.   Gait and station: Gait is normal.  Reflexes: Deep tendon reflexes are symmetric and normal bilaterally.   DIAGNOSTIC DATA (LABS, IMAGING, TESTING) - I reviewed patient records, labs, notes, testing and imaging myself where available.  No flowsheet data found.   Lab Results  Component Value Date   WBC 5.4  01/08/2019   HGB 12.9 01/08/2019   HCT 39.5 01/08/2019   MCV 88.2 01/08/2019   PLT 310 01/08/2019      Component Value Date/Time   NA 140 01/08/2019 1138   NA 141 07/25/2018 1048   K 4.5 01/08/2019 1138   CL 102 01/08/2019 1138   CO2 27 01/08/2019 1138   GLUCOSE 128 (H) 01/08/2019 1138   BUN 8 01/08/2019 1138   BUN 17 07/25/2018 1048   CREATININE 0.86 01/08/2019 1138   CREATININE 0.94 02/20/2016 1656   CALCIUM 8.9 01/16/2019 1440   PROT 7.2 01/08/2019 1138   PROT 7.3 07/25/2018 1048   ALBUMIN 4.2 01/08/2019 1138   ALBUMIN 5.0 (H) 07/25/2018 1048   AST 23 01/08/2019 1138   ALT 22 01/08/2019 1138   ALKPHOS 92 01/08/2019 1138   BILITOT 0.5 01/08/2019 1138   BILITOT 0.3 07/25/2018 1048   GFRNONAA >60 01/08/2019 1138   GFRAA >60 01/08/2019 1138   Lab Results  Component Value Date   CHOL 183 10/11/2018   HDL 65.60 10/11/2018   LDLCALC 97 10/11/2018   TRIG 104.0 10/11/2018   CHOLHDL 3 10/11/2018   Lab Results  Component Value Date   HGBA1C 6.8 (A) 07/18/2019   Lab Results  Component Value Date   VITAMINB12 616 07/18/2019   Lab Results  Component Value Date   TSH 2.11 09/01/2018       ASSESSMENT AND PLAN 69 y.o. year old female  has a past medical history of Asthma, Chiari malformation type I ([place]), Diabetes mellitus, GERD (gastroesophageal reflux disease), gout, Hyperlipidemia, Hyperparathyroidism ([place]), Hypertension, Lumbar radiculopathy, Postablative hypothyroidism, Sickle cell anemia ([place]), Spondylolysis, and Thyroid disease. here with     ICD-10-CM   1. Cervical myofascial pain syndrome  M79.18     Ms. Lilia [name] reports some improvement in pain. Cyclobenzaprine helps. We will continue current therapy. I feel she would benefit  from PT for strength training and stretching as well as dry needling therapy. I have placed referral today. She was encouraged to continue working on healthy lifestyle habits. Regular physical exercise encouraged. Complementary therapies  discussed for pain management. She may continue Tylenol or Nsaids for pain relief as needed but was advised against regular use. She will follow up with me in 4 months. She verbalizes understanding and agreement with this plan.    No orders of the defined types were placed in this encounter.    No orders of the defined types were placed in this encounter.     I spent 20 minutes with the patient. 50% of this time was spent counseling and educating patient on plan of care and medications.    Debbora Presto, FNP-C 09/12/2019, 7:48 AM [facility] [address] [place], [place] [address] [phone]  Made any corrections needed, and agree with history, physical, neuro exam,assessment and plan as stated.     Sarina Ill, MD [facility]

## 2019-09-12 ENCOUNTER — Encounter: Payer: Self-pay | Admitting: Family Medicine

## 2019-09-12 ENCOUNTER — Ambulatory Visit: Payer: Medicare Other | Admitting: Family Medicine

## 2019-09-12 VITALS — BP 123/69 | HR 83 | Ht 67.0 in | Wt 179.2 lb

## 2019-09-12 DIAGNOSIS — M7918 Myalgia, other site: Secondary | ICD-10-CM

## 2019-09-12 NOTE — Patient Instructions (Signed)
We will continue cyclobenzaprine as needed at night. Please continue Tylenol as needed but avoid regular use if possible. Ibuprofen or Aleve may help better with inflammation. You can take as directed on packaging. Avoid regular use, only take as needed.    We will refer you to physical therapy.    Follow up with me in 4 months    Cervical Radiculopathy  Cervical radiculopathy means that a nerve in the neck (a cervical nerve) is pinched or bruised. This can happen because of an injury to the cervical spine (vertebrae) in the neck, or as a normal part of getting older. This can cause pain or loss of feeling (numbness) that runs from your neck all the way down to your arm and fingers. Often, this condition gets better with rest. Treatment may be needed if the condition does not get better. What are the causes?  A neck injury.  A bulging disk in your spine.  Muscle movements that you cannot control (muscle spasms).  Tight muscles in your neck due to overuse.  Arthritis.  Breakdown in the bones and joints of the spine (spondylosis) due to getting older.  Bone spurs that form near the nerves in the neck. What are the signs or symptoms?  Pain. The pain may: ? Run from the neck to the arm and hand. ? Be very bad or irritating. ? Be worse when you move your neck.  Loss of feeling or tingling in your arm or hand.  Weakness in your arm or hand, in very bad cases. How is this treated? In many cases, treatment is not needed for this condition. With rest, the condition often gets better over time. If treatment is needed, options may include:  Wearing a soft neck collar (cervical collar) for short periods of time, as told by your doctor.  Doing exercises (physical therapy) to strengthen your neck muscles.  Taking medicines.  Having shots (injections) in your spine, in very bad cases.  Having surgery. This may be needed if other treatments do not help. The type of surgery that is  used depends on the cause of your condition. Follow these instructions at home: If you have a soft neck collar:  Wear it as told by your doctor. Remove it only as told by your doctor.  Ask your doctor if you can remove the collar for cleaning and bathing. If you are allowed to remove the collar for cleaning or bathing: ? Follow instructions from your doctor about how to remove the collar safely. ? Clean the collar by wiping it with mild soap and water and drying it completely. ? Take out any removable pads in the collar every 1-2 days. Wash them by hand with soap and water. Let them air-dry completely before you put them back in the collar. ? Check your skin under the collar for redness or sores. If you see any, tell your doctor. Managing pain      Take over-the-counter and prescription medicines only as told by your doctor.  If told, put ice on the painful area. ? If you have a soft neck collar, remove it as told by your doctor. ? Put ice in a plastic bag. ? Place a towel between your skin and the bag. ? Leave the ice on for 20 minutes, 2-3 times a day.  If using ice does not help, you can try using heat. Use the heat source that your doctor recommends, such as a moist heat pack or a heating pad. ?  Place a towel between your skin and the heat source. ? Leave the heat on for 20-30 minutes. ? Remove the heat if your skin turns bright red. This is very important if you are unable to feel pain, heat, or cold. You may have a greater risk of getting burned.  You may try a gentle neck and shoulder rub (massage). Activity  Rest as needed.  Return to your normal activities as told by your doctor. Ask your doctor what activities are safe for you.  Do exercises as told by your doctor or physical therapist.  Do not lift anything that is heavier than 10 lb (4.5 kg) until your doctor tells you that it is safe. General instructions  Use a flat pillow when you sleep.  Do not drive while  wearing a soft neck collar. If you do not have a soft neck collar, ask your doctor if it is safe to drive while your neck heals.  Ask your doctor if the medicine prescribed to you requires you to avoid driving or using heavy machinery.  Do not use any products that contain nicotine or tobacco, such as cigarettes, e-cigarettes, and chewing tobacco. These can delay healing. If you need help quitting, ask your doctor.  Keep all follow-up visits as told by your doctor. This is important. Contact a doctor if:  Your condition does not get better with treatment. Get help right away if:  Your pain gets worse and is not helped with medicine.  You lose feeling or feel weak in your hand, arm, face, or leg.  You have a high fever.  You have a stiff neck.  You cannot control when you poop or pee (have incontinence).  You have trouble with walking, balance, or talking. Summary  Cervical radiculopathy means that a nerve in the neck is pinched or bruised.  A nerve can get pinched from a bulging disk, arthritis, an injury to the neck, or other causes.  Symptoms include pain, tingling, or loss of feeling that goes from the neck into the arm or hand.  Weakness in your arm or hand can happen in very bad cases.  Treatment may include resting, wearing a soft neck collar, and doing exercises. You might need to take medicines for pain. In very bad cases, shots or surgery may be needed. This information is not intended to replace advice given to you by your health care provider. Make sure you discuss any questions you have with your health care provider. Document Revised: 12/02/2017 Document Reviewed: 12/02/2017 Elsevier Patient Education  2020 Elsevier Inc.   Musculoskeletal Pain Musculoskeletal pain refers to aches and pains in your bones, joints, muscles, and the tissues that surround them. This pain can occur in any part of the body. It can last for a short time (acute) or a long time  (chronic). A physical exam, lab tests, and imaging studies may be done to find the cause of your musculoskeletal pain. Follow these instructions at home:  Lifestyle  Try to control or lower your stress levels. Stress increases muscle tension and can worsen musculoskeletal pain. It is important to recognize when you are anxious or stressed and learn ways to manage it. This may include: ? Meditation or yoga. ? Cognitive or behavioral therapy. ? Acupuncture or massage therapy.  You may continue all activities unless the activities cause more pain. When the pain gets better, slowly resume your normal activities. Gradually increase the intensity and duration of your activities or exercise. Managing pain, stiffness, and  swelling  Take over-the-counter and prescription medicines only as told by your health care provider.  When your pain is severe, bed rest may be helpful. Lie or sit in any position that is comfortable, but get out of bed and walk around at least every couple of hours.  If directed, apply heat to the affected area as often as told by your health care provider. Use the heat source that your health care provider recommends, such as a moist heat pack or a heating pad. ? Place a towel between your skin and the heat source. ? Leave the heat on for 20-30 minutes. ? Remove the heat if your skin turns bright red. This is especially important if you are unable to feel pain, heat, or cold. You may have a greater risk of getting burned.  If directed, put ice on the painful area. ? Put ice in a plastic bag. ? Place a towel between your skin and the bag. ? Leave the ice on for 20 minutes, 2-3 times a day. General instructions  Your health care provider may recommend that you see a physical therapist. This person can help you come up with a safe exercise program. Do any exercises as told by your physical therapist.  Keep all follow-up visits, including any physical therapy visits, as told  by your health care providers. This is important. Contact a health care provider if:  Your pain gets worse.  Medicines do not help ease your pain.  You cannot use the part of your body that hurts, such as your arm, leg, or neck.  You have trouble sleeping.  You have trouble doing your normal activities. Get help right away if:  You have a new injury and your pain is worse or different.  You feel numb or you have tingling in the painful area. Summary  Musculoskeletal pain refers to aches and pains in your bones, joints, muscles, and the tissues that surround them.  This pain can occur in any part of the body.  Your health care provider may recommend that you see a physical therapist. This person can help you come up with a safe exercise program. Do any exercises as told by your physical therapist.  Lower your stress level. Stress can worsen musculoskeletal pain. Ways to lower stress may include meditation, yoga, cognitive or behavioral therapy, acupuncture, and massage therapy. This information is not intended to replace advice given to you by your health care provider. Make sure you discuss any questions you have with your health care provider. Document Revised: 12/24/2016 Document Reviewed: 02/11/2016 Elsevier Patient Education  2020 [employer].

## 2019-09-26 DIAGNOSIS — H25012 Cortical age-related cataract, left eye: Secondary | ICD-10-CM | POA: Diagnosis not present

## 2019-09-26 DIAGNOSIS — H2512 Age-related nuclear cataract, left eye: Secondary | ICD-10-CM | POA: Diagnosis not present

## 2019-09-26 LAB — HM DIABETES EYE EXAM

## 2019-09-27 DIAGNOSIS — B309 Viral conjunctivitis, unspecified: Secondary | ICD-10-CM | POA: Diagnosis not present

## 2019-10-03 DIAGNOSIS — B309 Viral conjunctivitis, unspecified: Secondary | ICD-10-CM | POA: Diagnosis not present

## 2019-10-03 DIAGNOSIS — E113393 Type 2 diabetes mellitus with moderate nonproliferative diabetic retinopathy without macular edema, bilateral: Secondary | ICD-10-CM | POA: Diagnosis not present

## 2019-10-09 ENCOUNTER — Encounter (INDEPENDENT_AMBULATORY_CARE_PROVIDER_SITE_OTHER): Payer: Medicare Other | Admitting: Ophthalmology

## 2019-10-09 ENCOUNTER — Other Ambulatory Visit: Payer: Self-pay

## 2019-10-09 DIAGNOSIS — E11319 Type 2 diabetes mellitus with unspecified diabetic retinopathy without macular edema: Secondary | ICD-10-CM

## 2019-10-09 DIAGNOSIS — H43813 Vitreous degeneration, bilateral: Secondary | ICD-10-CM

## 2019-10-09 DIAGNOSIS — E113393 Type 2 diabetes mellitus with moderate nonproliferative diabetic retinopathy without macular edema, bilateral: Secondary | ICD-10-CM | POA: Diagnosis not present

## 2019-10-09 DIAGNOSIS — H33302 Unspecified retinal break, left eye: Secondary | ICD-10-CM | POA: Diagnosis not present

## 2019-10-12 ENCOUNTER — Telehealth: Payer: Self-pay | Admitting: Internal Medicine

## 2019-10-12 NOTE — Telephone Encounter (Signed)
Pt needs metformin called into [facility]. She said they need a new prescription sent to them.

## 2019-10-15 ENCOUNTER — Other Ambulatory Visit (HOSPITAL_BASED_OUTPATIENT_CLINIC_OR_DEPARTMENT_OTHER): Payer: Self-pay | Admitting: Internal Medicine

## 2019-10-15 DIAGNOSIS — Z1231 Encounter for screening mammogram for malignant neoplasm of breast: Secondary | ICD-10-CM

## 2019-10-15 MED FILL — KETOROLAC 0.5% OPHTH SOLUTI: 0.5 | 30 days supply | Qty: 10 | Fill #0

## 2019-10-16 ENCOUNTER — Ambulatory Visit (INDEPENDENT_AMBULATORY_CARE_PROVIDER_SITE_OTHER): Payer: Medicare Other | Admitting: Internal Medicine

## 2019-10-16 ENCOUNTER — Other Ambulatory Visit: Payer: Self-pay

## 2019-10-16 ENCOUNTER — Encounter: Payer: Self-pay | Admitting: Internal Medicine

## 2019-10-16 VITALS — BP 121/77 | HR 79 | Temp 98.1°F | Resp 18 | Ht 67.0 in | Wt 179.5 lb

## 2019-10-16 DIAGNOSIS — E119 Type 2 diabetes mellitus without complications: Secondary | ICD-10-CM

## 2019-10-16 DIAGNOSIS — E785 Hyperlipidemia, unspecified: Secondary | ICD-10-CM

## 2019-10-16 DIAGNOSIS — Z0000 Encounter for general adult medical examination without abnormal findings: Secondary | ICD-10-CM | POA: Diagnosis not present

## 2019-10-16 DIAGNOSIS — E079 Disorder of thyroid, unspecified: Secondary | ICD-10-CM | POA: Diagnosis not present

## 2019-10-16 DIAGNOSIS — Z23 Encounter for immunization: Secondary | ICD-10-CM | POA: Diagnosis not present

## 2019-10-16 DIAGNOSIS — E559 Vitamin D deficiency, unspecified: Secondary | ICD-10-CM

## 2019-10-16 NOTE — Progress Notes (Signed)
Pre visit review using our clinic review tool, if applicable. No additional management support is needed unless otherwise documented below in the visit note. 

## 2019-10-16 NOTE — Patient Instructions (Signed)
    GO TO THE FRONT DESK, PLEASE SCHEDULE YOUR APPOINTMENTS Schedule labs to be done fasting this week  Come back for a physical exam in 1 year

## 2019-10-16 NOTE — Assessment & Plan Note (Signed)
Here for CPX DM: Plans to see Dr. Elvera Lennox Dyslipidemia: Rx atorvastatin 09/01/2018 due to history of DM, apparently patient has not been taking statins. Thyroid disease, history of ablation: Check TSH Neck pain, right-sided pain: Since the last visit reports she is doing better, on Flexeril as needed, states that she has "learned to live with it" and avoids activities that trigger the pain. Vitamin D deficiency: on OTCs , 5000 units qd. Checking labs RTC 1 year.
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
## 2019-10-16 NOTE — Assessment & Plan Note (Addendum)
--   Td 04-2015 - pnm 23: 2016 - prevnar: 2019 - S/p shingrix - s/p C-19 vaccines   -  flu shot today -- Female care:  saw gyn 08-2015, PAP-HPV (-), per note no further PAPs, to see gyn 11-08-19 [facility]: schedule for tomorrow --CCS: 07-2012 , Dr Norma Fredrickson, per report no polyps, next cscope 10 years, was rec to start stool testing 2019. iFOB (-) last year, rx iFOB again --T score -0.9 (04-2015) --Labs: CMP, FLP, CBC, vitamin D, TSH --Diet and exercise discussed.

## 2019-10-17 ENCOUNTER — Other Ambulatory Visit (INDEPENDENT_AMBULATORY_CARE_PROVIDER_SITE_OTHER): Payer: Medicare Other

## 2019-10-17 ENCOUNTER — Ambulatory Visit (HOSPITAL_BASED_OUTPATIENT_CLINIC_OR_DEPARTMENT_OTHER)
Admission: RE | Admit: 2019-10-17 | Discharge: 2019-10-17 | Disposition: A | Discharge status: Home / Self Care | Payer: Medicare Other | Source: Ambulatory Visit | Attending: Internal Medicine | Admitting: Internal Medicine

## 2019-10-17 DIAGNOSIS — E079 Disorder of thyroid, unspecified: Secondary | ICD-10-CM

## 2019-10-17 DIAGNOSIS — E785 Hyperlipidemia, unspecified: Secondary | ICD-10-CM | POA: Diagnosis not present

## 2019-10-17 DIAGNOSIS — Z1231 Encounter for screening mammogram for malignant neoplasm of breast: Secondary | ICD-10-CM

## 2019-10-17 DIAGNOSIS — E559 Vitamin D deficiency, unspecified: Secondary | ICD-10-CM

## 2019-10-17 DIAGNOSIS — Z0000 Encounter for general adult medical examination without abnormal findings: Secondary | ICD-10-CM

## 2019-10-18 LAB — LIPID PANEL
Cholesterol: 178 mg/dL (ref <200)
HDL: 68 mg/dL (ref >=50)
LDL Cholesterol (Calc): 95 mg/dL (calc)
Non-HDL Cholesterol (Calc): 110 mg/dL (calc) (ref <130)
Total CHOL/HDL Ratio: 2.6 (calc) (ref <5.0)
Triglycerides: 63 mg/dL (ref <150)

## 2019-10-18 LAB — COMPREHENSIVE METABOLIC PANEL
AG Ratio: 1.7 (calc) (ref 1.0–2.5)
ALT: 12 U/L (ref 6–29)
AST: 17 U/L (ref 10–35)
Albumin: 4.2 g/dL (ref 3.6–5.1)
Alkaline phosphatase (APISO): 85 U/L (ref 37–153)
BUN: 14 mg/dL (ref 7–25)
CO2: 28 mmol/L (ref 20–32)
Calcium: 9 mg/dL (ref 8.6–10.4)
Chloride: 103 mmol/L (ref 98–110)
Creat: 0.93 mg/dL (ref 0.50–0.99)
Globulin: 2.5 g/dL (calc) (ref 1.9–3.7)
Glucose, Bld: 131 mg/dL — ABNORMAL HIGH (ref 65–99)
Potassium: 4.2 mmol/L (ref 3.5–5.3)
Sodium: 139 mmol/L (ref 135–146)
Total Bilirubin: 0.6 mg/dL (ref 0.2–1.2)
Total Protein: 6.7 g/dL (ref 6.1–8.1)

## 2019-10-18 LAB — CBC WITH DIFFERENTIAL/PLATELET
Absolute Monocytes: 442 cells/uL (ref 200–950)
Basophils Absolute: 41 cells/uL (ref 0–200)
Basophils Relative: 0.9 %
Eosinophils Absolute: 202 cells/uL (ref 15–500)
Eosinophils Relative: 4.4 %
HCT: 38 % (ref 35.0–45.0)
Hemoglobin: 12.3 g/dL (ref 11.7–15.5)
Lymphs Abs: 1026 cells/uL (ref 850–3900)
MCH: 28.5 pg (ref 27.0–33.0)
MCHC: 32.4 g/dL (ref 32.0–36.0)
MCV: 88.2 fL (ref 80.0–100.0)
MPV: 10.3 fL (ref 7.5–12.5)
Monocytes Relative: 9.6 %
Neutro Abs: 2889 cells/uL (ref 1500–7800)
Neutrophils Relative %: 62.8 %
Platelets: 272 10*3/uL (ref 140–400)
RBC: 4.31 10*6/uL (ref 3.80–5.10)
RDW: 13.5 % (ref 11.0–15.0)
Total Lymphocyte: 22.3 %
WBC: 4.6 10*3/uL (ref 3.8–10.8)

## 2019-10-18 LAB — TSH: TSH: 5.49 mIU/L — ABNORMAL HIGH (ref 0.40–4.50)

## 2019-10-18 LAB — FECAL OCCULT BLOOD, IMMUNOCHEMICAL: Fecal Occult Bld: NEGATIVE

## 2019-10-18 LAB — VITAMIN D 25 HYDROXY (VIT D DEFICIENCY, FRACTURES): Vit D, 25-Hydroxy: 40 ng/mL (ref 30–100)

## 2019-10-19 ENCOUNTER — Other Ambulatory Visit: Payer: Self-pay | Admitting: *Deleted

## 2019-10-19 DIAGNOSIS — E119 Type 2 diabetes mellitus without complications: Secondary | ICD-10-CM

## 2019-10-19 MED ORDER — METFORMIN HCL ER 500 MG PO TB24: 500.0000 mg | ORAL_TABLET | Freq: Three times a day (TID) | ORAL | 3 refills | Status: DC

## 2019-10-19 NOTE — Telephone Encounter (Signed)
Refilled Metformin sent to [facility].

## 2019-10-22 NOTE — Addendum Note (Signed)
Addended byConrad Whitesville D on: 10/22/2019 11:52 AM   Modules accepted: Orders

## 2019-10-23 DIAGNOSIS — H25012 Cortical age-related cataract, left eye: Secondary | ICD-10-CM | POA: Diagnosis not present

## 2019-10-23 DIAGNOSIS — H2512 Age-related nuclear cataract, left eye: Secondary | ICD-10-CM | POA: Diagnosis not present

## 2019-10-23 DIAGNOSIS — H25812 Combined forms of age-related cataract, left eye: Secondary | ICD-10-CM | POA: Diagnosis not present

## 2019-10-26 ENCOUNTER — Telehealth: Payer: Self-pay | Admitting: Internal Medicine

## 2019-10-26 ENCOUNTER — Encounter: Payer: Self-pay | Admitting: Internal Medicine

## 2019-10-26 ENCOUNTER — Other Ambulatory Visit (HOSPITAL_BASED_OUTPATIENT_CLINIC_OR_DEPARTMENT_OTHER): Payer: Self-pay | Admitting: Internal Medicine

## 2019-10-26 DIAGNOSIS — E119 Type 2 diabetes mellitus without complications: Secondary | ICD-10-CM

## 2019-10-26 MED ORDER — METFORMIN HCL ER 500 MG PO TB24: 500.0000 mg | ORAL_TABLET | Freq: Three times a day (TID) | ORAL | 3 refills | Status: DC

## 2019-10-26 MED FILL — metFORMIN HCL ER 500 MG TB2: 500 | 90 days supply | Qty: 270 | Fill #0

## 2019-10-26 NOTE — Telephone Encounter (Signed)
I sent her a mychart message about this

## 2019-10-26 NOTE — Telephone Encounter (Signed)
Patient called stating she had labs done at her PCP and they informed her that her TSH was high and needed to let Dr Elvera Lennox know.   She also needed her Rx for metformin called in - it looks like its on her med list but it says "[facility] [place], [place] - [address] Phone:  [phone]  Fax:  [phone]     She also needs a new meter, her new insurance does not cover the One Touch anymore, so she's giving them a call to see what is covered instead and calling us back.

## 2019-10-26 NOTE — Telephone Encounter (Signed)
Refill sent.

## 2019-10-29 ENCOUNTER — Encounter: Payer: Self-pay | Admitting: Internal Medicine

## 2019-10-29 LAB — HM DIABETES EYE EXAM

## 2019-11-02 ENCOUNTER — Other Ambulatory Visit: Payer: Self-pay | Admitting: Internal Medicine

## 2019-11-02 ENCOUNTER — Other Ambulatory Visit (HOSPITAL_BASED_OUTPATIENT_CLINIC_OR_DEPARTMENT_OTHER): Payer: Self-pay | Admitting: Internal Medicine

## 2019-11-02 MED ORDER — ONETOUCH VERIO VI STRP: ORAL_STRIP | 12 refills | Status: DC

## 2019-11-02 MED ORDER — ONETOUCH VERIO W/DEVICE KIT: PACK | 0 refills | Status: DC

## 2019-11-02 MED ORDER — ONETOUCH ULTRASOFT LANCETS MISC: 12 refills | Status: DC

## 2019-11-02 MED FILL — ONETOUCH VERIO METER SYSTEM: W/DEVICE | 1 days supply | Qty: 1 | Fill #0

## 2019-11-02 MED FILL — ONETOUCH VERIO TEST STRIP: 50 days supply | Qty: 200 | Fill #0

## 2019-11-02 MED FILL — ONE TOUCH ULTRASOFT LANCETS: 50 days supply | Qty: 200 | Fill #0

## 2019-11-02 NOTE — Telephone Encounter (Signed)
Patient called stating she needs either a One Touch or Accu-chek kit and all supplies called into her pharmacy (if possible today due to it being the weekend) - she said it does not matter which one, her insurance covers both. She wanted to see if Dr G thought one would be better than the other.  Pharmacy - [facility] [place] [facility] - [place], [place] - [address]  [address], [place] [address]  Phone:  [phone] Fax:  [phone]  

## 2019-11-08 ENCOUNTER — Encounter: Payer: Self-pay | Admitting: Obstetrics & Gynecology

## 2019-11-08 ENCOUNTER — Ambulatory Visit (INDEPENDENT_AMBULATORY_CARE_PROVIDER_SITE_OTHER): Payer: Medicare Other | Admitting: Obstetrics & Gynecology

## 2019-11-08 ENCOUNTER — Other Ambulatory Visit: Payer: Self-pay

## 2019-11-08 VITALS — BP 140/84 | Ht 66.0 in | Wt 179.0 lb

## 2019-11-08 DIAGNOSIS — Z90710 Acquired absence of both cervix and uterus: Secondary | ICD-10-CM | POA: Diagnosis not present

## 2019-11-08 DIAGNOSIS — Z01419 Encounter for gynecological examination (general) (routine) without abnormal findings: Secondary | ICD-10-CM | POA: Diagnosis not present

## 2019-11-08 DIAGNOSIS — F32A Depression, unspecified: Secondary | ICD-10-CM | POA: Diagnosis not present

## 2019-11-08 DIAGNOSIS — R5383 Other fatigue: Secondary | ICD-10-CM

## 2019-11-08 DIAGNOSIS — Z124 Encounter for screening for malignant neoplasm of cervix: Secondary | ICD-10-CM | POA: Diagnosis not present

## 2019-11-08 DIAGNOSIS — Z780 Asymptomatic menopausal state: Secondary | ICD-10-CM

## 2019-11-08 DIAGNOSIS — E663 Overweight: Secondary | ICD-10-CM

## 2019-11-08 DIAGNOSIS — Z1272 Encounter for screening for malignant neoplasm of vagina: Secondary | ICD-10-CM

## 2019-11-08 NOTE — Progress Notes (Signed)
Abigail [name] 02/26/1950 [id number]   History:    69 y.o.  G1P1L1 Husband with Alzheimer living in a Care Facility.  RP:  Established patient presenting for annual gyn exam   HPI: S/P TAH. Menopause, well on no hormone replacement therapy.  No pelvic pain. No vaginal discharge. Abstinent. Urine and bowel movements normal. Breasts normal. Body mass index 28.89. C/O Hair loss and fatigue.  Health labs with family physician.  Past medical history,surgical history, family history and social history were all reviewed and documented in the EPIC chart.  Gynecologic History No LMP recorded. Patient has had a hysterectomy.  Obstetric History OB History  Gravida Para Term Preterm AB Living  1 1       1   SAB TAB Ectopic Multiple Live Births          1    # Outcome Date GA Lbr Len/2nd Weight Sex Delivery Anes PTL Lv  1 Para     F Vag-Spont        ROS: A ROS was performed and pertinent positives and negatives are included in the history.  GENERAL: No fevers or chills. HEENT: No change in vision, no earache, sore throat or sinus congestion. NECK: No pain or stiffness. CARDIOVASCULAR: No chest pain or pressure. No palpitations. PULMONARY: No shortness of breath, cough or wheeze. GASTROINTESTINAL: No abdominal pain, nausea, vomiting or diarrhea, melena or bright red blood per rectum. GENITOURINARY: No urinary frequency, urgency, hesitancy or dysuria. MUSCULOSKELETAL: No joint or muscle pain, no back pain, no recent trauma. DERMATOLOGIC: No rash, no itching, no lesions. ENDOCRINE: No polyuria, polydipsia, no heat or cold intolerance. No recent change in weight. HEMATOLOGICAL: No anemia or easy bruising or bleeding. NEUROLOGIC: No headache, seizures, numbness, tingling or weakness. PSYCHIATRIC: No depression, no loss of interest in normal activity or change in sleep pattern.     Exam:   BP 140/84   Ht 5\' 6"  (1.676 m)   Wt 179 lb (81.2 kg)   BMI 28.89 kg/m   Body mass index  is 28.89 kg/m.  General appearance : Well developed well nourished female. No acute distress HEENT: Eyes: no retinal hemorrhage or exudates,  Neck supple, trachea midline, no carotid bruits, no thyroidmegaly Lungs: Clear to auscultation, no rhonchi or wheezes, or rib retractions  Heart: Regular rate and rhythm, no murmurs or gallops Breast:Examined in sitting and supine position were symmetrical in appearance, no palpable masses or tenderness,  no skin retraction, no nipple inversion, no nipple discharge, no skin discoloration, no axillary or supraclavicular lymphadenopathy Abdomen: no palpable masses or tenderness, no rebound or guarding Extremities: no edema or skin discoloration or tenderness  Pelvic: Vulva: Normal             Vagina: No gross lesions or discharge.  Pap reflex done.  Cervix/uterus absent  Adnexa  Without masses or tenderness  Anus: Normal   Assessment/Plan:  69 y.o. female for annual exam   1. Encounter for Papanicolaou smear of vagina as part of routine gynecological examination Normal gynecologic exam in menopause.  Pap reflex test done this year.  Breast exam normal.  Screening mammogram September 2021 was negative.  Colonoscopy 2014.  Health labs with family physician.  2. Screening for malignant neoplasm of cervix Pap reflex done today.  3. S/P TAH (total abdominal hysterectomy)  4. Postmenopausal Well on no hormone replacement therapy.  Bone density 2017 was normal.  Vitamin D supplements, calcium intake of 1500 mg daily and regular weightbearing physical  activities recommended.  Repeat bone density in 2022.  5.  Fatigue due to depression Fatigue with hair loss.  No suicidal ideation.  TSH high in September 2021.  Probable hypothyroidism.  Needs follow-up with family physician.  6. Overweight (BMI 25.0-29.9) Recommend a lower calorie/carb diet.  Aerobic activities 5 times a week and light weightlifting every 2 days.  Treat hypothyroidism as  needed.  Genia Del MD, 1:43 PM 11/08/2019

## 2019-11-09 ENCOUNTER — Ambulatory Visit: Payer: Medicare Other | Attending: Internal Medicine

## 2019-11-09 ENCOUNTER — Other Ambulatory Visit (HOSPITAL_BASED_OUTPATIENT_CLINIC_OR_DEPARTMENT_OTHER): Payer: Self-pay | Admitting: Internal Medicine

## 2019-11-09 DIAGNOSIS — Z23 Encounter for immunization: Secondary | ICD-10-CM

## 2019-11-09 NOTE — Progress Notes (Signed)
° °  Covid-19 Vaccination Clinic  Name:  Abigail [name]    MRN: [id number] DOB: [birth year]  11/09/2019  Abigail [name] was observed post Covid-19 immunization for 15 minutes without incident. She was provided with Vaccine Information Sheet and instruction to access the V-Safe system. Vaccinated by [employer].  Abigail [name] was instructed to call 911 with any severe reactions post vaccine:  Difficulty breathing   Swelling of face and throat   A fast heartbeat   A bad rash all over body   Dizziness and weakness

## 2019-11-11 ENCOUNTER — Encounter: Payer: Self-pay | Admitting: Obstetrics & Gynecology

## 2019-11-19 MED FILL — PFIZER-BIONTECH COVID-19 VA: 30 | 1 days supply | Qty: 0 | Fill #0

## 2019-11-22 ENCOUNTER — Telehealth: Payer: Self-pay | Admitting: Internal Medicine

## 2019-11-22 DIAGNOSIS — Z0279 Encounter for issue of other medical certificate: Secondary | ICD-10-CM

## 2019-11-22 NOTE — Telephone Encounter (Signed)
Patient states she need a to re-new her handicap parking placard. Document place at providers tray. Please call pt when ready

## 2019-11-22 NOTE — Telephone Encounter (Signed)
Form completed, placed in PCP red folder, awaiting PCP signature

## 2019-11-23 NOTE — Telephone Encounter (Signed)
Spoke w/ Pt- informed form is ready for pick up at her convenience. Copy sent for scanning.

## 2019-12-03 ENCOUNTER — Encounter: Payer: Self-pay | Admitting: Internal Medicine

## 2019-12-04 ENCOUNTER — Other Ambulatory Visit: Payer: Medicare Other

## 2019-12-04 ENCOUNTER — Other Ambulatory Visit: Payer: Self-pay

## 2019-12-04 ENCOUNTER — Other Ambulatory Visit (HOSPITAL_BASED_OUTPATIENT_CLINIC_OR_DEPARTMENT_OTHER): Payer: Self-pay | Admitting: Surgery

## 2019-12-04 ENCOUNTER — Encounter: Payer: Self-pay | Admitting: Internal Medicine

## 2019-12-04 ENCOUNTER — Other Ambulatory Visit (INDEPENDENT_AMBULATORY_CARE_PROVIDER_SITE_OTHER): Payer: Medicare Other

## 2019-12-04 DIAGNOSIS — E210 Primary hyperparathyroidism: Secondary | ICD-10-CM

## 2019-12-04 DIAGNOSIS — H04211 Epiphora due to excess lacrimation, right lacrimal gland: Secondary | ICD-10-CM | POA: Diagnosis not present

## 2019-12-04 LAB — HM DIABETES EYE EXAM

## 2019-12-04 LAB — TSH: TSH: 4.42 u[IU]/mL (ref 0.35–4.50)

## 2019-12-04 LAB — T4, FREE: Free T4: 0.72 ng/dL (ref 0.60–1.60)

## 2019-12-04 MED FILL — NEO/POLYMYXIN/DEXAMETH DROP: 3.5-10000-0 | 25 days supply | Qty: 5 | Fill #0

## 2019-12-04 MED FILL — NEO/POLY/DEXAMET EYE OINT: 3.5-10000-0 | 30 days supply | Qty: 4 | Fill #0

## 2019-12-11 LAB — HM DIABETES FOOT EXAM: HM Diabetic Foot Exam: NORMAL

## 2019-12-12 DIAGNOSIS — E1165 Type 2 diabetes mellitus with hyperglycemia: Secondary | ICD-10-CM | POA: Diagnosis not present

## 2019-12-12 DIAGNOSIS — E65 Localized adiposity: Secondary | ICD-10-CM | POA: Diagnosis not present

## 2019-12-12 DIAGNOSIS — E559 Vitamin D deficiency, unspecified: Secondary | ICD-10-CM | POA: Diagnosis not present

## 2019-12-12 LAB — HM HEPATITIS C SCREENING LAB: HM Hepatitis Screen: NEGATIVE

## 2019-12-19 DIAGNOSIS — H3530 Unspecified macular degeneration: Secondary | ICD-10-CM | POA: Insufficient documentation

## 2019-12-19 DIAGNOSIS — H33109 Unspecified retinoschisis, unspecified eye: Secondary | ICD-10-CM | POA: Insufficient documentation

## 2019-12-19 DIAGNOSIS — Q158 Other specified congenital malformations of eye: Secondary | ICD-10-CM | POA: Insufficient documentation

## 2019-12-24 DIAGNOSIS — H04211 Epiphora due to excess lacrimation, right lacrimal gland: Secondary | ICD-10-CM | POA: Diagnosis not present

## 2019-12-24 DIAGNOSIS — H04123 Dry eye syndrome of bilateral lacrimal glands: Secondary | ICD-10-CM | POA: Diagnosis not present

## 2019-12-24 DIAGNOSIS — H02413 Mechanical ptosis of bilateral eyelids: Secondary | ICD-10-CM | POA: Diagnosis not present

## 2019-12-24 DIAGNOSIS — H0231 Blepharochalasis right upper eyelid: Secondary | ICD-10-CM | POA: Diagnosis not present

## 2019-12-24 DIAGNOSIS — H04563 Stenosis of bilateral lacrimal punctum: Secondary | ICD-10-CM | POA: Diagnosis not present

## 2019-12-31 ENCOUNTER — Encounter: Payer: Self-pay | Admitting: Family Medicine

## 2019-12-31 ENCOUNTER — Ambulatory Visit: Payer: Medicare Other | Admitting: Family Medicine

## 2019-12-31 ENCOUNTER — Other Ambulatory Visit: Payer: Self-pay

## 2019-12-31 ENCOUNTER — Other Ambulatory Visit (HOSPITAL_BASED_OUTPATIENT_CLINIC_OR_DEPARTMENT_OTHER): Payer: Self-pay | Admitting: Family Medicine

## 2019-12-31 DIAGNOSIS — E1165 Type 2 diabetes mellitus with hyperglycemia: Secondary | ICD-10-CM | POA: Diagnosis not present

## 2019-12-31 DIAGNOSIS — M7918 Myalgia, other site: Secondary | ICD-10-CM | POA: Diagnosis not present

## 2019-12-31 MED ORDER — CYCLOBENZAPRINE HCL 10 MG PO TABS: ORAL_TABLET | ORAL | 3 refills | Status: DC

## 2019-12-31 MED FILL — ONETOUCH DELICA PLUS LANCET: 50 days supply | Qty: 200 | Fill #0

## 2019-12-31 MED FILL — CYCLOBENZAPRINE HCL 10 MG T: 10 | 30 days supply | Qty: 60 | Fill #0

## 2019-12-31 NOTE — Progress Notes (Addendum)
Chief Complaint  Patient presents with  . Follow-up     neck pain, back of head radiates to R shoulder.  takes flexeril prn     HISTORY OF PRESENT ILLNESS: Today 12/31/19  Abigail [name] is a 69 y.o. female here today for follow up for chronic neck pain. She was referred to PT but states that she can not afford it. She continues cyclobenzaprine as needed and feels it is helpful. She continues to have pain that comes and goes. Pain is worse with prolonged driving or if she reads for extended periods.  She has learned to adjust activities to avoid triggering pain. She does not have many headaches. She is followed closely by PCP.    HISTORY (copied from previous note)  Abigail [name] is a 69 y.o. female here today for follow up for headaches thought to be caused by chronic neck pain. She was advised to try cyclobenzaprine at bedtime and consider PT. PCP was willing to sign handicap plaqard but required that she continue with plans for PT, dry needling prior to signing in future. She feels that the cyclobenzaprine is helping. She continues to have pain. It is worse when she is cleaning. She reports that pin and needling sensation has resolved. She does note radiation of pain to the back of her head and down the right arm at times.   HISTORY: (copied from Dr Cathren Laine note on 03/07/2019)  AGT:XMIWOE Abigail [name] a 69 y.o.femalehere as requested by Larey Seat, MDforheadaches. 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 My colleague Dr. Brett Fairy placed an order for MRI of the brain and cervical spine.I reviewed MRI of the brain report, Chiari I malformation again noted post suboccipital craniectomy and C1 laminectomy in unchanged position of the cerebellar tonsils. Otherwise unremarkable. Normal cord signal no  syrinx.She has a lot of pain in the occipital areas and radiating down the down the shoulders. 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 She denies any other pain. Mostly in the back, does not radiate to the front of the head, she has tried flexeril in 2012 and diclofenac, also tried gabapentin, metoprolol, tramadol, prednisone.    REVIEW OF SYSTEMS: Out of a complete 14 system review of symptoms, the patient complains only of the following symptoms, neck pain and all other reviewed systems are negative.   ALLERGIES: Allergies  Allergen Reactions  . Pollen Extract Itching  . Citrus Itching, Rash and Swelling  . Ibuprofen Swelling and Rash    Had a reaction to ibuprofen x1 but reports she is able to take aspirin  . Other Itching, Rash and Swelling    seafood. seafood     HOME MEDICATIONS: Outpatient Medications Prior to Visit  Medication Sig Dispense Refill  . acetaminophen (TYLENOL) 500 MG tablet Take 1,000 mg by mouth every 6 (six) hours as needed.    Marland Kitchen albuterol (VENTOLIN HFA) 108 (90 Base) MCG/ACT inhaler Inhale 2 puffs into the lungs every 6 (six) hours as needed for wheezing or shortness of breath. 18 g 1  . aspirin EC 81 MG tablet Take 81 mg by mouth daily.    . Blood Glucose Monitoring Suppl (ONE TOUCH ULTRA 2) w/Device KIT Use as advised 1 kit 0  .  Blood Glucose Monitoring Suppl (ONETOUCH VERIO) w/Device KIT Use 1-4 times daily as needed/directed DX E11.9 1 kit 0  . glucose blood (ONE TOUCH ULTRA TEST) test strip Reported on 07/09/2015    . Lancets (ONETOUCH ULTRASOFT) lancets Use 1-4 times daily as needed/directed  DX E11.9 200 each 12  . metFORMIN (GLUCOPHAGE-XR) 500 MG 24 hr tablet Take 1 tablet (500 mg total) by mouth 3 (three) times daily with meals. 270 tablet 3  . ONETOUCH VERIO test strip Use 1-4 times daily as directed/needed   DX E11.9  200 each 12  . OVER THE COUNTER MEDICATION Health joint    . Potassium 99 MG TABS Take by mouth.    . cyclobenzaprine (FLEXERIL) 10 MG tablet Take 1 tablet at bedtime. May also take additional 1/2 tablet up to twice daily. (Patient taking differently: Take 1 tablet at bedtime. May also take additional 1/2 tablet up to twice daily as needed.) 60 tablet 3  . DUREZOL 0.05 % EMUL Place 1 drop into the left eye 2 (two) times daily. (Patient not taking: Reported on 12/31/2019)    . ketorolac (ACULAR) 0.5 % ophthalmic solution Place 1 drop into the right eye 4 (four) times daily. (Patient not taking: Reported on 12/31/2019)    . ofloxacin (OCUFLOX) 0.3 % ophthalmic solution Place 1 drop into the left eye 4 (four) times daily. (Patient not taking: Reported on 12/31/2019)     No facility-administered medications prior to visit.     PAST MEDICAL HISTORY: Past Medical History:  Diagnosis Date  . Asthma   . Chiari malformation type I ([place])   . Diabetes mellitus   . GERD (gastroesophageal reflux disease)   . Hx of gout   . Hyperlipidemia   . Hyperparathyroidism ([place])   . Hypertension   . Lumbar radiculopathy   . Postablative hypothyroidism   . Sickle cell anemia ([facility])   . Spondylolysis   . Thyroid disease      PAST SURGICAL HISTORY: Past Surgical History:  Procedure Laterality Date  . ABDOMINAL HYSTERECTOMY  1994   NO oophorectomy  . BREAST BIOPSY Left 2015   [facility]   . CATARACT EXTRACTION Right 08/2019  . CERVICAL LAMINECTOMY     at C1 w/ duraplasty  . CRANIECTOMY SUBOCCIPITAL W/ CERVICAL LAMINECTOMY / CHIARI  07/05/2011   At C1, [facility] at [facility]  . [facility]ECTOMY  2015   [facility]      FAMILY HISTORY: Family History  Problem Relation Age of Onset  . Hypertension Sister        sister, daughter , mother   . Hyperlipidemia Sister   . Thyroid disease Sister   . Diabetes Daughter   . Stroke Mother   . Colon cancer Neg Hx   . Sudden death Neg Hx   . Heart attack Neg Hx   .  Breast cancer Neg Hx   . Stomach cancer Neg Hx      SOCIAL HISTORY: Social History   Socioeconomic History  . Marital status: Married    Spouse name: Not on file  . Number of children: 1  . Years of education: Not on file  . Highest education level: Not on file  Occupational History  . Occupation: Pharmacist, hospital , high school  Tobacco Use  . Smoking status: Never Smoker  . Smokeless tobacco: Never Used  Vaping Use  . Vaping Use: Never used  Substance and Sexual Activity  . Alcohol use: No  . Drug use: No  . Sexual activity: Not  Currently    Partners: Male    Comment: 1st intercourse- 1, married- 51 yrs   Other Topics Concern  . Not on file  Social History Narrative   Original from [place]   Married x 7 years   1 daughter in [place]   Jehovah witness : no transfusions    Social Determinants of Health   Financial Resource Strain:   . Difficulty of Paying Living Expenses: Not on file  Food Insecurity:   . Worried About Charity fundraiser in the Last Year: Not on file  . Ran Out of Food in the Last Year: Not on file  Transportation Needs:   . Lack of Transportation (Medical): Not on file  . Lack of Transportation (Non-Medical): Not on file  Physical Activity:   . Days of Exercise per Week: Not on file  . Minutes of Exercise per Session: Not on file  Stress:   . Feeling of Stress : Not on file  Social Connections:   . Frequency of Communication with Friends and Family: Not on file  . Frequency of Social Gatherings with Friends and Family: Not on file  . Attends Religious Services: Not on file  . Active Member of Clubs or Organizations: Not on file  . Attends Archivist Meetings: Not on file  . Marital Status: Not on file  Intimate Partner Violence:   . Fear of Current or Ex-Partner: Not on file  . Emotionally Abused: Not on file  . Physically Abused: Not on file  . Sexually Abused: Not on file      PHYSICAL EXAM  Vitals:   12/31/19 0724  BP: (!)  128/59  Pulse: 66  Weight: 176 lb 9.6 oz (80.1 kg)  Height: 5' 6" (1.676 m)   Body mass index is 28.5 kg/m.   Generalized: Well developed, in no acute distress  Cardiology: normal rate and rhythm, no murmur auscultated  Respiratory: clear to auscultation bilaterally    Neurological examination  Mentation: Alert oriented to time, place, history taking. Follows all commands speech and language fluent Cranial nerve II-XII: Pupils were equal round reactive to light. Extraocular movements were full, visual field were full  Motor: The motor testing reveals 5 over 5 strength of all 4 extremities. Good symmetric motor tone is noted throughout.  Gait and station: Gait is normal.     DIAGNOSTIC DATA (LABS, IMAGING, TESTING) - I reviewed patient records, labs, notes, testing and imaging myself where available.  Lab Results  Component Value Date   WBC 4.6 10/17/2019   HGB 12.3 10/17/2019   HCT 38.0 10/17/2019   MCV 88.2 10/17/2019   PLT 272 10/17/2019      Component Value Date/Time   NA 139 10/17/2019 0715   NA 141 07/25/2018 1048   K 4.2 10/17/2019 0715   CL 103 10/17/2019 0715   CO2 28 10/17/2019 0715   GLUCOSE 131 (H) 10/17/2019 0715   BUN 14 10/17/2019 0715   BUN 17 07/25/2018 1048   CREATININE 0.93 10/17/2019 0715   CALCIUM 9.0 10/17/2019 0715   PROT 6.7 10/17/2019 0715   PROT 7.3 07/25/2018 1048   ALBUMIN 4.2 01/08/2019 1138   ALBUMIN 5.0 (H) 07/25/2018 1048   AST 17 10/17/2019 0715   ALT 12 10/17/2019 0715   ALKPHOS 92 01/08/2019 1138   BILITOT 0.6 10/17/2019 0715   BILITOT 0.3 07/25/2018 1048   GFRNONAA >60 01/08/2019 1138   GFRAA >60 01/08/2019 1138   Lab Results  Component Value Date  CHOL 178 10/17/2019   HDL 68 10/17/2019   LDLCALC 95 10/17/2019   TRIG 63 10/17/2019   CHOLHDL 2.6 10/17/2019   Lab Results  Component Value Date   HGBA1C 6.8 (A) 07/18/2019   Lab Results  Component Value Date   VITAMINB12 616 07/18/2019   Lab Results  Component  Value Date   TSH 4.42 12/04/2019      ASSESSMENT AND PLAN  69 y.o. year old female  has a past medical history of Asthma, Chiari malformation type I ([place]), Diabetes mellitus, GERD (gastroesophageal reflux disease), gout, Hyperlipidemia, Hyperparathyroidism ([place]), Hypertension, Lumbar radiculopathy, Postablative hypothyroidism, Sickle cell anemia ([place]), Spondylolysis, and Thyroid disease. here with   Cervical myofascial pain syndrome - Plan: cyclobenzaprine (FLEXERIL) 10 MG tablet   Ms. Lilia [name] continues to do fairly well. Pain is managed with cyclobenzaprine PRN. Complementary therapies reviewed to manage pain such as heat, ice, stretching, etc. She will continue current treatment plan. She will continue close follow up with PCP. Cyclobenzaprine refilled and she is aware to continue refills with PCP as needed. Consider pain management if pain worsens. No follow up needed at this time. She verbalizes understanding and agreement with this plan.   I spent 20 minutes of face-to-face and non-face-to-face time with patient.  This included previsit chart review, lab review, study review, order entry, electronic health record documentation, patient education.    Debbora Presto, MSN, FNP-C 12/31/2019, 8:13 AM  [facility] [address] [place], [place] [address] [phone]  Made any corrections needed, and agree with history, physical, neuro exam,assessment and plan as stated.     Sarina Ill, MD [facility]

## 2019-12-31 NOTE — Patient Instructions (Signed)
Below is our plan:  We will continue cyclobenzaprine as needed. You may continue refills with PCP.   Please make sure you are staying well hydrated. I recommend 50-60 ounces daily. Well balanced diet and regular exercise encouraged.   Please continue follow up with care team as directed.   Follow up with Dr Drue Novel.   You may receive a survey regarding today's visit. I encourage you to leave honest feed back as I do use this information to improve patient care. Thank you for seeing me today!      Cervical Radiculopathy  Cervical radiculopathy means that a nerve in the neck (a cervical nerve) is pinched or bruised. This can happen because of an injury to the cervical spine (vertebrae) in the neck, or as a normal part of getting older. This can cause pain or loss of feeling (numbness) that runs from your neck all the way down to your arm and fingers. Often, this condition gets better with rest. Treatment may be needed if the condition does not get better. What are the causes?  A neck injury.  A bulging disk in your spine.  Muscle movements that you cannot control (muscle spasms).  Tight muscles in your neck due to overuse.  Arthritis.  Breakdown in the bones and joints of the spine (spondylosis) due to getting older.  Bone spurs that form near the nerves in the neck. What are the signs or symptoms?  Pain. The pain may: ? Run from the neck to the arm and hand. ? Be very bad or irritating. ? Be worse when you move your neck.  Loss of feeling or tingling in your arm or hand.  Weakness in your arm or hand, in very bad cases. How is this treated? In many cases, treatment is not needed for this condition. With rest, the condition often gets better over time. If treatment is needed, options may include:  Wearing a soft neck collar (cervical collar) for short periods of time, as told by your doctor.  Doing exercises (physical therapy) to strengthen your neck muscles.  Taking  medicines.  Having shots (injections) in your spine, in very bad cases.  Having surgery. This may be needed if other treatments do not help. The type of surgery that is used depends on the cause of your condition. Follow these instructions at home: If you have a soft neck collar:  Wear it as told by your doctor. Remove it only as told by your doctor.  Ask your doctor if you can remove the collar for cleaning and bathing. If you are allowed to remove the collar for cleaning or bathing: ? Follow instructions from your doctor about how to remove the collar safely. ? Clean the collar by wiping it with mild soap and water and drying it completely. ? Take out any removable pads in the collar every 1-2 days. Wash them by hand with soap and water. Let them air-dry completely before you put them back in the collar. ? Check your skin under the collar for redness or sores. If you see any, tell your doctor. Managing pain      Take over-the-counter and prescription medicines only as told by your doctor.  If told, put ice on the painful area. ? If you have a soft neck collar, remove it as told by your doctor. ? Put ice in a plastic bag. ? Place a towel between your skin and the bag. ? Leave the ice on for 20 minutes, 2-3 times a  day.  If using ice does not help, you can try using heat. Use the heat source that your doctor recommends, such as a moist heat pack or a heating pad. ? Place a towel between your skin and the heat source. ? Leave the heat on for 20-30 minutes. ? Remove the heat if your skin turns bright red. This is very important if you are unable to feel pain, heat, or cold. You may have a greater risk of getting burned.  You may try a gentle neck and shoulder rub (massage). Activity  Rest as needed.  Return to your normal activities as told by your doctor. Ask your doctor what activities are safe for you.  Do exercises as told by your doctor or physical therapist.  Do not lift  anything that is heavier than 10 lb (4.5 kg) until your doctor tells you that it is safe. General instructions  Use a flat pillow when you sleep.  Do not drive while wearing a soft neck collar. If you do not have a soft neck collar, ask your doctor if it is safe to drive while your neck heals.  Ask your doctor if the medicine prescribed to you requires you to avoid driving or using heavy machinery.  Do not use any products that contain nicotine or tobacco, such as cigarettes, e-cigarettes, and chewing tobacco. These can delay healing. If you need help quitting, ask your doctor.  Keep all follow-up visits as told by your doctor. This is important. Contact a doctor if:  Your condition does not get better with treatment. Get help right away if:  Your pain gets worse and is not helped with medicine.  You lose feeling or feel weak in your hand, arm, face, or leg.  You have a high fever.  You have a stiff neck.  You cannot control when you poop or pee (have incontinence).  You have trouble with walking, balance, or talking. Summary  Cervical radiculopathy means that a nerve in the neck is pinched or bruised.  A nerve can get pinched from a bulging disk, arthritis, an injury to the neck, or other causes.  Symptoms include pain, tingling, or loss of feeling that goes from the neck into the arm or hand.  Weakness in your arm or hand can happen in very bad cases.  Treatment may include resting, wearing a soft neck collar, and doing exercises. You might need to take medicines for pain. In very bad cases, shots or surgery may be needed. This information is not intended to replace advice given to you by your health care provider. Make sure you discuss any questions you have with your health care provider. Document Revised: 12/02/2017 Document Reviewed: 12/02/2017 Elsevier Patient Education  2020 [employer].

## 2020-01-08 ENCOUNTER — Encounter: Payer: Self-pay | Admitting: Internal Medicine

## 2020-01-16 ENCOUNTER — Ambulatory Visit: Payer: Medicare Other | Admitting: Internal Medicine

## 2020-01-16 ENCOUNTER — Other Ambulatory Visit: Payer: Self-pay

## 2020-01-16 ENCOUNTER — Encounter: Payer: Self-pay | Admitting: Internal Medicine

## 2020-01-16 VITALS — BP 120/88 | HR 99 | Ht 66.0 in | Wt 176.8 lb

## 2020-01-16 DIAGNOSIS — E210 Primary hyperparathyroidism: Secondary | ICD-10-CM

## 2020-01-16 DIAGNOSIS — R748 Abnormal levels of other serum enzymes: Secondary | ICD-10-CM

## 2020-01-16 DIAGNOSIS — E119 Type 2 diabetes mellitus without complications: Secondary | ICD-10-CM | POA: Diagnosis not present

## 2020-01-16 DIAGNOSIS — E559 Vitamin D deficiency, unspecified: Secondary | ICD-10-CM | POA: Diagnosis not present

## 2020-01-16 LAB — POCT GLYCOSYLATED HEMOGLOBIN (HGB A1C): Hemoglobin A1C: 6.8 % — AB (ref 4.0–5.6)

## 2020-01-16 LAB — VITAMIN B12: Vitamin B-12: 560 pg/mL (ref 211–911)

## 2020-01-16 NOTE — Addendum Note (Signed)
Addended by: Mervin Kung A on: 01/16/2020 03:39 PM   Modules accepted: Orders

## 2020-01-16 NOTE — Progress Notes (Signed)
Patient ID: Abigail [name], female   DOB: [birth year], 69 y.o.   MRN: [id number]  This visit occurred during the SARS-CoV-2 public health emergency.  Safety protocols were in place, including screening questions prior to the visit, additional usage of staff PPE, and extensive cleaning of exam room while observing appropriate contact time as indicated for disinfecting solutions.   HPI: Abigail [name] is a 69 y.o.-year-old female, returning for f/u for DM2, dx in ~2010, non-insulin-dependent, controlled, without complications and h/o primary HPTH.  She is the wife of [name], also my patient. Last visit 6 months ago.  She reduced carbohydrates since last OV.  DM2: Reviewed HbA1c levels: Lab Results  Component Value Date   HGBA1C 6.8 (A) 07/18/2019   HGBA1C 6.7 (A) 01/16/2019   HGBA1C 7.1 (A) 07/10/2018   Pt is on a regimen of: - Metformin ER 500 mg 2x >> 3x a day  Pt checks her sugars 1-2x a day per review of her log - am: 88-156, 160 >> 80, 115-130, 140 >> 117-157 - 2h after b'fast: 106-115 >> 80-132, 167, 206 (banana) >> 113, 148 - before lunch: 120, 128, 167, 200 >> 99-119 >> n/c - 2h after lunch: 162 >> n/c >> 105, 106 >> n/c >> 171, 226 - before dinner: 94 >> 103 >> 112, 181 >> n/c >> 111, 133 - 2h after dinner:  189 (ate late) >> n/c >> 97, 141 >> n/c - bedtime: n/c >> 132 >> n/c >> 152 - nighttime: n/c Lowest sugar was 88 >> 80 >> 76; she has hypoglycemia awareness in the 80s. Highest sugar was  181 >> 206 >> 226 (forgot Metformin).  Glucometer: ReliOn  Pt's meals are: - Breakfast: Fruit, eggs - Lunch: chicken/fish + veggies - no red meat  - Dinner: veggies + fruit +/- chicken/fish - Snacks: cashews, fruit (Mango)  No CKD, last BUN/creatinine:  Lab Results  Component Value Date   BUN 14 10/17/2019   CREATININE 0.93 10/17/2019   No HL; last set of lipids: Lab Results  Component Value Date   CHOL 178 10/17/2019   HDL 68 10/17/2019   LDLCALC  95 10/17/2019   TRIG 63 10/17/2019   CHOLHDL 2.6 10/17/2019  She is not on a statin. - last eye exam was in 2021: No DR-she had cataract surgery.  -+ Numbness and tingling in her feet.  She has a history of surgery on her toe  H/o primary hyperparathyroidism  - s/p 2x parathyroid glands resected in 2015, with resolution of her hypercalcemia  She will in the ED with paresthesia and pain in the right arm on 01/08/2019.  At that time, calcium was slightly low, at 8.7 (8.9-10.3).  It normalized afterwards.  I advised her to take Tums x 1 tab with dinner every night.  Calcium was normal at last check: Lab Results  Component Value Date   CALCIUM 9.0 10/17/2019   CALCIUM 8.9 01/16/2019   CALCIUM 8.7 (L) 01/08/2019   CALCIUM 10.3 07/25/2018   CALCIUM 9.2 07/20/2017   CALCIUM 9.1 07/21/2016   CALCIUM 8.8 02/20/2016   CALCIUM 9.1 12/22/2015   CALCIUM 9.2 07/09/2015   CALCIUM 9.2 01/02/2015   Vitamin D deficiency:  Reviewed vitamin D levels: Lab Results  Component Value Date   VD25OH 40 10/17/2019   VD25OH 31.28 01/16/2019   VD25OH 35.44 02/16/2018   VD25OH 14.17 (L) 08/12/2017  She is on 5000 is vitamin D daily.  Reviewed vitamin B-12 levels: Lab Results  Component  Value Date   VITAMINB12 616 07/18/2019   VITAMINB12 >1500 (H) 01/16/2019   VITAMINB12 >1500 (H) 07/20/2017   VITAMINB12 623 07/21/2016   VITAMINB12 394 07/09/2015  In the past, on B12 2000 mcg daily but on this dose, her level returned high.  We decrease the dose to 1000 mcg daily but she was off at last visit.  We continued off the supplement since her B12 level is normal.  Previously on gabapentin, now off.  She also has a history of a slightly elevated TSH in 09/2019 >> we rechecked another TSH since then and is normalized: Lab Results  Component Value Date   TSH 4.42 12/04/2019   TSH 5.49 (H) 10/17/2019   TSH 2.11 09/01/2018   TSH 1.77 08/26/2017   TSH 1.88 07/21/2016   TSH 1.73 01/02/2015    ROS: Constitutional: no weight gain/no weight loss, + fatigue, no subjective hyperthermia, no subjective hypothermia Eyes: no blurry vision, no xerophthalmia ENT: no sore throat, no nodules palpated in neck, no dysphagia, no odynophagia, no hoarseness Cardiovascular: no CP/no SOB/no palpitations/no leg swelling Respiratory: no cough/no SOB/no wheezing Gastrointestinal: no N/no V/no D/no C/no acid reflux Musculoskeletal: no muscle aches/no joint aches Skin: no rashes, + hair loss Neurological: no tremors/no numbness/no tingling/no dizziness  I reviewed pt's medications, allergies, PMH, social hx, family hx, and changes were documented in the history of present illness. Otherwise, unchanged from my initial visit note.   Past Medical History:  Diagnosis Date  . Asthma   . Chiari malformation type I ([place])   . Diabetes mellitus   . GERD (gastroesophageal reflux disease)   . Hx of gout   . Hyperlipidemia   . Hyperparathyroidism ([place])   . Hypertension   . Lumbar radiculopathy   . Postablative hypothyroidism   . Sickle cell anemia ([facility])   . Spondylolysis   . Thyroid disease    Past Surgical History:  Procedure Laterality Date  . ABDOMINAL HYSTERECTOMY  1994   NO oophorectomy  . BREAST BIOPSY Left 2015   [facility]   . CATARACT EXTRACTION Right 08/2019  . CERVICAL LAMINECTOMY     at C1 w/ duraplasty  . CRANIECTOMY SUBOCCIPITAL W/ CERVICAL LAMINECTOMY / CHIARI  07/05/2011   At C1, performed at [facility]  . PARATHYROIDECTOMY  2015   [facility]    Social History   Social History  . Marital Status: Married    Spouse Name: N/A  . Number of Children: 1   Occupational History  . teacher - high school     Social History Main Topics  . Smoking status: Never Smoker   . Smokeless tobacco: Not on file  . Alcohol Use: No  . Drug Use: No   Social History Narrative   Original from [place]   Married x 44 years   Current Outpatient Medications on File Prior to Visit  Medication Sig  Dispense Refill  . acetaminophen (TYLENOL) 500 MG tablet Take 1,000 mg by mouth every 6 (six) hours as needed.    Marland Kitchen albuterol (VENTOLIN HFA) 108 (90 Base) MCG/ACT inhaler Inhale 2 puffs into the lungs every 6 (six) hours as needed for wheezing or shortness of breath. 18 g 1  . aspirin EC 81 MG tablet Take 81 mg by mouth daily.    . Blood Glucose Monitoring Suppl (ONE TOUCH ULTRA 2) w/Device KIT Use as advised 1 kit 0  . Blood Glucose Monitoring Suppl (ONETOUCH VERIO) w/Device KIT Use 1-4 times daily as needed/directed DX E11.9 1 kit 0  .  cyclobenzaprine (FLEXERIL) 10 MG tablet Take 1 tablet at bedtime. May also take additional 1/2 tablet up to twice daily as needed. 60 tablet 3  . glucose blood (ONE TOUCH ULTRA TEST) test strip Reported on 07/09/2015    . Lancets (ONETOUCH ULTRASOFT) lancets Use 1-4 times daily as needed/directed  DX E11.9 200 each 12  . metFORMIN (GLUCOPHAGE-XR) 500 MG 24 hr tablet Take 1 tablet (500 mg total) by mouth 3 (three) times daily with meals. 270 tablet 3  . ONETOUCH VERIO test strip Use 1-4 times daily as directed/needed   DX E11.9 200 each 12  . OVER THE COUNTER MEDICATION Health joint    . Potassium 99 MG TABS Take by mouth.     No current facility-administered medications on file prior to visit.   Allergies  Allergen Reactions  . Pollen Extract Itching  . Citrus Itching, Rash and Swelling  . Ibuprofen Swelling and Rash    Had a reaction to ibuprofen x1 but reports she is able to take aspirin  . Other Itching, Rash and Swelling    seafood. seafood   Family History  Problem Relation Age of Onset  . Hypertension Sister        sister, daughter , mother   . Hyperlipidemia Sister   . Thyroid disease Sister   . Diabetes Daughter   . Stroke Mother   . Colon cancer Neg Hx   . Sudden death Neg Hx   . Heart attack Neg Hx   . Breast cancer Neg Hx   . Stomach cancer Neg Hx    PE: BP 120/88   Pulse 99   Ht 5' 6"  (1.676 m)   Wt 176 lb 12.8 oz (80.2 kg)    SpO2 98%   BMI 28.54 kg/m  Wt Readings from Last 3 Encounters:  01/16/20 176 lb 12.8 oz (80.2 kg)  12/31/19 176 lb 9.6 oz (80.1 kg)  11/08/19 179 lb (81.2 kg)   Constitutional: Slightly overweight, in NAD Eyes: PERRLA, EOMI, no exophthalmos ENT: moist mucous membranes, no thyromegaly, no cervical lymphadenopathy Cardiovascular: Tachycardia, RR, No MRG Respiratory: CTA B Gastrointestinal: abdomen soft, NT, ND, BS+ Musculoskeletal: no deformities, strength intact in all 4 Skin: moist, warm, no rashes Neurological: no tremor with outstretched hands, DTR normal in all 4  ASSESSMENT: 1. DM2, non-insulin-dependent, now more controlled, without complications  2. History of hyperparathyroidism -  status post 2 gland parathyroidectomy in 2015   3. Vit D def  4.  History of elevated B12 vitamin  PLAN:  1. Patient with controlled type 2 diabetes, on an oral antidiabetic regimen with Metformin ER only.   After the coronavirus pandemic started, sugars are higher but they improved after increasing the dose of Metformin to 3 times a day.  At last visit, her blood sugars were mostly at goal, with few exceptions, especially after eating fruit.  I encouraged her to continue to eat fruit since the benefit greatly outweigh the risk of eating them. -At last visit, she was not tolerating the Metformin ER that she obtained from [facility] (from [employer]) so I printed a prescription for her to take another pharmacy and see if she can obtain it from another manufacturer. -Since last visit, she tells me that she reduced her carbohydrate intake.  She lost 6 pounds.  Sugars, however, are not much improved, per review of the meter downloads.  However, they are mostly at goal so for now we will continue the current regimen.  - I  suggested to:  Patient Instructions  Please continue: -  Metformin ER 500 mg 3x a day.  Also, continue: - vitamin D 5000 units daily - 1 Tums a day with dinner  Please  stop at the lab.  Please return in 4-6 months with your sugar log.   - we checked her HbA1c: 6.8% (stable) - advised to check sugars at different times of the day - 1x a day, rotating check times - advised for yearly eye exams >> she is UTD - return to clinic in 4-6 months  2. History of primary hyperparathyroidism -She is status post parathyroidectomy -In 2020, she was in the emergency room with paresthesia and pain in the right arm and was found to have a minimally low calcium, at 8.7.  Afterwards, calcium improved and PTH and vitamin D levels were also normal -In the past, she was taking calcium (Tums) when she felt weak and tired but after the above episode, I advised her to take it daily.  At last visit, she was not taking it consistently and I again advised her to start -Latest calcium level was reviewed from 09/2019 and was 9.0, normal  3. Vit D def -Vitamin D was low in the past we started 5000 units vitamin D daily -She ran out before last visit so I advised her to restart -vitamin D level was normal 3 mo ago  4.  Elevated B12 vitamin -She had an undetectably high B12 level earlier in the year, however, we rechecked this in 06/2019 and a level was normal. -We continued off the B12 supplement -We will recheck her vitamin B12 level now  Component     Latest Ref Rng & Units 01/16/2020  Vitamin B12     211 - 911 pg/mL 560  Normal B12.  Philemon Kingdom, MD PhD [facility]

## 2020-01-16 NOTE — Patient Instructions (Signed)
Please continue: -  Metformin ER 500 mg 3x a day.  Also, continue: - vitamin D 5000 units daily - 1 Tums a day with dinner  Please stop at the lab.  Please return in 4-6 months with your sugar log.

## 2020-01-21 ENCOUNTER — Other Ambulatory Visit: Payer: Medicare Other

## 2020-01-23 MED FILL — metFORMIN HCL ER 500 MG TB2: 500 | 90 days supply | Qty: 270 | Fill #1

## 2020-02-04 DIAGNOSIS — E1165 Type 2 diabetes mellitus with hyperglycemia: Secondary | ICD-10-CM | POA: Diagnosis not present

## 2020-02-13 ENCOUNTER — Encounter (INDEPENDENT_AMBULATORY_CARE_PROVIDER_SITE_OTHER): Payer: BC Managed Care – PPO | Admitting: Ophthalmology

## 2020-02-25 MED FILL — ONETOUCH VERIO TEST STRIP: 50 days supply | Qty: 200 | Fill #1

## 2020-03-05 DIAGNOSIS — E1165 Type 2 diabetes mellitus with hyperglycemia: Secondary | ICD-10-CM | POA: Diagnosis not present

## 2020-03-05 DIAGNOSIS — I10 Essential (primary) hypertension: Secondary | ICD-10-CM | POA: Diagnosis not present

## 2020-03-10 DIAGNOSIS — H04123 Dry eye syndrome of bilateral lacrimal glands: Secondary | ICD-10-CM | POA: Diagnosis not present

## 2020-03-16 DIAGNOSIS — H5789 Other specified disorders of eye and adnexa: Secondary | ICD-10-CM | POA: Diagnosis not present

## 2020-03-16 DIAGNOSIS — R21 Rash and other nonspecific skin eruption: Secondary | ICD-10-CM | POA: Diagnosis not present

## 2020-04-02 DIAGNOSIS — I10 Essential (primary) hypertension: Secondary | ICD-10-CM | POA: Diagnosis not present

## 2020-04-02 DIAGNOSIS — E1165 Type 2 diabetes mellitus with hyperglycemia: Secondary | ICD-10-CM | POA: Diagnosis not present

## 2020-04-03 MED FILL — ONETOUCH DELICA PLUS LANCET: 50 days supply | Qty: 200 | Fill #1

## 2020-05-01 DIAGNOSIS — E1165 Type 2 diabetes mellitus with hyperglycemia: Secondary | ICD-10-CM | POA: Diagnosis not present

## 2020-05-20 ENCOUNTER — Ambulatory Visit: Payer: Medicare Other | Attending: Internal Medicine

## 2020-05-20 ENCOUNTER — Other Ambulatory Visit (HOSPITAL_BASED_OUTPATIENT_CLINIC_OR_DEPARTMENT_OTHER): Payer: Self-pay

## 2020-05-20 DIAGNOSIS — Z23 Encounter for immunization: Secondary | ICD-10-CM

## 2020-05-20 MED FILL — Lancets: 50 days supply | Qty: 200 | Fill #0 | Status: AC

## 2020-05-20 MED FILL — Glucose Blood Test Strip: 50 days supply | Qty: 200 | Fill #0 | Status: AC

## 2020-05-20 NOTE — Progress Notes (Signed)
   Covid-19 Vaccination Clinic  Name:  Abigail [name]    MRN: [id number] DOB: [birth year]  05/20/2020  Ms. Abigail [name] was observed post Covid-19 immunization for 15 minutes without incident. She was provided with Vaccine Information Sheet and instruction to access the V-Safe system. Vaccine administered by Norton Pastel, CPhT.  Ms. Abigail [name] was instructed to call 911 with any severe reactions post vaccine: Marland Kitchen Difficulty breathing  . Swelling of face and throat  . A fast heartbeat  . A bad rash all over body  . Dizziness and weakness   Immunizations Administered    Name Date Dose VIS Date Route   PFIZER Comrnaty(Gray TOP) Covid-19 Vaccine 05/20/2020 12:14 PM 0.3 mL 01/03/2020 Intramuscular   Manufacturer: [employer], [employer]   Lot: [id number]   NDC: [phone]

## 2020-05-21 ENCOUNTER — Other Ambulatory Visit: Payer: Self-pay

## 2020-05-21 ENCOUNTER — Ambulatory Visit (INDEPENDENT_AMBULATORY_CARE_PROVIDER_SITE_OTHER): Payer: Medicare Other | Admitting: Internal Medicine

## 2020-05-21 ENCOUNTER — Encounter: Payer: Self-pay | Admitting: Internal Medicine

## 2020-05-21 VITALS — BP 128/88 | HR 72 | Ht 66.0 in | Wt 164.0 lb

## 2020-05-21 DIAGNOSIS — E210 Primary hyperparathyroidism: Secondary | ICD-10-CM | POA: Diagnosis not present

## 2020-05-21 DIAGNOSIS — E119 Type 2 diabetes mellitus without complications: Secondary | ICD-10-CM

## 2020-05-21 DIAGNOSIS — R748 Abnormal levels of other serum enzymes: Secondary | ICD-10-CM

## 2020-05-21 DIAGNOSIS — E559 Vitamin D deficiency, unspecified: Secondary | ICD-10-CM

## 2020-05-21 LAB — POCT GLYCOSYLATED HEMOGLOBIN (HGB A1C): Hemoglobin A1C: 6.3 % — AB (ref 4.0–5.6)

## 2020-05-21 NOTE — Patient Instructions (Addendum)
Please continue: -  Metformin ER 500 mg 3x a day.  Also, continue: - vitamin D 5000 units daily - 1 Tums a day with dinner  Please return in 5 months with your sugar log.

## 2020-05-21 NOTE — Progress Notes (Signed)
Patient ID: Abigail [name], female   DOB: [birth year], 70 y.o.   MRN: [id number]  This visit occurred during the SARS-CoV-2 public health emergency.  Safety protocols were in place, including screening questions prior to the visit, additional usage of staff PPE, and extensive cleaning of exam room while observing appropriate contact time as indicated for disinfecting solutions.   HPI: Abigail [name] is a 70 y.o.-year-old female, returning for f/u for DM2, dx in ~2010, non-insulin-dependent, controlled, without complications and h/o primary HPTH.  She is the wife of Abigail [name], also my patient. Last visit 4 months ago.  Interim history: She continue improving diet >> reduces carbohydrates. However, she is not exercising as she is too busy. She feels good, without complaints today.  DM2: Reviewed HbA1c levels: Lab Results  Component Value Date   HGBA1C 6.8 (A) 01/16/2020   HGBA1C 6.8 (A) 07/18/2019   HGBA1C 6.7 (A) 01/16/2019   Pt is on a regimen of: - Metformin ER 500 mg 2x >> 3x a day  Pt checks her sugars 1-2x a day per review of her log - am: 88-156, 160 >> 80, 115-130, 140 >> 117-157 >> 58, 68-129 - 2h after b'fast: 80-132, 167, 206 (banana) >> 113, 148 >> 101-136 - before lunch: 120, 128, 167, 200 >> 99-119 >> n/c - 2h after lunch: n/c >> 105, 106 >> n/c >> 171, 226 >> 177 - before dinner: 103 >> 112, 181 >> n/c >> 111, 133 >> 101-135 - 2h after dinner:  189 (ate late) >> n/c >> 97, 141 >> n/c >> 124-147, 161 - bedtime: n/c >> 132 >> n/c >> 152 >> n/c - nighttime: n/c Lowest sugar was 88 >> 80 >> 76 >> 58; she has hypoglycemia awareness in the 80s. Highest sugar was  181 >> 206 >> 226 (forgot Metformin) >> 161.  Glucometer: ReliOn  Pt's meals are: - Breakfast: Fruit, eggs - Lunch: chicken/fish + veggies - no red meat  - Dinner: veggies + fruit +/- chicken/fish - Snacks: cashews, fruit (Mango)  No CKD, last BUN/creatinine:  Lab Results  Component  Value Date   BUN 14 10/17/2019   CREATININE 0.93 10/17/2019   No HL; last set of lipids: Lab Results  Component Value Date   CHOL 178 10/17/2019   HDL 68 10/17/2019   LDLCALC 95 10/17/2019   TRIG 63 10/17/2019   CHOLHDL 2.6 10/17/2019  She is not on a statin. - last eye exam was in 2021: No DR-she had cataract surgery.  -+ Numbness and tingling in her feet.  She has a history of surgery on her toe  H/o primary hyperparathyroidism  - s/p 2x parathyroid glands resected in 2015, with resolution of her hypercalcemia  She will in the ED with paresthesia and pain in the right arm on 01/08/2019.  At that time, calcium was slightly low, at 8.7 (8.9-10.3).  It normalized afterwards.  I advised her to take Tums x 1 tab with dinner every night.  She continues with these now.  Calcium was normal at last check: Lab Results  Component Value Date   CALCIUM 9.0 10/17/2019   CALCIUM 8.9 01/16/2019   CALCIUM 8.7 (L) 01/08/2019   CALCIUM 10.3 07/25/2018   CALCIUM 9.2 07/20/2017   CALCIUM 9.1 07/21/2016   CALCIUM 8.8 02/20/2016   CALCIUM 9.1 12/22/2015   CALCIUM 9.2 07/09/2015   CALCIUM 9.2 01/02/2015   Vitamin D deficiency:  Reviewed vitamin D levels: Lab Results  Component Value Date  VD25OH 40 10/17/2019   VD25OH 31.28 01/16/2019   VD25OH 35.44 02/16/2018   VD25OH 14.17 (L) 08/12/2017  She is on 5000 is vitamin D daily.  Reviewed vitamin B-12 levels: Lab Results  Component Value Date   VITAMINB12 560 01/16/2020   VITAMINB12 616 07/18/2019   VITAMINB12 >1500 (H) 01/16/2019   VITAMINB12 >1500 (H) 07/20/2017   VITAMINB12 623 07/21/2016   VITAMINB12 394 07/09/2015  In the past, on B12 2000 mcg daily but on this dose, her level returned high.  We decrease the dose to 1000 mcg daily but she stopped it.  We continued off the supplement since her B12 level is normal.  Previously on gabapentin, now off.  She also has a history of a slightly elevated TSH in 09/2019 >> we rechecked  another TSH since then and is normalized: Lab Results  Component Value Date   TSH 4.42 12/04/2019   TSH 5.49 (H) 10/17/2019   TSH 2.11 09/01/2018   TSH 1.77 08/26/2017   TSH 1.88 07/21/2016   TSH 1.73 01/02/2015   ROS: Constitutional: no weight gain/no weight loss, no fatigue, no subjective hyperthermia, no subjective hypothermia Eyes: no blurry vision, no xerophthalmia ENT: no sore throat, no nodules palpated in neck, no dysphagia, no odynophagia, no hoarseness Cardiovascular: no CP/no SOB/no palpitations/no leg swelling Respiratory: no cough/no SOB/no wheezing Gastrointestinal: no N/no V/no D/no C/no acid reflux Musculoskeletal: no muscle aches/no joint aches Skin: no rashes, + hair loss Neurological: no tremors/no numbness/no tingling/no dizziness  I reviewed pt's medications, allergies, PMH, social hx, family hx, and changes were documented in the history of present illness. Otherwise, unchanged from my initial visit note.   Past Medical History:  Diagnosis Date  . Asthma   . Chiari malformation type I ([place])   . Diabetes mellitus   . GERD (gastroesophageal reflux disease)   . Hx of gout   . Hyperlipidemia   . Hyperparathyroidism ([place])   . Hypertension   . Lumbar radiculopathy   . Postablative hypothyroidism   . Sickle cell anemia ([facility])   . Spondylolysis   . Thyroid disease    Past Surgical History:  Procedure Laterality Date  . ABDOMINAL HYSTERECTOMY  1994   NO oophorectomy  . BREAST BIOPSY Left 2015   [facility]   . CATARACT EXTRACTION Right 08/2019  . CERVICAL LAMINECTOMY     at C1 w/ duraplasty  . CRANIECTOMY SUBOCCIPITAL W/ CERVICAL LAMINECTOMY / CHIARI  07/05/2011   At C1, [facility]  . [facility]OMY  2015   [facility]    Social History   Social History  . Marital Status: Married    Spouse Name: N/A  . Number of Children: 1   Occupational History  . teacher - high school     Social History Main Topics  . Smoking status: Never Smoker   .  Smokeless tobacco: Not on file  . Alcohol Use: No  . Drug Use: No   Social History Narrative   Original from [place]   Married x 44 years   Current Outpatient Medications on File Prior to Visit  Medication Sig Dispense Refill  . acetaminophen (TYLENOL) 500 MG tablet Take 1,000 mg by mouth every 6 (six) hours as needed.    Marland Kitchen albuterol (VENTOLIN HFA) 108 (90 Base) MCG/ACT inhaler Inhale 2 puffs into the lungs every 6 (six) hours as needed for wheezing or shortness of breath. 18 g 1  . aspirin EC 81 MG tablet Take 81 mg by mouth daily.    Marland Kitchen  Blood Glucose Monitoring Suppl (ONE TOUCH ULTRA 2) w/Device KIT Use as advised 1 kit 0  . Blood Glucose Monitoring Suppl (ONETOUCH VERIO FLEX SYSTEM) w/Device KIT USE 1-4 TIMES DAILY AS NEEDED/DIRECTED 1 kit 0  . Blood Glucose Monitoring Suppl (ONETOUCH VERIO) w/Device KIT Use 1-4 times daily as needed/directed DX E11.9 1 kit 0  . COVID-19 mRNA vaccine, Pfizer, 30 MCG/0.3ML injection INJECT AS DIRECTED .3 mL 0  . cyclobenzaprine (FLEXERIL) 10 MG tablet Take 1 tablet at bedtime. May also take additional 1/2 tablet up to twice daily as needed. 60 tablet 3  . cyclobenzaprine (FLEXERIL) 10 MG tablet TAKE 1 TABLET AT BEDTIME. MAY ALSO TAKE ADDITIONAL 1/2 TABLET UP TO TWICE DAILY AS NEEDED. 60 tablet 3  . glucose blood test strip Reported on 07/09/2015    . Lancets (ONETOUCH DELICA PLUS [id number]) MISC USE TO CHECK BLOOD SUGAR 1 TO 4 TIMES A DAY AS NEEDED AS DIRECTED 200 each 12  . Lancets (ONETOUCH ULTRASOFT) lancets Use 1-4 times daily as needed/directed  DX E11.9 200 each 12  . metFORMIN (GLUCOPHAGE-XR) 500 MG 24 hr tablet Take 1 tablet (500 mg total) by mouth 3 (three) times daily with meals. 270 tablet 3  . metFORMIN (GLUCOPHAGE-XR) 500 MG 24 hr tablet TAKE 1 TABLET BY MOUTH THREE TIMES DAILY WITH MEALS 270 tablet 3  . neomycin-polymyxin b-dexamethasone (MAXITROL) 3.5-10000-0.1 OINT APPLY 1 APPLICATION INTO THE RIGHT EYE EVERY EVENING 3.5 g 0  .  neomycin-polymyxin b-dexamethasone (MAXITROL) 3.5-10000-0.1 SUSP PLACE 1 DROP INTO THE RIGHT EYE FOUR TIMES DAILY 5 mL 0  . ONETOUCH VERIO test strip Use 1-4 times daily as directed/needed   DX E11.9 200 each 12  . ONETOUCH VERIO test strip USE 1-4 TIMES DAILY AS DIRECTED/NEEDED 200 strip 12  . OVER THE COUNTER MEDICATION Health joint    . Potassium 99 MG TABS Take by mouth.     No current facility-administered medications on file prior to visit.   Allergies  Allergen Reactions  . Pollen Extract Itching  . Citrus Itching, Rash and Swelling  . Ibuprofen Swelling and Rash    Had a reaction to ibuprofen x1 but reports she is able to take aspirin  . Other Itching, Rash and Swelling    seafood. seafood   Family History  Problem Relation Age of Onset  . Hypertension Sister        sister, daughter , mother   . Hyperlipidemia Sister   . Thyroid disease Sister   . Diabetes Daughter   . Stroke Mother   . Colon cancer Neg Hx   . Sudden death Neg Hx   . Heart attack Neg Hx   . Breast cancer Neg Hx   . Stomach cancer Neg Hx    PE: BP 128/88 (BP Location: Right Arm, Patient Position: Sitting, Cuff Size: Normal)   Pulse 72   Ht 5' 6" (1.676 m)   Wt 164 lb (74.4 kg)   SpO2 99%   BMI 26.47 kg/m  Wt Readings from Last 3 Encounters:  05/21/20 164 lb (74.4 kg)  01/16/20 176 lb 12.8 oz (80.2 kg)  12/31/19 176 lb 9.6 oz (80.1 kg)   Constitutional: normal weight, in NAD Eyes: PERRLA, EOMI, no exophthalmos ENT: moist mucous membranes, no thyromegaly, no cervical lymphadenopathy Cardiovascular: RRR, No MRG Respiratory: CTA B Gastrointestinal: abdomen soft, NT, ND, BS+ Musculoskeletal: no deformities, strength intact in all 4 Skin: moist, warm, no rashes Neurological: no tremor with outstretched hands, DTR normal in all 4  ASSESSMENT: 1. DM2, non-insulin-dependent, now more controlled, without complications  2. History of hyperparathyroidism -  status post 2 gland parathyroidectomy in  2015   3. Vit D def  4.  History of elevated B12 vitamin  PLAN:  1. Patient with controlled type 2 diabetes, on oral antidiabetic regimen, with metformin ER only.  After the coronavirus pandemic started, sugars increased, but they improved after increasing the dose of metformin to 3 times a day.  She had problems tolerating metformin ER that she obtained from [facility] (from [employer]) so I printed another prescription for her and advised her to get it from another manufacturer/pharmacy.  At last visit, she was on metformin without any intolerance.  At that time, she reduced carbohydrate intake and lost 6 pounds.  Sugars are mostly at goal.  We did not change her regimen.  HbA1c was 6.8%, only slightly higher than before. -At today's visit, sugars have improved and they are almost all at goal.  I encouraged her to continue with the dietary changes, which appear to be salutary for her.  We will not change her regimen for now. - I suggested to:  Patient Instructions  Please continue: -  Metformin ER 500 mg 3x a day.  Also, continue: - vitamin D 5000 units daily - 1 Tums a day with dinner  Please return in 5 months with your sugar log.   - we checked her HbA1c: 6.3% (better) - advised to check sugars at different times of the day - 1x a day, rotating check times - advised for yearly eye exams >> she is UTD - return to clinic in 5 months  2. History of primary hyperparathyroidism -She is status post parathyroidectomy -In 2020, she was in the emergency room with paresthesia and pain in the right arm and was found to have a minimally low calcium, at 8.7.  Afterwards, calcium improved and PTH and vitamin D levels were also normal. -In the past, she was taking Tums when she felt weak and tired, but after the above episode, I advised her to take 1 tablet daily. -Latest calcium level was reviewed from 09/2019 and this was normal -We will repeat a level at next OV  3. Vit D def -We  started 5000 units vitamin D daily -Vitamin D level was normal in 09/2019 -We will repeat this at next OV  4.  Elevated B12 vitamin -She had an undetectably high B12 level last year, however, we rechecked this in 06/2019 and the level was normal. At last visit, B12 level was normal, at 560. -We continued her off the B12 supplement -We will repeat this at next OV  Philemon Kingdom, MD PhD [facility] Endocrinology

## 2020-05-22 ENCOUNTER — Other Ambulatory Visit (HOSPITAL_BASED_OUTPATIENT_CLINIC_OR_DEPARTMENT_OTHER): Payer: Self-pay

## 2020-05-22 MED ORDER — PFIZER-BIONT COVID-19 VAC-TRIS 30 MCG/0.3ML IM SUSP
INTRAMUSCULAR | 0 refills | Status: DC
  Filled 2020-05-22: qty 0.3, 1d supply, fill #0

## 2020-05-23 ENCOUNTER — Other Ambulatory Visit (HOSPITAL_BASED_OUTPATIENT_CLINIC_OR_DEPARTMENT_OTHER): Payer: Self-pay

## 2020-05-29 DIAGNOSIS — E1165 Type 2 diabetes mellitus with hyperglycemia: Secondary | ICD-10-CM | POA: Diagnosis not present

## 2020-06-11 ENCOUNTER — Other Ambulatory Visit: Payer: Self-pay

## 2020-06-11 ENCOUNTER — Ambulatory Visit (INDEPENDENT_AMBULATORY_CARE_PROVIDER_SITE_OTHER): Payer: Medicare Other | Admitting: Family

## 2020-06-11 ENCOUNTER — Encounter: Payer: Self-pay | Admitting: Family

## 2020-06-11 VITALS — BP 133/59 | HR 74 | Temp 98.3°F | Resp 16 | Wt 161.0 lb

## 2020-06-11 DIAGNOSIS — R002 Palpitations: Secondary | ICD-10-CM | POA: Diagnosis not present

## 2020-06-11 DIAGNOSIS — R5383 Other fatigue: Secondary | ICD-10-CM | POA: Diagnosis not present

## 2020-06-11 LAB — COMPREHENSIVE METABOLIC PANEL
ALT: 15 U/L (ref 0–35)
AST: 17 U/L (ref 0–37)
Albumin: 4.5 g/dL (ref 3.5–5.2)
Alkaline Phosphatase: 71 U/L (ref 39–117)
BUN: 33 mg/dL — ABNORMAL HIGH (ref 6–23)
CO2: 28 mEq/L (ref 19–32)
Calcium: 9.2 mg/dL (ref 8.4–10.5)
Chloride: 100 mEq/L (ref 96–112)
Creatinine, Ser: 1.38 mg/dL — ABNORMAL HIGH (ref 0.40–1.20)
GFR: 39.38 mL/min — ABNORMAL LOW (ref >60.00)
Glucose, Bld: 95 mg/dL (ref 70–99)
Potassium: 4.2 mEq/L (ref 3.5–5.1)
Sodium: 138 mEq/L (ref 135–145)
Total Bilirubin: 0.3 mg/dL (ref 0.2–1.2)
Total Protein: 7.4 g/dL (ref 6.0–8.3)

## 2020-06-11 LAB — CBC WITH DIFFERENTIAL/PLATELET
Basophils Absolute: 0.1 10*3/uL (ref 0.0–0.1)
Basophils Relative: 1.1 % (ref 0.0–3.0)
Eosinophils Absolute: 0.2 10*3/uL (ref 0.0–0.7)
Eosinophils Relative: 4.1 % (ref 0.0–5.0)
HCT: 38.3 % (ref 36.0–46.0)
Hemoglobin: 12.7 g/dL (ref 12.0–15.0)
Lymphocytes Relative: 34.7 % (ref 12.0–46.0)
Lymphs Abs: 2 10*3/uL (ref 0.7–4.0)
MCHC: 33.3 g/dL (ref 30.0–36.0)
MCV: 87.2 fl (ref 78.0–100.0)
Monocytes Absolute: 0.4 10*3/uL (ref 0.1–1.0)
Monocytes Relative: 7.2 % (ref 3.0–12.0)
Neutro Abs: 3.1 10*3/uL (ref 1.4–7.7)
Neutrophils Relative %: 52.9 % (ref 43.0–77.0)
Platelets: 274 10*3/uL (ref 150.0–400.0)
RBC: 4.39 Mil/uL (ref 3.87–5.11)
RDW: 14.5 % (ref 11.5–15.5)
WBC: 5.8 10*3/uL (ref 4.0–10.5)

## 2020-06-11 LAB — URINALYSIS, ROUTINE W REFLEX MICROSCOPIC
Bilirubin Urine: NEGATIVE
Hgb urine dipstick: NEGATIVE
Ketones, ur: NEGATIVE
Leukocytes,Ua: NEGATIVE
Nitrite: NEGATIVE
RBC / HPF: NONE SEEN (ref 0–?)
Specific Gravity, Urine: 1.01 (ref 1.000–1.030)
Total Protein, Urine: NEGATIVE
Urine Glucose: NEGATIVE
Urobilinogen, UA: 0.2 (ref 0.0–1.0)
WBC, UA: NONE SEEN (ref 0–?)
pH: 6 (ref 5.0–8.0)

## 2020-06-11 LAB — TSH: TSH: 6.03 u[IU]/mL — ABNORMAL HIGH (ref 0.35–4.50)

## 2020-06-11 NOTE — Assessment & Plan Note (Addendum)
She has generalized weakness/malaise- I wonder if she may have COVID-19.  Pt was swabbed today for COVID-19. Will await results. Would like to rule out Anemia (CBC), Hypothyroid (TSH), UTI (ua/culture). Recommended 1 week follow up. She is to call sooner if new/worsening symptoms.

## 2020-06-11 NOTE — Patient Instructions (Signed)
Please complete lab work prior to leaving. We will contact you with your results and further recommendations. Stay well hydrated and get plenty of rest.

## 2020-06-11 NOTE — Assessment & Plan Note (Signed)
EKG tracing is personally reviewed.  EKG notes NSR.  (few PAC's) No acute changes.  Check CMET, TSH, CBC

## 2020-06-11 NOTE — Progress Notes (Signed)
Subjective:   By signing my name below, I, Abigail [name], attest that this documentation has been prepared under the direction and in the presence of Abigail [name]. 06/11/2020      Patient ID: Abigail [name], female    DOB: [birth year], 70 y.o.   MRN: [id number]  No chief complaint on file.   HPI Patient is in today for a office visit. She complains of feeling weakness, headaches, and having dizziness. A few times when she checked her blood pressure at home SBP was reportedly in the low 100's.  Last week she noticed herself having difficulty completing her daily routine due to fatigue/weakness. She started to measure her blood pressure and noticed it was lower than normal. She denies having any sore throat, burning, and frequency at this time. She is UTD on Covid-19 vaccines. She has not been around anyone with Covid-19 recently.   Diet- She also complains of constipation and diarrhea. Her stomachs gets upset from eating dairy. She also stopped eating carbohydrates and started to eat more protein. She gets diarrhea when she eats gluten and gluten products.   Past Medical History:  Diagnosis Date  . Asthma   . Chiari malformation type I ([place])   . Diabetes mellitus   . GERD (gastroesophageal reflux disease)   . Hx of gout   . Hyperlipidemia   . Hyperparathyroidism ([place])   . Hypertension   . Lumbar radiculopathy   . Postablative hypothyroidism   . Sickle cell anemia ([facility])   . Spondylolysis   . Thyroid disease     Past Surgical History:  Procedure Laterality Date  . ABDOMINAL HYSTERECTOMY  1994   NO oophorectomy  . BREAST BIOPSY Left 2015   [facility]   . CATARACT EXTRACTION Right 08/2019  . CERVICAL LAMINECTOMY     at C1 w/ duraplasty  . CRANIECTOMY SUBOCCIPITAL W/ CERVICAL LAMINECTOMY / CHIARI  07/05/2011   At C1, performed at [facility]  . PAR[facility]RO[facility]ECTOMY  2015   [facility]     Family History  Problem Relation Age of Onset  . Hypertension Sister        sister,  daughter , mother   . Hyperlipidemia Sister   . Thyroid disease Sister   . Diabetes Daughter   . Stroke Mother   . Colon cancer Neg Hx   . Sudden death Neg Hx   . Heart attack Neg Hx   . Breast cancer Neg Hx   . Stomach cancer Neg Hx     Social History   Socioeconomic History  . Marital status: Married    Spouse name: Not on file  . Number of children: 1  . Years of education: Not on file  . Highest education level: Not on file  Occupational History  . Occupation: Pharmacist, hospital , high school  Tobacco Use  . Smoking status: Never Smoker  . Smokeless tobacco: Never Used  Vaping Use  . Vaping Use: Never used  Substance and Sexual Activity  . Alcohol use: No  . Drug use: No  . Sexual activity: Not Currently    Partners: Male    Comment: 1st intercourse- 89, married- 61 yrs   Other Topics Concern  . Not on file  Social History Narrative   Original from [place]   Married x 8 years   1 daughter in [place]   Jehovah witness : no transfusions    Social Determinants of Health   Financial Resource Strain: Not on file  Food Insecurity: Not on file  Transportation Needs: Not on file  Physical Activity: Not on file  Stress: Not on file  Social Connections: Not on file  Intimate Partner Violence: Not on file    Outpatient Medications Prior to Visit  Medication Sig Dispense Refill  . acetaminophen (TYLENOL) 500 MG tablet Take 1,000 mg by mouth every 6 (six) hours as needed.    Marland Kitchen albuterol (VENTOLIN HFA) 108 (90 Base) MCG/ACT inhaler Inhale 2 puffs into the lungs every 6 (six) hours as needed for wheezing or shortness of breath. 18 g 1  . aspirin EC 81 MG tablet Take 81 mg by mouth daily.    . Blood Glucose Monitoring Suppl (ONE TOUCH ULTRA 2) w/Device KIT Use as advised 1 kit 0  . Blood Glucose Monitoring Suppl (ONETOUCH VERIO FLEX SYSTEM) w/Device KIT USE 1-4 TIMES DAILY AS NEEDED/DIRECTED 1 kit 0  . Blood Glucose Monitoring Suppl (ONETOUCH VERIO) w/Device KIT Use 1-4 times daily  as needed/directed DX E11.9 1 kit 0  . COVID-19 mRNA Vac-TriS, Pfizer, (PFIZER-BIONT COVID-19 VAC-TRIS) SUSP injection Inject into the muscle. 0.3 mL 0  . COVID-19 mRNA vaccine, Pfizer, 30 MCG/0.3ML injection INJECT AS DIRECTED .3 mL 0  . cyclobenzaprine (FLEXERIL) 10 MG tablet Take 1 tablet at bedtime. May also take additional 1/2 tablet up to twice daily as needed. 60 tablet 3  . cyclobenzaprine (FLEXERIL) 10 MG tablet TAKE 1 TABLET AT BEDTIME. MAY ALSO TAKE ADDITIONAL 1/2 TABLET UP TO TWICE DAILY AS NEEDED. 60 tablet 3  . glucose blood test strip Reported on 07/09/2015    . Lancets (ONETOUCH DELICA PLUS [id number]) MISC USE TO CHECK BLOOD SUGAR 1 TO 4 TIMES A DAY AS NEEDED AS DIRECTED 200 each 12  . Lancets (ONETOUCH ULTRASOFT) lancets Use 1-4 times daily as needed/directed  DX E11.9 200 each 12  . metFORMIN (GLUCOPHAGE-XR) 500 MG 24 hr tablet Take 1 tablet (500 mg total) by mouth 3 (three) times daily with meals. 270 tablet 3  . metFORMIN (GLUCOPHAGE-XR) 500 MG 24 hr tablet TAKE 1 TABLET BY MOUTH THREE TIMES DAILY WITH MEALS 270 tablet 3  . neomycin-polymyxin b-dexamethasone (MAXITROL) 3.5-10000-0.1 OINT APPLY 1 APPLICATION INTO THE RIGHT EYE EVERY EVENING 3.5 g 0  . neomycin-polymyxin b-dexamethasone (MAXITROL) 3.5-10000-0.1 SUSP PLACE 1 DROP INTO THE RIGHT EYE FOUR TIMES DAILY 5 mL 0  . ONETOUCH VERIO test strip Use 1-4 times daily as directed/needed   DX E11.9 200 each 12  . ONETOUCH VERIO test strip USE 1-4 TIMES DAILY AS DIRECTED/NEEDED 200 strip 12  . OVER THE COUNTER MEDICATION Health joint    . Potassium 99 MG TABS Take by mouth.     No facility-administered medications prior to visit.    Allergies  Allergen Reactions  . Pollen Extract Itching  . Citrus Itching, Rash and Swelling  . Ibuprofen Swelling and Rash    Had a reaction to ibuprofen x1 but reports she is able to take aspirin  . Other Itching, Rash and Swelling    seafood. seafood    Review of Systems  HENT: Negative  for sore throat.   Genitourinary: Negative for dysuria and frequency.       Objective:    Physical Exam Constitutional:      Appearance: Normal appearance.  HENT:     Head: Normocephalic and atraumatic.     Right Ear: External ear normal.     Left Ear: External ear normal.  Eyes:     Extraocular Movements: Extraocular movements intact.  Pupils: Pupils are equal, round, and reactive to light.  Neck:     Thyroid: No thyroid mass or thyromegaly.  Cardiovascular:     Pulses: Normal pulses.     Heart sounds: Normal heart sounds.  Pulmonary:     Effort: Pulmonary effort is normal. No respiratory distress.     Breath sounds: Normal breath sounds. No stridor. No wheezing, rhonchi or rales.  Abdominal:     General: Bowel sounds are normal. There is no distension.     Palpations: Abdomen is soft. There is no mass.     Tenderness: There is no abdominal tenderness. There is no guarding or rebound.     Hernia: No hernia is present.  Lymphadenopathy:     Cervical: No cervical adenopathy.  Skin:    General: Skin is warm and dry.  Neurological:     Mental Status: She is [name] and oriented to person, place, and time.  Psychiatric:        Behavior: Behavior normal.     There were no vitals taken for this visit. Wt Readings from Last 3 Encounters:  05/21/20 164 lb (74.4 kg)  01/16/20 176 lb 12.8 oz (80.2 kg)  12/31/19 176 lb 9.6 oz (80.1 kg)    Diabetic Foot Exam - Simple   No data filed    Lab Results  Component Value Date   WBC 4.6 10/17/2019   HGB 12.3 10/17/2019   HCT 38.0 10/17/2019   PLT 272 10/17/2019   GLUCOSE 131 (H) 10/17/2019   CHOL 178 10/17/2019   TRIG 63 10/17/2019   HDL 68 10/17/2019   LDLCALC 95 10/17/2019   ALT 12 10/17/2019   AST 17 10/17/2019   NA 139 10/17/2019   K 4.2 10/17/2019   CL 103 10/17/2019   CREATININE 0.93 10/17/2019   BUN 14 10/17/2019   CO2 28 10/17/2019   TSH 4.42 12/04/2019   INR 0.9 01/08/2019   HGBA1C 6.3 (A) 05/21/2020    MICROALBUR <0.2 09/01/2018    Lab Results  Component Value Date   TSH 4.42 12/04/2019   Lab Results  Component Value Date   WBC 4.6 10/17/2019   HGB 12.3 10/17/2019   HCT 38.0 10/17/2019   MCV 88.2 10/17/2019   PLT 272 10/17/2019   Lab Results  Component Value Date   NA 139 10/17/2019   K 4.2 10/17/2019   CO2 28 10/17/2019   GLUCOSE 131 (H) 10/17/2019   BUN 14 10/17/2019   CREATININE 0.93 10/17/2019   BILITOT 0.6 10/17/2019   ALKPHOS 92 01/08/2019   AST 17 10/17/2019   ALT 12 10/17/2019   PROT 6.7 10/17/2019   ALBUMIN 4.2 01/08/2019   CALCIUM 9.0 10/17/2019   ANIONGAP 11 01/08/2019   GFR 82.82 07/20/2017   Lab Results  Component Value Date   CHOL 178 10/17/2019   Lab Results  Component Value Date   HDL 68 10/17/2019   Lab Results  Component Value Date   LDLCALC 95 10/17/2019   Lab Results  Component Value Date   TRIG 63 10/17/2019   Lab Results  Component Value Date   CHOLHDL 2.6 10/17/2019   Lab Results  Component Value Date   HGBA1C 6.3 (A) 05/21/2020       Assessment & Plan:   Problem List Items Addressed This Visit   None      No orders of the defined types were placed in this encounter.   I, Abigail [name], personally preformed the services described in  this documentation.  All medical record entries made by the scribe were at my direction and in my presence.  I have reviewed the chart and discharge instructions (if applicable) and agree that the record reflects my personal performance and is accurate and complete. 06/11/2020   I,Abigail [name],acting as a Education administrator for Nance Pear, [name].,have documented all relevant documentation on the behalf of Nance Pear, [name],as directed by  Nance Pear, [name] while in the presence of Nance Pear, [name].   Abigail [employer]

## 2020-06-12 LAB — URINE CULTURE
MICRO NUMBER:: 11905585
Result:: NO GROWTH
SPECIMEN QUALITY:: ADEQUATE

## 2020-06-13 ENCOUNTER — Other Ambulatory Visit (INDEPENDENT_AMBULATORY_CARE_PROVIDER_SITE_OTHER): Payer: Medicare Other

## 2020-06-13 ENCOUNTER — Telehealth: Payer: Self-pay | Admitting: Family

## 2020-06-13 DIAGNOSIS — E079 Disorder of thyroid, unspecified: Secondary | ICD-10-CM

## 2020-06-13 LAB — T4, FREE: Free T4: 0.76 ng/dL (ref 0.60–1.60)

## 2020-06-13 LAB — NOVEL CORONAVIRUS, NAA

## 2020-06-13 LAB — TSH: TSH: 5.67 u[IU]/mL — ABNORMAL HIGH (ref 0.35–4.50)

## 2020-06-13 NOTE — Telephone Encounter (Signed)
Patient advised of results, provider's advise and additional testing. Patient was told we will call her back when we receive the additional tests.

## 2020-06-13 NOTE — Telephone Encounter (Signed)
Please contact pt and let her know that he covid test is negative.  It looks like she is likely dehydrated- please increase fluid intake. Also, it looks like her thyroid is underactive. I am going to ask the lab to run some additional thyroid tests and will likely start her on a thyroid medication once I review those results.

## 2020-06-20 ENCOUNTER — Ambulatory Visit (INDEPENDENT_AMBULATORY_CARE_PROVIDER_SITE_OTHER): Payer: Medicare Other | Admitting: Internal Medicine

## 2020-06-20 ENCOUNTER — Encounter: Payer: Self-pay | Admitting: Internal Medicine

## 2020-06-20 ENCOUNTER — Other Ambulatory Visit: Payer: Self-pay

## 2020-06-20 VITALS — BP 140/70 | HR 66 | Temp 97.8°F | Resp 16 | Ht 66.0 in | Wt 162.0 lb

## 2020-06-20 DIAGNOSIS — R5383 Other fatigue: Secondary | ICD-10-CM

## 2020-06-20 DIAGNOSIS — R634 Abnormal weight loss: Secondary | ICD-10-CM

## 2020-06-20 DIAGNOSIS — R7989 Other specified abnormal findings of blood chemistry: Secondary | ICD-10-CM

## 2020-06-20 LAB — BASIC METABOLIC PANEL
BUN: 19 mg/dL (ref 7–25)
CO2: 27 mmol/L (ref 20–32)
Calcium: 9.4 mg/dL (ref 8.6–10.4)
Chloride: 101 mmol/L (ref 98–110)
Creat: 0.86 mg/dL (ref 0.50–0.99)
Glucose, Bld: 95 mg/dL (ref 65–99)
Potassium: 4.1 mmol/L (ref 3.5–5.3)
Sodium: 139 mmol/L (ref 135–146)

## 2020-06-20 NOTE — Patient Instructions (Signed)
Check the  blood pressure   BP GOAL is between 110/65 and  135/85. If it is consistently higher or lower, let me know      GO TO THE LAB : Get the blood work     GO TO THE FRONT DESK, PLEASE SCHEDULE YOUR APPOINTMENTS Come back for  A physical exam by September
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
## 2020-06-21 NOTE — Assessment & Plan Note (Signed)
Fatigue: As described above, blood work was done and it was okay except for slight increase creatinine, she recalls she was not drinking plenty of fluids but she is now.  Overall feels better.  Check a BMP otherwise observation. Weight loss: Noted weight loss, she is eating much healthier, she is happy about it.  Praised. 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

## 2020-06-26 DIAGNOSIS — E1165 Type 2 diabetes mellitus with hyperglycemia: Secondary | ICD-10-CM | POA: Diagnosis not present

## 2020-07-07 ENCOUNTER — Encounter (INDEPENDENT_AMBULATORY_CARE_PROVIDER_SITE_OTHER): Payer: Medicare Other | Admitting: Ophthalmology

## 2020-07-07 ENCOUNTER — Other Ambulatory Visit: Payer: Self-pay

## 2020-07-07 DIAGNOSIS — H43813 Vitreous degeneration, bilateral: Secondary | ICD-10-CM | POA: Diagnosis not present

## 2020-07-07 DIAGNOSIS — E113293 Type 2 diabetes mellitus with mild nonproliferative diabetic retinopathy without macular edema, bilateral: Secondary | ICD-10-CM

## 2020-07-07 DIAGNOSIS — H35373 Puckering of macula, bilateral: Secondary | ICD-10-CM

## 2020-07-07 DIAGNOSIS — H33302 Unspecified retinal break, left eye: Secondary | ICD-10-CM

## 2020-07-08 ENCOUNTER — Other Ambulatory Visit (HOSPITAL_COMMUNITY): Payer: Self-pay

## 2020-07-08 ENCOUNTER — Other Ambulatory Visit (HOSPITAL_BASED_OUTPATIENT_CLINIC_OR_DEPARTMENT_OTHER): Payer: Self-pay

## 2020-07-08 MED ORDER — KETOROLAC TROMETHAMINE 0.5 % OP SOLN
1.0000 [drp] | Freq: Four times a day (QID) | OPHTHALMIC | 0 refills | Status: DC
  Filled 2020-07-08: qty 3, 8d supply, fill #0
  Filled 2020-07-08: qty 5, 12d supply, fill #0

## 2020-08-01 ENCOUNTER — Other Ambulatory Visit (HOSPITAL_BASED_OUTPATIENT_CLINIC_OR_DEPARTMENT_OTHER): Payer: Self-pay

## 2020-08-01 MED FILL — Glucose Blood Test Strip: 50 days supply | Qty: 200 | Fill #1 | Status: AC

## 2020-09-11 ENCOUNTER — Other Ambulatory Visit (HOSPITAL_BASED_OUTPATIENT_CLINIC_OR_DEPARTMENT_OTHER): Payer: Self-pay

## 2020-09-11 ENCOUNTER — Ambulatory Visit
Admission: EM | Admit: 2020-09-11 | Discharge: 2020-09-11 | Disposition: A | Discharge status: Home / Self Care | Payer: Medicare Other | Attending: Emergency Medicine | Admitting: Emergency Medicine

## 2020-09-11 ENCOUNTER — Encounter: Payer: Self-pay | Admitting: Emergency Medicine

## 2020-09-11 ENCOUNTER — Other Ambulatory Visit: Payer: Self-pay

## 2020-09-11 DIAGNOSIS — M542 Cervicalgia: Secondary | ICD-10-CM | POA: Diagnosis not present

## 2020-09-11 DIAGNOSIS — M546 Pain in thoracic spine: Secondary | ICD-10-CM | POA: Diagnosis not present

## 2020-09-11 DIAGNOSIS — R0789 Other chest pain: Secondary | ICD-10-CM | POA: Diagnosis not present

## 2020-09-11 MED ORDER — KETOROLAC TROMETHAMINE 30 MG/ML IJ SOLN
30.0000 mg | Freq: Once | INTRAMUSCULAR | Status: AC
  Administered 2020-09-11: 30 mg via INTRAMUSCULAR

## 2020-09-11 MED ORDER — DICLOFENAC SODIUM 2 % EX SOLN
2.0000 | Freq: Two times a day (BID) | CUTANEOUS | 0 refills | Status: DC
  Filled 2020-09-11: qty 112, 30d supply, fill #0

## 2020-09-11 MED ORDER — CELECOXIB 100 MG PO CAPS
100.0000 mg | ORAL_CAPSULE | Freq: Two times a day (BID) | ORAL | 0 refills | Status: DC
  Filled 2020-09-11: qty 20, 10d supply, fill #0

## 2020-09-11 NOTE — ED Triage Notes (Signed)
Neck pain that has been getting worse for a few weeks, pain radiating down back and arms.

## 2020-09-11 NOTE — Discharge Instructions (Signed)
We gave you a shot of Toradol May continue with muscle relaxer and topical diclofenac/Pennsaid today May trial Celebrex tomorrow with food Follow-up with orthopedics/primary care on Monday as planned Please follow-up if developing any headaches, vision changes, dizziness, lightheadedness, weakness or worsening chest pain.

## 2020-09-11 NOTE — ED Provider Notes (Signed)
[facility]    CSN: [id number] Arrival date & time: 09/11/20  1052      History   Chief Complaint Chief Complaint  Patient presents with   Neck Pain    HPI Abigail [name] is a 70 y.o. female history of DM type II, Chiari malformation, hypertension, hyperlipidemia, presenting today for evaluation of neck pain..  Reports history of prior cervical decompression in 2013.  Has had intermittent problems with her neck over the past years.  Over the past week has had worsening discomfort in her neck back and is radiating into her shoulders.  Also notes that she has problems with her right shoulder which she recently received a injection for, was told that she needed surgery for this.  She reports some paresthesias into bilateral hands/fingers.  Slightly worse on the right.  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  Denies any new injury or trauma.  Taking Tylenol and ibuprofen without for relief.  Also previously prescribed muscle relaxer without full relief with this.  Using soft neck brace intermittently.  HPI  Past Medical History:  Diagnosis Date   Asthma    Chiari malformation type I ([place])    Diabetes mellitus    GERD (gastroesophageal reflux disease)    Hx of gout    Hyperlipidemia    Hyperparathyroidism ([place])    Hypertension    Lumbar radiculopathy    Postablative hypothyroidism    Sickle cell anemia ([place])    Spondylolysis    Thyroid disease     Patient Active Problem List   Diagnosis Date Noted   Palpitations 06/11/2020   Fatigue 06/11/2020   Cervical myofascial pain syndrome 03/07/2019   Reactive depression 08/09/2017   Somatic complaints, multiple 08/09/2017   Annual physical exam 08/27/2015   Controlled type 2 diabetes mellitus without complication, without long-term current use of insulin ([place]) 05/06/2015   H/o Primary hyperparathyroidism ([place]) 05/06/2015   PCP NOTES >>>>> 01/23/2015   Chiari malformation type I ([place])     Sickle cell anemia (HCC)    Lumbar radiculopathy    Spondylolysis    Hx of gout    Thyroid disorder 01/02/2015   Allergic rhinitis 01/02/2015    Past Surgical History:  Procedure Laterality Date   ABDOMINAL HYSTERECTOMY  1994   NO oophorectomy   BREAST BIOPSY Left 2015   [facility]    CATARACT EXTRACTION Right 08/2019   CERVICAL LAMINECTOMY     at C1 w/ duraplasty   CRANIECTOMY SUBOCCIPITAL W/ CERVICAL LAMINECTOMY / CHIARI  07/05/2011   At C1, [facility]  2015   [facility]     OB History     Gravida  1   Para  1   Term      Preterm      AB      Living  1      SAB      IAB      Ectopic      Multiple      Live Births  1            Home Medications    Prior to Admission medications   Medication Sig Start Date End Date Taking? Authorizing Provider  celecoxib (CELEBREX) 100 MG capsule Take 1 capsule (100 mg total) by mouth 2 (two) times daily. 09/11/20  Yes Averleigh Savary C, PA-C  Diclofenac Sodium (PENNSAID) 2 % SOLN Apply 2 Pump topically 2 (two) times daily. 09/11/20  Yes  Albie Arizpe C, PA-C  acetaminophen (TYLENOL) 500 MG tablet Take 1,000 mg by mouth every 6 (six) hours as needed.    [provider]  albuterol (VENTOLIN HFA) 108 (90 Base) MCG/ACT inhaler Inhale 2 puffs into the lungs every 6 (six) hours as needed for wheezing or shortness of breath. 03/05/19   Colon Branch, MD  aspirin EC 81 MG tablet Take 81 mg by mouth daily.    [provider]  Blood Glucose Monitoring Suppl (ONE TOUCH ULTRA 2) w/Device KIT Use as advised 01/16/19   Philemon Kingdom, MD  Blood Glucose Monitoring Suppl ([place]) w/Device KIT USE 1-4 TIMES DAILY AS NEEDED/DIRECTED 11/02/19 11/01/20  Philemon Kingdom, MD  Blood Glucose Monitoring Suppl (ONETOUCH VERIO) w/Device KIT Use 1-4 times daily as needed/directed DX E11.9 11/02/19   Philemon Kingdom, MD  Cholecalciferol (VITAMIN D3) 1.25 MG (50000 UT) TABS Take by mouth.     [provider]  COVID-19 mRNA Vac-TriS, Pfizer, (PFIZER-BIONT COVID-19 VAC-TRIS) SUSP injection Inject into the muscle. 05/20/20   Carlyle Basques, MD  COVID-19 mRNA vaccine, [place], 30 MCG/0.3ML injection INJECT AS DIRECTED 11/09/19 11/08/20  Carlyle Basques, MD  cyclobenzaprine (FLEXERIL) 10 MG tablet Take 1 tablet at bedtime. May also take additional 1/2 tablet up to twice daily as needed. 12/31/19   Lomax, Amy, NP  cyclobenzaprine (FLEXERIL) 10 MG tablet TAKE 1 TABLET AT BEDTIME. MAY ALSO TAKE ADDITIONAL 1/2 TABLET UP TO TWICE DAILY AS NEEDED. 12/31/19 12/30/20  Lomax, Amy, NP  fexofenadine (ALLEGRA) 180 MG tablet Take 180 mg by mouth daily.    [provider]  glucose blood test strip Reported on 07/09/2015 06/06/11   [provider]  ketorolac (ACULAR) 0.5 % ophthalmic solution Place 1 drop into both eyes 4 (four) times daily for 5 days then discontinue 07/08/20     Lancets (ONETOUCH DELICA PLUS [id number]) MISC USE TO CHECK BLOOD SUGAR 1 TO 4 TIMES A DAY AS NEEDED AS DIRECTED 11/02/19 11/01/20  Philemon Kingdom, MD  Lancets [facility] ULTRASOFT) lancets Use 1-4 times daily as needed/directed  DX E11.9 11/02/19   Philemon Kingdom, MD  metFORMIN (GLUCOPHAGE-XR) 500 MG 24 hr tablet Take 1 tablet (500 mg total) by mouth 3 (three) times daily with meals. 10/26/19   Philemon Kingdom, MD  metFORMIN (GLUCOPHAGE-XR) 500 MG 24 hr tablet TAKE 1 TABLET BY MOUTH THREE TIMES DAILY WITH MEALS 10/26/19 10/25/20  Philemon Kingdom, MD  neomycin-polymyxin b-dexamethasone (MAXITROL) 3.5-10000-0.1 OINT APPLY 1 APPLICATION INTO THE RIGHT EYE EVERY EVENING 12/04/19 12/03/20  Hortencia Pilar, MD  neomycin-polymyxin b-dexamethasone (MAXITROL) 3.5-10000-0.1 SUSP PLACE 1 DROP INTO THE RIGHT EYE FOUR TIMES DAILY 12/04/19 12/03/20  Hortencia Pilar, MD  [facility] VERIO test strip Use 1-4 times daily as directed/needed   DX E11.9 11/02/19   Philemon Kingdom, MD  [facility] VERIO test strip USE 1-4  TIMES DAILY AS DIRECTED/NEEDED Patient taking differently: USE 1-4 TIMES DAILY AS DIRECTED/NEEDED 11/02/19 11/01/20  Philemon Kingdom, MD  [place] joint    [provider]  Potassium 99 MG TABS Take by mouth.    [provider]    Family History Family History  Problem Relation Age of Onset   Hypertension Sister        sister, daughter , mother    Hyperlipidemia Sister    Thyroid disease Sister    Diabetes Daughter    Stroke Mother    Colon cancer Neg Hx    Sudden death Neg Hx  Heart attack Neg Hx    Breast cancer Neg Hx    Stomach cancer Neg Hx     Social History Social History   Tobacco Use   Smoking status: Never   Smokeless tobacco: Never  Vaping Use   Vaping Use: Never used  Substance Use Topics   Alcohol use: No   Drug use: No     Allergies   Pollen extract, Citrus, Ibuprofen, and Other   Review of Systems Review of Systems  Constitutional:  Negative for fatigue and fever.  HENT:  Negative for mouth sores.   Eyes:  Negative for visual disturbance.  Respiratory:  Negative for shortness of breath.   Cardiovascular:  Negative for chest pain.  Gastrointestinal:  Negative for abdominal pain, nausea and vomiting.  Genitourinary:  Negative for genital sores.  Musculoskeletal:  Positive for back pain, myalgias and neck pain. Negative for arthralgias and joint swelling.  Skin:  Negative for color change, rash and wound.  Neurological:  Negative for dizziness, weakness, light-headedness and headaches.    Physical Exam Triage Vital Signs ED Triage Vitals  Enc Vitals Group     BP      Pulse      Resp      Temp      Temp src      SpO2      Weight      Height      Head Circumference      Peak Flow      Pain Score      Pain Loc      Pain Edu?      Excl. in [place]?    No data found.  Updated Vital Signs BP (!) 153/74 (BP Location: Left Arm)   Pulse 61   Temp 98.3 F (36.8 C) (Oral)   Resp 18   Wt 160 lb  (72.6 kg)   SpO2 97%   BMI 25.82 kg/m   Visual Acuity Right Eye Distance:   Left Eye Distance:   Bilateral Distance:    Right Eye Near:   Left Eye Near:    Bilateral Near:     Physical Exam Vitals and nursing note reviewed.  Constitutional:      Appearance: She is well-developed.     Comments: No acute distress  HENT:     Head: Normocephalic and atraumatic.     Nose: Nose normal.  Eyes:     Conjunctiva/sclera: Conjunctivae normal.  Cardiovascular:     Rate and Rhythm: Normal rate and regular rhythm.  Pulmonary:     Effort: Pulmonary effort is normal. No respiratory distress.     Comments: Breathing comfortably at rest, CTABL, no wheezing, rales or other adventitious sounds auscultated  Abdominal:     General: There is no distension.  Musculoskeletal:        General: Normal range of motion.     Cervical back: Neck supple.     Comments: Diffuse tenderness throughout mid to lower cervical spine, thoracic spine midline, no palpable deformity or step-off, diffuse tenderness throughout bilateral cervical, superior trapezius and thoracic musculature bilaterally, full active range of motion of shoulders, strength 5/Chronicle bilaterally, grip strength 5/Chronicle bilaterally, radial pulse 2+ bilaterally  Hip strength 3/5 bilaterally, patellar reflex 2+ bilaterally  Diffuse chest tenderness  Skin:    General: Skin is warm and dry.  Neurological:     Mental Status: She is alert and oriented to person, place, and time.     UC Treatments / Results  Labs (all labs ordered are listed, but only abnormal results are displayed) Labs Reviewed - No data to display  EKG   Radiology No results found.  Procedures Procedures (including critical care time)  Medications Ordered in UC Medications  ketorolac (TORADOL) 30 MG/ML injection 30 mg (has no administration in time range)    Initial Impression / Assessment and Plan / UC Course  I have reviewed the triage vital signs  and the nursing notes.  Pertinent labs & imaging results that were available during my care of the patient were reviewed by me and considered in my medical decision making (see chart for details).    Patient with diffuse cervical, thoracic pain, chest pain, may be stemming from history of prior cervical spine problems, underlying degenerative disc disease, possible cervical radiculopathy, no new mechanism of injury, no red flags, deferring any imaging at this time.  Recommending continued treatment with anti-inflammatories, gentle stretching, discussed with patient options, would like to avoid any steroids.  We will proceed with Toradol today and continue with topical Pennsaid.  May trial Celebrex as well.  Follow-up with orthopedics and primary care for further evaluation and treatment.  Discussed strict return precautions. Patient verbalized understanding and is agreeable with plan.   Final Clinical Impressions(s) / UC Diagnoses   Final diagnoses:  Neck pain, bilateral  Acute bilateral thoracic back pain  Chest wall pain     Discharge Instructions      We gave you a shot of Toradol May continue with muscle relaxer and topical diclofenac/Pennsaid today May trial Celebrex tomorrow with food Follow-up with orthopedics/primary care on Monday as planned Please follow-up if developing any headaches, vision changes, dizziness, lightheadedness, weakness or worsening chest pain.     ED Prescriptions     Medication Sig Dispense Auth. Provider   celecoxib (CELEBREX) 100 MG capsule Take 1 capsule (100 mg total) by mouth 2 (two) times daily. 20 capsule Dailon Sheeran C, PA-C   Diclofenac Sodium (PENNSAID) 2 % SOLN Apply 2 Pump topically 2 (two) times daily. 112 g Donyell Carrell, Justice C, PA-C      PDMP not reviewed this encounter.   Janith Lima, PA-C 09/11/20 1205

## 2020-09-15 ENCOUNTER — Ambulatory Visit (INDEPENDENT_AMBULATORY_CARE_PROVIDER_SITE_OTHER): Payer: Medicare Other | Admitting: Internal Medicine

## 2020-09-15 ENCOUNTER — Encounter: Payer: Self-pay | Admitting: Internal Medicine

## 2020-09-15 ENCOUNTER — Other Ambulatory Visit: Payer: Self-pay

## 2020-09-15 ENCOUNTER — Other Ambulatory Visit (HOSPITAL_BASED_OUTPATIENT_CLINIC_OR_DEPARTMENT_OTHER): Payer: Self-pay

## 2020-09-15 VITALS — BP 126/74 | HR 56 | Temp 98.5°F | Resp 18 | Ht 66.0 in | Wt 161.4 lb

## 2020-09-15 DIAGNOSIS — Z1231 Encounter for screening mammogram for malignant neoplasm of breast: Secondary | ICD-10-CM | POA: Diagnosis not present

## 2020-09-15 DIAGNOSIS — M7918 Myalgia, other site: Secondary | ICD-10-CM | POA: Diagnosis not present

## 2020-09-15 MED ORDER — PREGABALIN 50 MG PO CAPS
50.0000 mg | ORAL_CAPSULE | Freq: Two times a day (BID) | ORAL | 1 refills | Status: DC
  Filled 2020-09-15: qty 60, 30d supply, fill #0

## 2020-09-15 MED ORDER — CELECOXIB 100 MG PO CAPS
100.0000 mg | ORAL_CAPSULE | Freq: Every day | ORAL | 0 refills | Status: DC | PRN
  Filled 2020-09-15: qty 30, 30d supply, fill #0

## 2020-09-15 NOTE — Patient Instructions (Addendum)
Your mammogram is due September 2022- I have placed an order if you want to stop downstairs ([address]) to get on their schedule.  We are referring you to the pain clinic  In the meantime recommend Lyrica 50 mg: 1 tablet twice a day.  If it cause some drowsiness okay to take 1 at bedtime only  Celebrex 100 mg: Take only 1 tablet daily, always with food in the stomach. If you continue with some nausea or if you notice any stomach pain, change in the color of the stools: Stop the Celebrex.  You have an appointment with me in October.

## 2020-09-15 NOTE — Assessment & Plan Note (Signed)
Symptoms as described above, last imaging was December 2020:  MRI cervical spine and MRI brain done 1. Unchanged appearance of the brain status post suboccipital decompression for Chiari I malformation. No acute intracranial abnormality. 2. Stable to slight progression of cervical disc and facet degeneration resulting in up to mild spinal stenosis and moderate neural foraminal stenosis as above. Currently Celebrex is helping but is causing some GI side effects. Reportedly Flexeril, gabapentin helped in the past but caused some drowsiness. Previously declined prednisone On reviewing the chart, neurology recommended before possibly dry needling, Lyrica, Cymbalta or pain management.  All of the above discussed with the patient. Plan: Lyrica 50 mg: 1 tablet BID Decrease Celebrex 100 mg to 1 tablet a day. Refer to pain mngmt
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
## 2020-09-22 DIAGNOSIS — Z9189 Other specified personal risk factors, not elsewhere classified: Secondary | ICD-10-CM

## 2020-09-22 DIAGNOSIS — Z5320 Procedure and treatment not carried out because of patient's decision for unspecified reasons: Secondary | ICD-10-CM

## 2020-09-22 NOTE — Progress Notes (Signed)
[facility])                                            [facility] Quality Pharmacy Team                                        Statin Quality Measure Assessment    09/22/2020  Lavren Lewan 13-Feb-1950 [id number]  Per review of chart and payor information, this patient has been flagged for non-adherence to the following CMS Quality Measure:   [x]  Statin Use in Persons with Diabetes  []  Statin Use in Persons with Cardiovascular Disease  The 10-year ASCVD risk score DC Jr., et al., 2013) is: 12.6%   Values used to calculate the score:     Age: 70 years     Sex: Female     Is Non-Hispanic African American: No     Diabetic: Yes     Tobacco smoker: No     Systolic Blood Pressure: 126 mmHg     Is BP treated: No     HDL Cholesterol: 68 mg/dL     Total Cholesterol: 178 mg/dL LDL 95 mg/dL 2014  Currently prescribed statin:  []  Yes [x]  No     Comments: Per prior documentation, patient was previously on atorvastatin and it was noted that she stopped taking the medication. Per discussion with patient, she stopped atorvastatin, on her own, due having improved lipid panel. She declines statin at this time and prefers dietary changes to lower cholesterol. I provided education re: the importance of statins. Of note, she is interested in natural remedies/ OTC options. I briefly discussion the availability of OTC options, but deferred that discussion to be had with her PCP.   Thank you for your time, 75, PharmD Clinical Pharmacist [facility] Cell: [phone]

## 2020-09-22 NOTE — Progress Notes (Signed)
[facility])                                            [facility] Quality Pharmacy Team                                        Statin Quality Measure Assessment    09/22/2020  Abigail [name] Oct 02, 1950 [id number]  Per review of chart and payor information, this patient has been flagged for non-adherence to the following CMS Quality Measure:   [x]  Statin Use in Persons with Diabetes  []  Statin Use in Persons with Cardiovascular Disease  The 10-year ASCVD risk score DC Jr., et al., 2013) is: 12.6%   Values used to calculate the score:     Age: 70 years     Sex: Female     Is Non-Hispanic African American: No     Diabetic: Yes     Tobacco smoker: No     Systolic Blood Pressure: 126 mmHg     Is BP treated: No     HDL Cholesterol: 68 mg/dL     Total Cholesterol: 178 mg/dL LDL 95 mg/dL 2014  Currently prescribed statin:  []  Yes [x]  No     Comments: Per prior documentation, patient was previously on atorvastatin and it was noted that she stopped taking the medication. Per discussion with patient, she stopped atorvastatin, on her own, due having improved lipid panel. She declines statin at this time and prefers dietary changes to lower cholesterol. I provided education re: the importance of statins. Of note, she is interested in natural remedies/ OTC options. I briefly discussion the availability of OTC options, but deferred that discussion to be had with her PCP.   Thank you for your time, 75, PharmD Clinical Pharmacist [facility] Cell: [phone]   Addendum 09/22/2020 @ 1330: This was a telephone encounter that I could not addend the visit type.

## 2020-09-23 ENCOUNTER — Encounter: Payer: Self-pay | Admitting: Internal Medicine

## 2020-09-23 ENCOUNTER — Ambulatory Visit (INDEPENDENT_AMBULATORY_CARE_PROVIDER_SITE_OTHER): Payer: Medicare Other | Admitting: Internal Medicine

## 2020-09-23 ENCOUNTER — Other Ambulatory Visit (HOSPITAL_BASED_OUTPATIENT_CLINIC_OR_DEPARTMENT_OTHER): Payer: Self-pay

## 2020-09-23 ENCOUNTER — Other Ambulatory Visit: Payer: Self-pay

## 2020-09-23 VITALS — BP 128/88 | HR 85 | Ht 66.0 in | Wt 161.8 lb

## 2020-09-23 DIAGNOSIS — E119 Type 2 diabetes mellitus without complications: Secondary | ICD-10-CM

## 2020-09-23 DIAGNOSIS — E210 Primary hyperparathyroidism: Secondary | ICD-10-CM

## 2020-09-23 DIAGNOSIS — R748 Abnormal levels of other serum enzymes: Secondary | ICD-10-CM

## 2020-09-23 DIAGNOSIS — E559 Vitamin D deficiency, unspecified: Secondary | ICD-10-CM

## 2020-09-23 LAB — POCT GLYCOSYLATED HEMOGLOBIN (HGB A1C): Hemoglobin A1C: 6.2 % — AB (ref 4.0–5.6)

## 2020-09-23 MED ORDER — METFORMIN HCL ER 500 MG PO TB24
ORAL_TABLET | Freq: Three times a day (TID) | ORAL | 3 refills | Status: DC
  Filled 2020-09-23: qty 270, 90d supply, fill #0
  Filled 2020-12-24: qty 270, 90d supply, fill #1
  Filled 2021-03-20: qty 270, 90d supply, fill #2
  Filled 2021-06-24: qty 270, 90d supply, fill #3

## 2020-09-23 MED ORDER — REPAGLINIDE 1 MG PO TABS
1.0000 mg | ORAL_TABLET | Freq: Every day | ORAL | 3 refills | Status: DC
  Filled 2020-09-23: qty 90, 90d supply, fill #0

## 2020-09-23 NOTE — Patient Instructions (Addendum)
Please continue: -  Metformin ER 500 mg 3x a day  Please use: - Prandin 1 mg before a larger meal  Also, continue: - vitamin D 5000 units daily - 1 Tums a day with dinner  Please return in 4 months with your sugar log.
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
## 2020-09-24 ENCOUNTER — Other Ambulatory Visit (HOSPITAL_BASED_OUTPATIENT_CLINIC_OR_DEPARTMENT_OTHER): Payer: Self-pay

## 2020-10-02 ENCOUNTER — Other Ambulatory Visit: Payer: Self-pay | Admitting: Internal Medicine

## 2020-10-16 ENCOUNTER — Other Ambulatory Visit (HOSPITAL_BASED_OUTPATIENT_CLINIC_OR_DEPARTMENT_OTHER): Payer: Self-pay | Admitting: Physician Assistant

## 2020-10-16 ENCOUNTER — Ambulatory Visit (HOSPITAL_BASED_OUTPATIENT_CLINIC_OR_DEPARTMENT_OTHER)
Admission: RE | Admit: 2020-10-16 | Discharge: 2020-10-16 | Disposition: A | Discharge status: Home / Self Care | Payer: Medicare Other | Source: Ambulatory Visit | Attending: Physician Assistant | Admitting: Physician Assistant

## 2020-10-16 ENCOUNTER — Encounter: Payer: Medicare Other | Admitting: Internal Medicine

## 2020-10-16 ENCOUNTER — Other Ambulatory Visit: Payer: Self-pay

## 2020-10-16 DIAGNOSIS — M545 Low back pain, unspecified: Secondary | ICD-10-CM | POA: Diagnosis not present

## 2020-10-16 DIAGNOSIS — M549 Dorsalgia, unspecified: Secondary | ICD-10-CM

## 2020-10-16 DIAGNOSIS — Z79899 Other long term (current) drug therapy: Secondary | ICD-10-CM | POA: Diagnosis not present

## 2020-10-16 DIAGNOSIS — G894 Chronic pain syndrome: Secondary | ICD-10-CM | POA: Diagnosis not present

## 2020-10-16 DIAGNOSIS — M546 Pain in thoracic spine: Secondary | ICD-10-CM | POA: Diagnosis not present

## 2020-10-16 DIAGNOSIS — M542 Cervicalgia: Secondary | ICD-10-CM | POA: Diagnosis not present

## 2020-10-16 DIAGNOSIS — M4722 Other spondylosis with radiculopathy, cervical region: Secondary | ICD-10-CM | POA: Diagnosis not present

## 2020-10-16 DIAGNOSIS — Z79891 Long term (current) use of opiate analgesic: Secondary | ICD-10-CM | POA: Diagnosis not present

## 2020-10-24 ENCOUNTER — Ambulatory Visit: Payer: Medicare Other | Admitting: Internal Medicine

## 2020-10-28 ENCOUNTER — Other Ambulatory Visit (HOSPITAL_BASED_OUTPATIENT_CLINIC_OR_DEPARTMENT_OTHER): Payer: Self-pay

## 2020-10-28 DIAGNOSIS — G5622 Lesion of ulnar nerve, left upper limb: Secondary | ICD-10-CM | POA: Diagnosis not present

## 2020-10-28 DIAGNOSIS — G5603 Carpal tunnel syndrome, bilateral upper limbs: Secondary | ICD-10-CM | POA: Diagnosis not present

## 2020-10-28 MED ORDER — ACETAMINOPHEN-CODEINE #3 300-30 MG PO TABS
ORAL_TABLET | ORAL | 1 refills | Status: DC
  Filled 2020-10-28: qty 15, 4d supply, fill #0

## 2020-10-28 MED ORDER — IBUPROFEN 600 MG PO TABS
ORAL_TABLET | ORAL | 1 refills | Status: DC
  Filled 2020-10-28: qty 30, 5d supply, fill #0

## 2020-11-03 ENCOUNTER — Telehealth (HOSPITAL_BASED_OUTPATIENT_CLINIC_OR_DEPARTMENT_OTHER): Payer: Self-pay

## 2020-11-04 ENCOUNTER — Other Ambulatory Visit: Payer: Self-pay | Admitting: Internal Medicine

## 2020-11-05 ENCOUNTER — Other Ambulatory Visit (HOSPITAL_BASED_OUTPATIENT_CLINIC_OR_DEPARTMENT_OTHER): Payer: Self-pay

## 2020-11-05 MED ORDER — ONETOUCH VERIO VI STRP
ORAL_STRIP | 12 refills | Status: DC
  Filled 2020-11-05: qty 200, 50d supply, fill #0
  Filled 2021-03-16: qty 200, 50d supply, fill #1
  Filled 2021-06-17: qty 200, 50d supply, fill #2
  Filled 2021-08-03: qty 200, 50d supply, fill #3

## 2020-11-07 ENCOUNTER — Ambulatory Visit (INDEPENDENT_AMBULATORY_CARE_PROVIDER_SITE_OTHER): Payer: Medicare Other | Admitting: Internal Medicine

## 2020-11-07 ENCOUNTER — Other Ambulatory Visit: Payer: Self-pay | Admitting: Internal Medicine

## 2020-11-07 ENCOUNTER — Other Ambulatory Visit: Payer: Self-pay

## 2020-11-07 ENCOUNTER — Other Ambulatory Visit (HOSPITAL_BASED_OUTPATIENT_CLINIC_OR_DEPARTMENT_OTHER): Payer: Self-pay

## 2020-11-07 ENCOUNTER — Encounter: Payer: Self-pay | Admitting: Internal Medicine

## 2020-11-07 VITALS — BP 116/84 | HR 98 | Temp 98.4°F | Resp 16 | Ht 66.0 in | Wt 160.4 lb

## 2020-11-07 DIAGNOSIS — Z0000 Encounter for general adult medical examination without abnormal findings: Secondary | ICD-10-CM | POA: Diagnosis not present

## 2020-11-07 DIAGNOSIS — E079 Disorder of thyroid, unspecified: Secondary | ICD-10-CM

## 2020-11-07 DIAGNOSIS — Z23 Encounter for immunization: Secondary | ICD-10-CM

## 2020-11-07 DIAGNOSIS — E785 Hyperlipidemia, unspecified: Secondary | ICD-10-CM

## 2020-11-07 MED ORDER — PREGABALIN 100 MG PO CAPS
100.0000 mg | ORAL_CAPSULE | Freq: Every day | ORAL | 3 refills | Status: DC
  Filled 2020-11-07: qty 90, 90d supply, fill #0

## 2020-11-07 MED ORDER — ONETOUCH DELICA PLUS LANCET33G MISC
12 refills | Status: DC
  Filled 2020-11-07: qty 200, 50d supply, fill #0
  Filled 2021-05-20: qty 200, 50d supply, fill #1
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
## 2020-11-07 NOTE — Patient Instructions (Addendum)
Constipation: MiraLAX 17 g every day until you have a bowel movement. Seek medical attention if abdominal pain, nausea, vomiting.  For dental pain only Tylenol  Vaccines are recommended: COVID-vaccine booster.    GO TO THE LAB : Get the blood work     GO TO THE FRONT DESK, PLEASE SCHEDULE YOUR APPOINTMENTS Come back for physical exam in 1 year

## 2020-11-08 ENCOUNTER — Encounter: Payer: Self-pay | Admitting: Internal Medicine

## 2020-11-08 LAB — LIPID PANEL
Cholesterol: 201 mg/dL — ABNORMAL HIGH (ref <200)
HDL: 80 mg/dL (ref >=50)
LDL Cholesterol (Calc): 105 mg/dL (calc) — ABNORMAL HIGH
Non-HDL Cholesterol (Calc): 121 mg/dL (calc) (ref <130)
Total CHOL/HDL Ratio: 2.5 (calc) (ref <5.0)
Triglycerides: 75 mg/dL (ref <150)

## 2020-11-08 LAB — TSH: TSH: 3.63 mIU/L (ref 0.40–4.50)

## 2020-11-08 LAB — T4, FREE: Free T4: 1 ng/dL (ref 0.8–1.8)

## 2020-11-08 NOTE — Assessment & Plan Note (Signed)
--   Td 04-2015 - pnm 23: 2016;  prevnar: 2019 - S/p shingrix - s/p C-19 vaccines , booster rec w/ bivalent vax   -  flu shot today -- Female care:   Saw gynecology 11/08/2019  MMG: 09-2019 ([facility]), scheduled for [facility] next month --[facility]: 07-2012 , Dr Norma Fredrickson, per report no polyps, next cscope 10 years, was rec to start stool testing 2019. IFOB (-) 09-2019, rx iFOB again --T score -0.9 (04-2015) --Labs:  FLP, TSH, free T4, I fob --Diet and exercise discussed.    -Advance care planning: Documents scanned

## 2020-11-08 NOTE — Assessment & Plan Note (Signed)
Here for CPX DM: Per Endo High cholesterol: Checking lipid panel, she is unwilling to take cholesterol medications but is willing to work on diet. Thyroid disease: Checking TFTs Cervical myofascial pain: Reportedly saw pain management, will get records, I prescribed Lyrica 50 mg twice daily, intolerant to Lyrica in the morning due to somnolence.  Recommend Lyrica 100 mg nightly.  Overall pain is under better control.  Constipation: Taking a painkiller and ibuprofen due to recent dental procedure, recommend to discontinue both , take MiraLAX, pain control with Tylenol only. RTC 1 year

## 2020-11-10 ENCOUNTER — Ambulatory Visit (HOSPITAL_BASED_OUTPATIENT_CLINIC_OR_DEPARTMENT_OTHER): Payer: Medicare Other

## 2020-11-11 ENCOUNTER — Ambulatory Visit (HOSPITAL_BASED_OUTPATIENT_CLINIC_OR_DEPARTMENT_OTHER): Payer: Medicare Other

## 2020-11-20 ENCOUNTER — Other Ambulatory Visit (HOSPITAL_COMMUNITY): Payer: Self-pay

## 2020-11-20 ENCOUNTER — Ambulatory Visit: Payer: Medicare Other | Attending: Internal Medicine

## 2020-11-20 DIAGNOSIS — M47814 Spondylosis without myelopathy or radiculopathy, thoracic region: Secondary | ICD-10-CM | POA: Diagnosis not present

## 2020-11-20 DIAGNOSIS — M4727 Other spondylosis with radiculopathy, lumbosacral region: Secondary | ICD-10-CM | POA: Diagnosis not present

## 2020-11-20 DIAGNOSIS — Z23 Encounter for immunization: Secondary | ICD-10-CM

## 2020-11-20 DIAGNOSIS — M4722 Other spondylosis with radiculopathy, cervical region: Secondary | ICD-10-CM | POA: Diagnosis not present

## 2020-11-20 DIAGNOSIS — G894 Chronic pain syndrome: Secondary | ICD-10-CM | POA: Diagnosis not present

## 2020-11-20 NOTE — Progress Notes (Signed)
   Covid-19 Vaccination Clinic  Name:  Abigail [name]    MRN: [id number] DOB: 03-18-1950  11/20/2020  [name] was observed post Covid-19 immunization for 15 minutes without incident. She was provided with Vaccine Information Sheet and instruction to access the V-Safe system.   [name] was instructed to call 911 with any severe reactions post vaccine: Difficulty breathing  Swelling of face and throat  A fast heartbeat  A bad rash all over body  Dizziness and weakness   Immunizations Administered     Name Date Dose VIS Date Route   Pfizer Covid-19 Vaccine Bivalent Booster 11/20/2020  1:05 PM 0.3 mL 09/24/2020 Intramuscular   Manufacturer: [employer], [employer]   Lot: [id number]   NDC: [phone]

## 2020-11-21 ENCOUNTER — Other Ambulatory Visit (INDEPENDENT_AMBULATORY_CARE_PROVIDER_SITE_OTHER): Payer: Medicare Other

## 2020-11-21 DIAGNOSIS — Z0000 Encounter for general adult medical examination without abnormal findings: Secondary | ICD-10-CM

## 2020-11-21 LAB — FECAL OCCULT BLOOD, IMMUNOCHEMICAL: Fecal Occult Bld: NEGATIVE

## 2020-11-26 ENCOUNTER — Other Ambulatory Visit (HOSPITAL_BASED_OUTPATIENT_CLINIC_OR_DEPARTMENT_OTHER): Payer: Self-pay

## 2020-11-26 MED ORDER — PENICILLIN V POTASSIUM 500 MG PO TABS
ORAL_TABLET | ORAL | 0 refills | Status: DC
  Filled 2020-11-26: qty 30, 10d supply, fill #0

## 2020-12-02 ENCOUNTER — Other Ambulatory Visit (HOSPITAL_BASED_OUTPATIENT_CLINIC_OR_DEPARTMENT_OTHER): Payer: Self-pay

## 2020-12-03 ENCOUNTER — Other Ambulatory Visit (HOSPITAL_BASED_OUTPATIENT_CLINIC_OR_DEPARTMENT_OTHER): Payer: Self-pay

## 2020-12-03 MED ORDER — CHLORHEXIDINE GLUCONATE 0.12 % MT SOLN
15.0000 mL | Freq: Two times a day (BID) | OROMUCOSAL | 3 refills | Status: DC
Start: 2020-12-03 — End: 2021-02-17
  Filled 2020-12-03: qty 473, 16d supply, fill #0

## 2020-12-03 MED ORDER — CLINDAMYCIN HCL 300 MG PO CAPS
300.0000 mg | ORAL_CAPSULE | Freq: Two times a day (BID) | ORAL | 0 refills | Status: DC
  Filled 2020-12-03: qty 14, 7d supply, fill #0

## 2020-12-12 ENCOUNTER — Other Ambulatory Visit (HOSPITAL_BASED_OUTPATIENT_CLINIC_OR_DEPARTMENT_OTHER): Payer: Self-pay

## 2020-12-12 ENCOUNTER — Other Ambulatory Visit (HOSPITAL_COMMUNITY): Payer: Self-pay

## 2020-12-12 MED ORDER — AMOXICILLIN-POT CLAVULANATE 500-125 MG PO TABS
1.0000 | ORAL_TABLET | Freq: Two times a day (BID) | ORAL | 0 refills | Status: DC
  Filled 2020-12-12 (×2): qty 10, 5d supply, fill #0

## 2020-12-15 ENCOUNTER — Other Ambulatory Visit (HOSPITAL_BASED_OUTPATIENT_CLINIC_OR_DEPARTMENT_OTHER): Payer: Self-pay

## 2020-12-15 MED ORDER — PFIZER COVID-19 VAC BIVALENT 30 MCG/0.3ML IM SUSP
INTRAMUSCULAR | 0 refills | Status: DC
  Filled 2020-12-15: qty 0.3, 1d supply, fill #0

## 2020-12-22 ENCOUNTER — Other Ambulatory Visit (HOSPITAL_BASED_OUTPATIENT_CLINIC_OR_DEPARTMENT_OTHER): Payer: Self-pay

## 2020-12-22 ENCOUNTER — Ambulatory Visit (HOSPITAL_BASED_OUTPATIENT_CLINIC_OR_DEPARTMENT_OTHER)
Admission: RE | Admit: 2020-12-22 | Discharge: 2020-12-22 | Disposition: A | Discharge status: Home / Self Care | Payer: Medicare Other | Source: Ambulatory Visit | Attending: Internal Medicine | Admitting: Internal Medicine

## 2020-12-22 ENCOUNTER — Other Ambulatory Visit: Payer: Self-pay

## 2020-12-22 DIAGNOSIS — Z1231 Encounter for screening mammogram for malignant neoplasm of breast: Secondary | ICD-10-CM | POA: Diagnosis not present

## 2020-12-22 MED ORDER — AMOXICILLIN-POT CLAVULANATE 875-125 MG PO TABS
ORAL_TABLET | ORAL | 0 refills | Status: DC
  Filled 2020-12-22: qty 10, 5d supply, fill #0

## 2020-12-22 MED ORDER — CELECOXIB 100 MG PO CAPS
ORAL_CAPSULE | ORAL | 0 refills | Status: DC
  Filled 2020-12-22: qty 11, 6d supply, fill #0

## 2020-12-25 ENCOUNTER — Other Ambulatory Visit (HOSPITAL_BASED_OUTPATIENT_CLINIC_OR_DEPARTMENT_OTHER): Payer: Self-pay

## 2020-12-26 ENCOUNTER — Encounter: Payer: Self-pay | Admitting: Internal Medicine

## 2020-12-26 ENCOUNTER — Telehealth (INDEPENDENT_AMBULATORY_CARE_PROVIDER_SITE_OTHER): Payer: Medicare Other | Admitting: Internal Medicine

## 2020-12-26 VITALS — BP 126/72 | HR 74 | Ht 66.0 in | Wt 153.0 lb

## 2020-12-26 DIAGNOSIS — T7840XA Allergy, unspecified, initial encounter: Secondary | ICD-10-CM

## 2020-12-26 DIAGNOSIS — J069 Acute upper respiratory infection, unspecified: Secondary | ICD-10-CM

## 2020-12-26 DIAGNOSIS — R21 Rash and other nonspecific skin eruption: Secondary | ICD-10-CM
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
## 2020-12-26 NOTE — Assessment & Plan Note (Signed)
URI: Symptoms a started 3 days ago, mild sore throat, nasal congestion.  She is already taking an antibiotic due to a dental infection. Recommend to continue Augmentin and Celebrex as prescribed by her dentist. Flonase Tylenol is okay but do not add ibuprofen or aspirin since she is already taking Celebrex. Call if not gradually better Dermatology referral: Reports distal lower extremity itching when she use pantyhose socks, recommend avoidance, patient strongly declined and asked for a dermatology referral.  Will arrange.

## 2020-12-29 ENCOUNTER — Other Ambulatory Visit (HOSPITAL_BASED_OUTPATIENT_CLINIC_OR_DEPARTMENT_OTHER): Payer: Self-pay

## 2021-01-13 ENCOUNTER — Other Ambulatory Visit (HOSPITAL_BASED_OUTPATIENT_CLINIC_OR_DEPARTMENT_OTHER): Payer: Self-pay

## 2021-01-13 ENCOUNTER — Emergency Department (HOSPITAL_BASED_OUTPATIENT_CLINIC_OR_DEPARTMENT_OTHER)
Admission: EM | Admit: 2021-01-13 | Discharge: 2021-01-13 | Disposition: A | Discharge status: Home / Self Care | Payer: Medicare Other | Attending: Emergency Medicine | Admitting: Emergency Medicine

## 2021-01-13 ENCOUNTER — Ambulatory Visit: Payer: Medicare Other | Admitting: Internal Medicine

## 2021-01-13 ENCOUNTER — Encounter (HOSPITAL_BASED_OUTPATIENT_CLINIC_OR_DEPARTMENT_OTHER): Payer: Self-pay

## 2021-01-13 ENCOUNTER — Other Ambulatory Visit: Payer: Self-pay

## 2021-01-13 DIAGNOSIS — E119 Type 2 diabetes mellitus without complications: Secondary | ICD-10-CM | POA: Diagnosis not present

## 2021-01-13 DIAGNOSIS — R220 Localized swelling, mass and lump, head: Secondary | ICD-10-CM | POA: Diagnosis not present

## 2021-01-13 DIAGNOSIS — I10 Essential (primary) hypertension: Secondary | ICD-10-CM | POA: Insufficient documentation

## 2021-01-13 DIAGNOSIS — H5789 Other specified disorders of eye and adnexa: Secondary | ICD-10-CM | POA: Diagnosis not present

## 2021-01-13 DIAGNOSIS — R21 Rash and other nonspecific skin eruption: Secondary | ICD-10-CM | POA: Insufficient documentation

## 2021-01-13 DIAGNOSIS — J45909 Unspecified asthma, uncomplicated: Secondary | ICD-10-CM | POA: Diagnosis not present

## 2021-01-13 MED ORDER — PREDNISONE 10 MG PO TABS
20.0000 mg | ORAL_TABLET | Freq: Every day | ORAL | 0 refills | Status: AC
  Filled 2021-01-13: qty 8, 4d supply, fill #0

## 2021-01-13 MED ORDER — MUPIROCIN 2 % EX OINT
1.0000 "application " | TOPICAL_OINTMENT | Freq: Three times a day (TID) | CUTANEOUS | 0 refills | Status: AC
  Filled 2021-01-13: qty 22, 8d supply, fill #0

## 2021-01-13 NOTE — ED Notes (Signed)
Patient given discharge instructions and reviewed.  Prescriptions given to patient.  Pt denies any questions at this time.  Patient discharged in care of family.

## 2021-01-13 NOTE — ED Notes (Signed)
ED Provider at bedside. 

## 2021-01-13 NOTE — Discharge Instructions (Signed)
You are seen in the emergency room today with puffiness around the eyes.  I am starting you on a topical antibiotic to apply over your eyebrows and we will start you on steroid medications.  If you develop any new or suddenly worsening symptoms please return to the emergency department.

## 2021-01-13 NOTE — ED Triage Notes (Signed)
Pt arrives with swelling to both eyes states she thinks she is having an allergic reaction. States the only difference in her routine was henna placed on her eye brows, and a lemonade drink. States that she took a benadryl last night and allegra this morning. Pt also reports pressure in her head.

## 2021-01-13 NOTE — ED Provider Notes (Signed)
Emergency Department Provider Note   I have reviewed the triage vital signs and the nursing notes.   HISTORY  Chief Complaint Allergic Reaction   HPI Abigail [name] is a 70 y.o. female with past medical history reviewed below presents emergency department with face and eye swelling.  Patient states that she had some eyebrow shadowing done with an type tattoo ink in the past several days.  She developed some swelling to the face and puffiness around the eyes which is developed over the past several days.  She is not feeling tightness in the throat, tongue swelling, shortness of breath.  No rash.  No drainage from the area of ink application.  No abdominal cramping, vomiting, diarrhea.  No radiation of symptoms or other modifying factors.  She has tried Benadryl with no lasting relief.  Past Medical History:  Diagnosis Date   Asthma    Chiari malformation type I (HCC)    Diabetes mellitus    GERD (gastroesophageal reflux disease)    Hx of gout    Hyperlipidemia    Hyperparathyroidism (HCC)    Hypertension    Lumbar radiculopathy    Postablative hypothyroidism    Sickle cell anemia (HCC)    Spondylolysis    Thyroid disease     Patient Active Problem List   Diagnosis Date Noted   Palpitations 06/11/2020   Fatigue 06/11/2020   Cervical myofascial pain syndrome 03/07/2019   Reactive depression 08/09/2017   Somatic complaints, multiple 08/09/2017   Annual physical exam 08/27/2015   Controlled type 2 diabetes mellitus without complication, without Akirah Storck-term current use of insulin (HCC) 05/06/2015   H/o Primary hyperparathyroidism (HCC) 05/06/2015   PCP NOTES >>>>> 01/23/2015   Chiari malformation type I (HCC)    Sickle cell anemia (HCC)    Lumbar radiculopathy    Spondylolysis    Hx of gout    Thyroid disorder 01/02/2015   Allergic rhinitis 01/02/2015    Past Surgical History:  Procedure Laterality Date   ABDOMINAL HYSTERECTOMY  1994   NO oophorectomy    BREAST BIOPSY Left 2015   [facility]    CATARACT EXTRACTION Right 08/2019   CERVICAL LAMINECTOMY     at C1 w/ duraplasty   CRANIECTOMY SUBOCCIPITAL W/ CERVICAL LAMINECTOMY / CHIARI  07/05/2011   At C1, performed at [facility]   PARATH[facility]IDECTOMY  2015   [facility]     Allergies Pollen extract, Citrus, Ibuprofen, and Other  Family History  Problem Relation Age of Onset   Hypertension Sister        sister, daughter , mother    Hyperlipidemia Sister    Thyroid disease Sister    Diabetes Daughter    Stroke Mother    Colon cancer Neg Hx    Sudden death Neg Hx    Heart attack Neg Hx    Breast cancer Neg Hx    Stomach cancer Neg Hx     Social History Social History   Tobacco Use   Smoking status: Never   Smokeless tobacco: Never  Vaping Use   Vaping Use: Never used  Substance Use Topics   Alcohol use: No   Drug use: No    Review of Systems  Constitutional: No fever/chills Eyes: No visual changes. ENT: No sore throat. Cardiovascular: Denies chest pain. Respiratory: Denies shortness of breath. Gastrointestinal: No abdominal pain.  No nausea, no vomiting.  No diarrhea.  No constipation. Genitourinary: Negative for dysuria. Musculoskeletal: Negative for back pain. Skin: Puffiness to the face and  around the eyes.  Neurological: Negative for headaches, focal weakness or numbness.  10-point ROS otherwise negative.  ____________________________________________   PHYSICAL EXAM:  VITAL SIGNS: ED Triage Vitals  Enc Vitals Group     BP 01/13/21 0952 (!) 135/54     Pulse Rate 01/13/21 0952 67     Resp --      Temp 01/13/21 0952 98.4 F (36.9 C)     Temp Source 01/13/21 0952 Oral     SpO2 01/13/21 0952 100 %     Weight 01/13/21 0949 153 lb (69.4 kg)     Height 01/13/21 0949 5\' 6"  (1.676 m)   Constitutional: Alert and oriented. Well appearing and in no acute distress. Eyes: Conjunctivae are normal. Positive bilateral periorbital edema (mild) without cellulitis.  Head:  Atraumatic. Nose: No congestion/rhinnorhea. Mouth/Throat: Mucous membranes are moist.  Neck: No stridor.   Cardiovascular: Normal rate, regular rhythm. Good peripheral circulation. Grossly normal heart sounds.   Respiratory: Normal respiratory effort.  No retractions. Lungs CTAB. Gastrointestinal: Soft and nontender. No distention.  Musculoskeletal: No lower extremity tenderness nor edema. No gross deformities of extremities. Neurologic:  Normal speech and language. No gross focal neurologic deficits are appreciated.  Skin:  Skin is warm, dry and intact. No rash noted.  ____________________________________________   PROCEDURES  Procedure(s) performed:   Procedures  None  ____________________________________________   INITIAL IMPRESSION / ASSESSMENT AND PLAN / ED COURSE  Pertinent labs & imaging results that were available during my care of the patient were reviewed by me and considered in my medical decision making (see chart for details).   Patient presents emergency department with periorbital swelling after henna type ink was applied to the eyebrows.  The underlying skin does not appear obviously infected but there is some mild inflammation in in the eyebrow area bilaterally.  Plan for topical antibiotics in case is developing some mild infection but nothing drainable in terms of abscess.  This may be triggering a mild inflammatory response with some periorbital swelling.  Patient has responded well to steroid in the past and tolerates it well.  She will continue to monitor her blood sugars as well as her blood pressure.  Plan for short steroid burst along with topical antibiotics.  She can continue Benadryl as needed for itching and follow closely with her PCP.  No symptoms to suggest anaphylaxis.  No periorbital or concern for orbital cellulitis   ____________________________________________  FINAL CLINICAL IMPRESSION(S) / ED DIAGNOSES  Final diagnoses:  Rash  Eye swelling,  bilateral     NEW OUTPATIENT MEDICATIONS STARTED DURING THIS VISIT:  Discharge Medication List as of 01/13/2021 10:02 AM     START taking these medications   Details  mupirocin ointment (BACTROBAN) 2 % Apply 1 application topically 3 (three) times daily for 7 days., Starting Tue 01/13/2021, Until Tue 01/20/2021, Normal    predniSONE (DELTASONE) 10 MG tablet Take 2 tablets (20 mg total) by mouth daily for 4 days., Starting Tue 01/13/2021, Until Sat 01/17/2021, Normal        Note:  This document was prepared using Dragon voice recognition software and may include unintentional dictation errors.  01/19/2021, MD, [facility]    Aylen Stradford, [facility], MD 01/14/21 [phone]

## 2021-01-14 ENCOUNTER — Other Ambulatory Visit (HOSPITAL_BASED_OUTPATIENT_CLINIC_OR_DEPARTMENT_OTHER): Payer: Self-pay

## 2021-01-14 MED ORDER — DEXAMETHASONE 4 MG PO TABS
4.0000 mg | ORAL_TABLET | Freq: Three times a day (TID) | ORAL | 0 refills | Status: DC
  Filled 2021-01-14: qty 3, 1d supply, fill #0

## 2021-01-14 MED ORDER — AMOXICILLIN 500 MG PO CAPS
500.0000 mg | ORAL_CAPSULE | Freq: Three times a day (TID) | ORAL | 0 refills | Status: DC
  Filled 2021-01-14: qty 15, 5d supply, fill #0

## 2021-01-14 MED ORDER — TRAMADOL HCL 50 MG PO TABS
50.0000 mg | ORAL_TABLET | Freq: Four times a day (QID) | ORAL | 0 refills | Status: DC | PRN
  Filled 2021-01-14: qty 10, 3d supply, fill #0

## 2021-01-29 ENCOUNTER — Ambulatory Visit: Payer: Medicare Other | Admitting: Internal Medicine

## 2021-02-17 ENCOUNTER — Encounter: Payer: Self-pay | Admitting: Internal Medicine

## 2021-02-17 ENCOUNTER — Telehealth (INDEPENDENT_AMBULATORY_CARE_PROVIDER_SITE_OTHER): Payer: Medicare Other | Admitting: Internal Medicine

## 2021-02-17 VITALS — BP 114/63 | HR 84 | Ht 66.0 in | Wt 153.0 lb

## 2021-02-17 DIAGNOSIS — J069 Acute upper respiratory infection, unspecified: Secondary | ICD-10-CM | POA: Diagnosis not present

## 2021-02-17 NOTE — Assessment & Plan Note (Addendum)
URI: Symptoms a started 5 days ago as described above, taking a variety of OTCs including Tylenol and a "cough syrup". She is fully vaccinated against COVID, negative COVID test 4 days ago. Plan: Rest, fluids, Mucinex DM, Flonase. Retest for COVID today, call if + for possible treatment. Call if not gradually better with conservative treatment. She verbalized understanding
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
## 2021-03-03 LAB — HEMOGLOBIN A1C: Hemoglobin A1C: 8

## 2021-03-03 LAB — HM DIABETES FOOT EXAM: HM Diabetic Foot Exam: NEGATIVE

## 2021-03-16 ENCOUNTER — Other Ambulatory Visit (HOSPITAL_BASED_OUTPATIENT_CLINIC_OR_DEPARTMENT_OTHER): Payer: Self-pay

## 2021-03-20 ENCOUNTER — Other Ambulatory Visit (HOSPITAL_BASED_OUTPATIENT_CLINIC_OR_DEPARTMENT_OTHER): Payer: Self-pay

## 2021-04-03 ENCOUNTER — Other Ambulatory Visit (HOSPITAL_BASED_OUTPATIENT_CLINIC_OR_DEPARTMENT_OTHER): Payer: Self-pay

## 2021-04-06 ENCOUNTER — Encounter: Payer: Self-pay | Admitting: Internal Medicine

## 2021-04-09 ENCOUNTER — Other Ambulatory Visit (HOSPITAL_BASED_OUTPATIENT_CLINIC_OR_DEPARTMENT_OTHER): Payer: Self-pay

## 2021-04-10 ENCOUNTER — Other Ambulatory Visit (HOSPITAL_BASED_OUTPATIENT_CLINIC_OR_DEPARTMENT_OTHER): Payer: Self-pay

## 2021-04-10 ENCOUNTER — Encounter: Payer: Self-pay | Admitting: Internal Medicine

## 2021-04-13 DIAGNOSIS — Z9189 Other specified personal risk factors, not elsewhere classified: Secondary | ICD-10-CM

## 2021-04-13 NOTE — Progress Notes (Signed)
[facility] Customer service manager [facility])  ?                                          [facility] Quality Pharmacy Team  ?                                      Statin Quality Measure Assessment ?  ? ?04/13/2021 ? ?Abigail [name] ?02-20-1950 ?[id number] ? ?Per review of chart and payor information, this patient has been flagged for non-adherence to the following CMS Quality Measure:  ? []  Statin Use in Persons with Diabetes ? []  Statin Use in Persons with Cardiovascular Disease ? ?The 10-year ASCVD risk score (Arnett DK, et al., 2019) is: 11.8% ?  Values used to calculate the score: ?    Age: 26 years ?    Sex: Female ?    Is Non-Hispanic African American: No ?    Diabetic: Yes ?    Tobacco smoker: No ?    Systolic Blood Pressure: 114 mmHg ?    Is BP treated: No ?    HDL Cholesterol: 80 mg/dL ?    Total Cholesterol: 201 mg/dL ?    LDL 105 mg/dL (as of 2020) ? ?Patient was previously on atorvastatin but was stopped due to improved lipid panel. If deemed clinically appropriate, please consider statin initiation or associating an exclusion code. Next appointment is 04/14/2021. ? ? ?Please consider ONE of the following recommendations:  ? ?Initiate high intensity statin Atorvastatin 40mg  once daily, #90, 3 refills  ? Rosuvastatin 20mg  once daily, #90, 3 refills  ?  ?Initiate moderate intensity  ?        statin with reduced frequency if prior  ?        statin intolerance 1x weekly, #13, 3 refills  ? 2x weekly, #26, 3 refills  ? 3x weekly, #39, 3 refills  ? ?Code for past statin intolerance or other exclusions (required annually) ? Drug Induced Myopathy G72.0  ? Myositis, unspecified M60.9  ? Rhabdomyolysis M62.82  ? Cirrhosis of liver K74.69  ? Biliary cirrhosis, unspecified K74.5  ? Abnormal blood glucose - for SUPD ONLY R73.09  ? Prediabetes - for SUPD ONLY  R73.03  ? Polycystic ovarian syndrome E28.2  ? Adverse effect of antihyperlipidemic and antiarteriosclerotic drugs, initial encounter  T46.6X5A  ? ?Thank you for your time, ? ?[id number], PharmD ?Clinical Pharmacist ?[facility] ?Cell: [phone]   ?

## 2021-04-14 ENCOUNTER — Encounter: Payer: Self-pay | Admitting: Internal Medicine

## 2021-04-14 ENCOUNTER — Other Ambulatory Visit (HOSPITAL_BASED_OUTPATIENT_CLINIC_OR_DEPARTMENT_OTHER): Payer: Self-pay

## 2021-04-14 ENCOUNTER — Other Ambulatory Visit: Payer: Self-pay

## 2021-04-14 ENCOUNTER — Ambulatory Visit (INDEPENDENT_AMBULATORY_CARE_PROVIDER_SITE_OTHER): Payer: Medicare Other | Admitting: Internal Medicine

## 2021-04-14 VITALS — BP 120/62 | HR 82 | Ht 66.0 in | Wt 160.0 lb

## 2021-04-14 DIAGNOSIS — E119 Type 2 diabetes mellitus without complications: Secondary | ICD-10-CM

## 2021-04-14 DIAGNOSIS — R748 Abnormal levels of other serum enzymes: Secondary | ICD-10-CM

## 2021-04-14 DIAGNOSIS — E210 Primary hyperparathyroidism: Secondary | ICD-10-CM

## 2021-04-14 DIAGNOSIS — E559 Vitamin D deficiency, unspecified: Secondary | ICD-10-CM

## 2021-04-14 DIAGNOSIS — R7989 Other specified abnormal findings of blood chemistry: Secondary | ICD-10-CM

## 2021-04-14 LAB — T3, FREE: T3, Free: 3.4 pg/mL (ref 2.3–4.2)

## 2021-04-14 LAB — T4, FREE: Free T4: 0.59 ng/dL — ABNORMAL LOW (ref 0.60–1.60)

## 2021-04-14 LAB — CALCIUM: Calcium: 9.2 mg/dL (ref 8.4–10.5)

## 2021-04-14 LAB — VITAMIN D 25 HYDROXY (VIT D DEFICIENCY, FRACTURES): VITD: 42.17 ng/mL (ref 30.00–100.00)

## 2021-04-14 LAB — TSH: TSH: 5.28 u[IU]/mL (ref 0.35–5.50)

## 2021-04-14 LAB — VITAMIN B12: Vitamin B-12: 507 pg/mL (ref 211–911)

## 2021-04-14 MED ORDER — PRAVASTATIN SODIUM 20 MG PO TABS
20.0000 mg | ORAL_TABLET | Freq: Every day | ORAL | 3 refills | Status: DC
  Filled 2021-04-14: qty 90, 90d supply, fill #0

## 2021-04-14 MED ORDER — RYBELSUS 3 MG PO TABS
3.0000 mg | ORAL_TABLET | Freq: Every day | ORAL | 2 refills | Status: DC
  Filled 2021-04-14: qty 30, 30d supply, fill #0

## 2021-04-14 NOTE — Patient Instructions (Addendum)
Please continue: ?-  Metformin ER 500 mg 3x a day ? ?  Please start: ?  - Rybelsus 3 mg before breakfast ?  - Pravastatin 20 mg at night ? ?Please let me know if you tolerate Rybelsus well to call in the higher dose. ? ?Also, continue: ?- vitamin D 5000 units daily ? ?Please return in 3-4 months with your sugar log.  ?
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
## 2021-05-07 ENCOUNTER — Other Ambulatory Visit (HOSPITAL_BASED_OUTPATIENT_CLINIC_OR_DEPARTMENT_OTHER): Payer: Self-pay

## 2021-05-20 ENCOUNTER — Ambulatory Visit (INDEPENDENT_AMBULATORY_CARE_PROVIDER_SITE_OTHER): Payer: Medicare Other | Admitting: Internal Medicine

## 2021-05-20 ENCOUNTER — Other Ambulatory Visit (HOSPITAL_BASED_OUTPATIENT_CLINIC_OR_DEPARTMENT_OTHER): Payer: Self-pay

## 2021-05-20 ENCOUNTER — Encounter: Payer: Self-pay | Admitting: Internal Medicine

## 2021-05-20 VITALS — BP 122/76 | HR 64 | Temp 98.3°F | Resp 16 | Ht 66.0 in | Wt 157.1 lb

## 2021-05-20 DIAGNOSIS — R197 Diarrhea, unspecified: Secondary | ICD-10-CM

## 2021-05-20 MED ORDER — CELECOXIB 100 MG PO CAPS
100.0000 mg | ORAL_CAPSULE | Freq: Two times a day (BID) | ORAL | 0 refills | Status: DC | PRN
  Filled 2021-05-20: qty 30, 15d supply, fill #0

## 2021-05-20 NOTE — Patient Instructions (Addendum)
Per our records you are due for your diabetic eye exam. Please contact your eye doctor to schedule an appointment. Please have them send copies of your office visit notes to [place]. Our fax number is [phone]. If you need a referral to an eye doctor please let us know. ? ? ?Rest ? ?Drink plenty of fluids: Water, Pedialyte, ginger ale, chicken soup, etc. ? ?Okay to take Imodium over-the-counter as needed ? ?Call if not gradually better. ? ?Call if you have fever, chills, blood in the stools ?
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
## 2021-05-21 NOTE — Assessment & Plan Note (Signed)
Diarrhea: ?Acute episode, no red flag symptoms.  Clinical exam is benign. ?Plan: Drink plenty of fluids, Imodium over-the-counter, watch for red flags. ?Also recommend not to visit her husband who lives in a nursing home in case this is a virus. ?She verbalized understanding. ?Cervical myofascial pain: ?In the past she took Celebrex, it caused mild GI side effects, request a prescription.  RF was sent, encourage GI precautions. ?

## 2021-05-27 ENCOUNTER — Other Ambulatory Visit: Payer: Self-pay

## 2021-06-05 ENCOUNTER — Other Ambulatory Visit (HOSPITAL_BASED_OUTPATIENT_CLINIC_OR_DEPARTMENT_OTHER): Payer: Self-pay

## 2021-06-10 ENCOUNTER — Other Ambulatory Visit (HOSPITAL_BASED_OUTPATIENT_CLINIC_OR_DEPARTMENT_OTHER): Payer: Self-pay

## 2021-06-17 ENCOUNTER — Other Ambulatory Visit (HOSPITAL_BASED_OUTPATIENT_CLINIC_OR_DEPARTMENT_OTHER): Payer: Self-pay

## 2021-06-25 ENCOUNTER — Telehealth: Payer: Self-pay | Admitting: Internal Medicine

## 2021-06-25 ENCOUNTER — Other Ambulatory Visit (HOSPITAL_BASED_OUTPATIENT_CLINIC_OR_DEPARTMENT_OTHER): Payer: Self-pay

## 2021-06-25 NOTE — Telephone Encounter (Signed)
Pt called for update. Informed pt awaiting pcp response.

## 2021-06-25 NOTE — Telephone Encounter (Signed)
Pt will need to be seen- okay to place her in the 3:20pm slot tomorrow, 6/2- please make her aware he will only be able to discuss her congestion, sore throat sx's, if she has any other concerns she will need to be scheduled for another visit at another time.

## 2021-06-25 NOTE — Telephone Encounter (Signed)
Pt states she is feeling very sick. She has congestion, sore throat, and mild fever. She has not taken a recent covid test. She declined an appt with another provider as she only wants to see pcp. She stated if she cannot see him she would like to know if he is able to prescribe something as otc medication is not helping. Please advise pt, she would like to know as soon as possible as she is going on a plane on Tuesday.   [facility]   [address], [address], [place] [address]  Phone:  [phone]  Fax:  [phone]

## 2021-06-26 ENCOUNTER — Ambulatory Visit (INDEPENDENT_AMBULATORY_CARE_PROVIDER_SITE_OTHER): Payer: Medicare Other | Admitting: Internal Medicine

## 2021-06-26 ENCOUNTER — Encounter: Payer: Self-pay | Admitting: Internal Medicine

## 2021-06-26 VITALS — BP 136/88 | HR 88 | Temp 98.7°F | Resp 16 | Ht 66.0 in | Wt 157.4 lb

## 2021-06-26 DIAGNOSIS — B349 Viral infection, unspecified: Secondary | ICD-10-CM | POA: Diagnosis not present

## 2021-06-26 LAB — POCT RAPID STREP A (OFFICE): Rapid Strep A Screen: NEGATIVE

## 2021-06-26 LAB — POC COVID19 BINAXNOW: SARS Coronavirus 2 Ag: NEGATIVE

## 2021-06-26 LAB — POCT INFLUENZA A/B
Influenza A, POC: NEGATIVE
Influenza B, POC: NEGATIVE

## 2021-06-26 NOTE — Patient Instructions (Addendum)
Per our records you are due for your diabetic eye exam. Please contact your eye doctor to schedule an appointment. Please have them send copies of your office visit notes to [place]. Our fax number is [phone]. If you need a referral to an eye doctor please let us know.   Tests came back negative  Recheck for COVID tomorrow or the next day, call the office if it came back positive.  Rest, fluids , tylenol  For cough:  Take Mucinex DM or Robitussin-DM OTC.  Follow the instructions in the box.   For nasal congestion: -Use over-the-counter Flonase: 2 nasal sprays on each side of the nose in the morning until you feel better  -Use OTC Astepro 2 nasal sprays on each side of the nose twice daily until better  Avoid decongestants such as  Pseudoephedrine or phenylephrine     Call if not gradually better over the next few days  Seek medical attention if symptoms are severe, you have high fever, short of breath, chest pain
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
## 2021-06-27 NOTE — Assessment & Plan Note (Signed)
Viral syndrome: Symptoms started yesterday, nontoxic-appearing. COVID, flu, strep test: Negative Plan: Recommend to recheck, COVID tomorrow or the next day Conservative treatment with fluids, Astelin, Flonase, Tylenol, Mucinex. Reach for help if severe symptoms or not improving in few days

## 2021-07-07 ENCOUNTER — Encounter (INDEPENDENT_AMBULATORY_CARE_PROVIDER_SITE_OTHER): Payer: Medicare Other | Admitting: Ophthalmology

## 2021-07-17 ENCOUNTER — Encounter: Payer: Self-pay | Admitting: Family Medicine

## 2021-07-17 ENCOUNTER — Ambulatory Visit (INDEPENDENT_AMBULATORY_CARE_PROVIDER_SITE_OTHER): Payer: Medicare Other | Admitting: Family Medicine

## 2021-07-17 ENCOUNTER — Other Ambulatory Visit (HOSPITAL_BASED_OUTPATIENT_CLINIC_OR_DEPARTMENT_OTHER): Payer: Self-pay

## 2021-07-17 VITALS — BP 122/68 | HR 86 | Temp 97.6°F | Ht 66.0 in | Wt 158.6 lb

## 2021-07-17 DIAGNOSIS — T7840XA Allergy, unspecified, initial encounter: Secondary | ICD-10-CM | POA: Insufficient documentation

## 2021-07-17 MED ORDER — METHYLPREDNISOLONE SODIUM SUCC 125 MG IJ SOLR
125.0000 mg | Freq: Once | INTRAMUSCULAR | Status: AC
  Administered 2021-07-17: 125 mg via INTRAMUSCULAR

## 2021-07-17 MED ORDER — PREDNISONE 10 MG PO TABS
ORAL_TABLET | ORAL | 0 refills | Status: DC
  Filled 2021-07-17: qty 21, 6d supply, fill #0

## 2021-07-17 NOTE — Progress Notes (Signed)
Established Patient Office Visit  Subjective   Patient ID: Abigail [name], female    DOB: 1950-08-14  Age: 71 y.o. MRN: [id number]  Chief Complaint  Patient presents with   Allergic Reaction    Allergic reaction to to hair products. Swelling in face, very itchy scalp, soreness in face, ears neck and face symptoms x 6 days.     Allergic Reaction Pertinent negatives include no abdominal pain, chest pain or wheezing.   for evaluation and treatment of a 1 week history of a burning pruritic scaling rash in her scalp with associated swelling in her upper eyelids and ears status post using a hair dye just prior to the onset of symptoms.  When she read the ingredients she realized that she had had an allergic reaction to some of the contained ingredients in the past.  Denies swelling in the throat or difficulty breathing.  She has tried Careers adviser and over-the-counter Benadryl without much relief.  History of diabetes.  Last hemoglobin A1c 8    Review of Systems  Constitutional:  Negative for chills, diaphoresis, malaise/fatigue and weight loss.  HENT: Negative.  Negative for sore throat.   Eyes: Negative.  Negative for blurred vision and double vision.  Respiratory:  Negative for shortness of breath and wheezing.   Cardiovascular:  Negative for chest pain.  Gastrointestinal:  Negative for abdominal pain and nausea.  Genitourinary: Negative.   Musculoskeletal:  Negative for falls and myalgias.  Neurological:  Negative for speech change, loss of consciousness and weakness.  Psychiatric/Behavioral: Negative.        Objective:     BP 122/68 (BP Location: Right Arm, Patient Position: Sitting, Cuff Size: Normal)   Pulse 86   Temp 97.6 F (36.4 C) (Temporal)   Ht 5\' 6"  (1.676 m)   Wt 158 lb 9.6 oz (71.9 kg)   SpO2 99%   BMI 25.60 kg/m    Physical Exam Constitutional:      General: She is not in acute distress.    Appearance: Normal appearance. She is not ill-appearing,  toxic-appearing or diaphoretic.  HENT:     Head: Normocephalic and atraumatic.     Right Ear: External ear normal.     Left Ear: External ear normal.     Mouth/Throat:     Mouth: Mucous membranes are moist.     Pharynx: Oropharynx is clear. No oropharyngeal exudate or posterior oropharyngeal erythema.  Eyes:     General: No scleral icterus.       Right eye: No discharge.        Left eye: No discharge.     Extraocular Movements: Extraocular movements intact.     Conjunctiva/sclera: Conjunctivae normal.     Pupils: Pupils are equal, round, and reactive to light.  Cardiovascular:     Rate and Rhythm: Normal rate and regular rhythm.  Pulmonary:     Effort: Pulmonary effort is normal. No respiratory distress.     Breath sounds: Normal breath sounds. No wheezing or rales.  Abdominal:     General: Bowel sounds are normal.  Musculoskeletal:     Cervical back: No rigidity or tenderness.  Skin:    General: Skin is warm and dry.       Neurological:     Mental Status: She is alert and oriented to person, place, and time.  Psychiatric:        Mood and Affect: Mood normal.        Behavior: Behavior normal.  No results found for any visits on 07/17/21.    The 10-year ASCVD risk score (Arnett DK, et al., 2019) is: 13.4%    Assessment & Plan:   Problem List Items Addressed This Visit       Other   Allergic reaction - Primary   Relevant Medications   methylPREDNISolone sodium succinate (SOLU-MEDROL) 125 mg/2 mL injection 125 mg (Start on 07/17/2021  3:15 PM)   predniSONE (DELTASONE) 10 MG tablet    No follow-ups on file.  Solu-Medrol 125 today.  We will start 6-day Dosepak tomorrow.  Advised that she be extra careful with carbohydrate intake over the next 6 days.  With primary if not improving next week.  Mliss Sax, MD

## 2021-07-20 ENCOUNTER — Ambulatory Visit (INDEPENDENT_AMBULATORY_CARE_PROVIDER_SITE_OTHER): Payer: Medicare Other | Admitting: Medical

## 2021-07-20 ENCOUNTER — Encounter: Payer: Self-pay | Admitting: Medical

## 2021-07-20 ENCOUNTER — Other Ambulatory Visit (HOSPITAL_BASED_OUTPATIENT_CLINIC_OR_DEPARTMENT_OTHER): Payer: Self-pay

## 2021-07-20 VITALS — BP 135/70 | HR 70 | Temp 98.3°F | Resp 18 | Ht 66.0 in | Wt 158.0 lb

## 2021-07-20 DIAGNOSIS — D649 Anemia, unspecified: Secondary | ICD-10-CM

## 2021-07-20 DIAGNOSIS — E119 Type 2 diabetes mellitus without complications: Secondary | ICD-10-CM | POA: Diagnosis not present

## 2021-07-20 DIAGNOSIS — L659 Nonscarring hair loss, unspecified: Secondary | ICD-10-CM

## 2021-07-20 DIAGNOSIS — L089 Local infection of the skin and subcutaneous tissue, unspecified: Secondary | ICD-10-CM | POA: Diagnosis not present

## 2021-07-20 DIAGNOSIS — M549 Dorsalgia, unspecified: Secondary | ICD-10-CM | POA: Diagnosis not present

## 2021-07-20 DIAGNOSIS — R5383 Other fatigue: Secondary | ICD-10-CM | POA: Diagnosis not present

## 2021-07-20 DIAGNOSIS — M545 Low back pain, unspecified: Secondary | ICD-10-CM | POA: Diagnosis not present

## 2021-07-20 DIAGNOSIS — T7840XD Allergy, unspecified, subsequent encounter: Secondary | ICD-10-CM

## 2021-07-20 MED ORDER — HYDROXYZINE HCL 10 MG PO TABS
ORAL_TABLET | ORAL | 0 refills | Status: DC
  Filled 2021-07-20: qty 20, 10d supply, fill #0

## 2021-07-20 MED ORDER — CEPHALEXIN 500 MG PO CAPS
500.0000 mg | ORAL_CAPSULE | Freq: Two times a day (BID) | ORAL | 0 refills | Status: DC
  Filled 2021-07-20: qty 20, 10d supply, fill #0

## 2021-07-21 LAB — CBC WITH DIFFERENTIAL/PLATELET
Basophils Absolute: 0.1 10*3/uL (ref 0.0–0.1)
Basophils Relative: 1.1 % (ref 0.0–3.0)
Eosinophils Absolute: 0 10*3/uL (ref 0.0–0.7)
Eosinophils Relative: 0.1 % (ref 0.0–5.0)
HCT: 35.8 % — ABNORMAL LOW (ref 36.0–46.0)
Hemoglobin: 11.9 g/dL — ABNORMAL LOW (ref 12.0–15.0)
Lymphocytes Relative: 18.8 % (ref 12.0–46.0)
Lymphs Abs: 1.7 10*3/uL (ref 0.7–4.0)
MCHC: 33.1 g/dL (ref 30.0–36.0)
MCV: 88 fl (ref 78.0–100.0)
Monocytes Absolute: 0.6 10*3/uL (ref 0.1–1.0)
Monocytes Relative: 6.9 % (ref 3.0–12.0)
Neutro Abs: 6.7 10*3/uL (ref 1.4–7.7)
Neutrophils Relative %: 73.1 % (ref 43.0–77.0)
Platelets: 277 10*3/uL (ref 150.0–400.0)
RBC: 4.07 Mil/uL (ref 3.87–5.11)
RDW: 14.6 % (ref 11.5–15.5)
WBC: 9.1 10*3/uL (ref 4.0–10.5)

## 2021-07-21 LAB — COMPREHENSIVE METABOLIC PANEL
ALT: 20 U/L (ref 0–35)
AST: 17 U/L (ref 0–37)
Albumin: 4.5 g/dL (ref 3.5–5.2)
Alkaline Phosphatase: 74 U/L (ref 39–117)
BUN: 27 mg/dL — ABNORMAL HIGH (ref 6–23)
CO2: 28 mEq/L (ref 19–32)
Calcium: 9 mg/dL (ref 8.4–10.5)
Chloride: 100 mEq/L (ref 96–112)
Creatinine, Ser: 1.05 mg/dL (ref 0.40–1.20)
GFR: 54.23 mL/min — ABNORMAL LOW (ref >60.00)
Glucose, Bld: 144 mg/dL — ABNORMAL HIGH (ref 70–99)
Potassium: 4.5 mEq/L (ref 3.5–5.1)
Sodium: 138 mEq/L (ref 135–145)
Total Bilirubin: 0.3 mg/dL (ref 0.2–1.2)
Total Protein: 7.3 g/dL (ref 6.0–8.3)

## 2021-07-21 LAB — T4, FREE: Free T4: 0.75 ng/dL (ref 0.60–1.60)

## 2021-07-21 LAB — URINE CULTURE
MICRO NUMBER:: 13571773
Result:: NO GROWTH
SPECIMEN QUALITY:: ADEQUATE

## 2021-07-21 LAB — TSH: TSH: 2.17 u[IU]/mL (ref 0.35–5.50)

## 2021-07-21 LAB — FOLATE: Folate: 18.6 ng/mL (ref >5.9)

## 2021-07-21 LAB — VITAMIN B12: Vitamin B-12: 595 pg/mL (ref 211–911)

## 2021-07-21 LAB — IRON: Iron: 53 ug/dL (ref 42–145)

## 2021-07-23 LAB — VITAMIN B1: Vitamin B1 (Thiamine): 11 nmol/L (ref 8–30)

## 2021-07-29 ENCOUNTER — Ambulatory Visit: Payer: Medicare Other | Admitting: Internal Medicine

## 2021-07-29 ENCOUNTER — Ambulatory Visit (HOSPITAL_BASED_OUTPATIENT_CLINIC_OR_DEPARTMENT_OTHER)
Admission: RE | Admit: 2021-07-29 | Discharge: 2021-07-29 | Disposition: A | Discharge status: Home / Self Care | Payer: Medicare Other | Source: Ambulatory Visit | Attending: Medical | Admitting: Medical

## 2021-07-29 ENCOUNTER — Encounter: Payer: Self-pay | Admitting: Medical

## 2021-07-29 ENCOUNTER — Ambulatory Visit (INDEPENDENT_AMBULATORY_CARE_PROVIDER_SITE_OTHER): Payer: Medicare Other | Admitting: Medical

## 2021-07-29 VITALS — BP 118/56 | HR 72 | Resp 18 | Ht 66.0 in | Wt 158.4 lb

## 2021-07-29 DIAGNOSIS — M542 Cervicalgia: Secondary | ICD-10-CM

## 2021-07-29 DIAGNOSIS — T7840XD Allergy, unspecified, subsequent encounter: Secondary | ICD-10-CM | POA: Diagnosis not present

## 2021-07-29 DIAGNOSIS — E119 Type 2 diabetes mellitus without complications: Secondary | ICD-10-CM | POA: Diagnosis not present

## 2021-07-29 DIAGNOSIS — L089 Local infection of the skin and subcutaneous tissue, unspecified: Secondary | ICD-10-CM

## 2021-07-29 DIAGNOSIS — M545 Low back pain, unspecified: Secondary | ICD-10-CM | POA: Diagnosis not present

## 2021-07-29 NOTE — Progress Notes (Signed)
Subjective:    Patient ID: Abigail [name], female    DOB: March 01, 1950, 71 y.o.   MRN: [id number]  HPI  Pt states her scalp feels a lot better. She thinks most of swelling has decreased. Her scalp itching did improve with hydroxyzine. She states she did find a shampoo which she bought at [employer]. She thinks it did help after reaction to shampoo she got from [place].  Pt has low back pain.She will take tylenol and ibuprofen for pain( pt had not reaction). She states helps with pain but she state ibuprofen will sedate her.  I did place lumbar spine xray after her last visit.   Neck pain for years. Hx of pain since 2013. Pt has hx of Chiari Malformation.  Pt used to be on lyrica in past. She used it for lower extremity pain.  Diabetic- sugars are better now that she is off the prednisone.    Review of Systems  Constitutional:  Negative for chills, fatigue and fever.  Respiratory:  Negative for cough, chest tightness, shortness of breath and wheezing.   Gastrointestinal:  Negative for abdominal pain and blood in stool.  Musculoskeletal:  Positive for back pain and neck pain.  Skin:        See hpi.  Neurological:  Negative for dizziness, syncope, weakness, numbness and headaches.  Psychiatric/Behavioral:  Negative for behavioral problems and confusion.     Past Medical History:  Diagnosis Date   Asthma    Chiari malformation type I ([place])    Diabetes mellitus    GERD (gastroesophageal reflux disease)    Hx of gout    Hyperlipidemia    Hyperparathyroidism (HCC)    Hypertension    Lumbar radiculopathy    Postablative hypothyroidism    Sickle cell anemia (HCC)    Spondylolysis    Thyroid disease      Social History   Socioeconomic History   Marital status: Married    Spouse name: Not on file   Number of children: 1   Years of education: Not on file   Highest education level: Not on file  Occupational History   Occupation: retired, still works part  Public house manager , high school  Tobacco Use   Smoking status: Never   Smokeless tobacco: Never  Vaping Use   Vaping Use: Never used  Substance and Sexual Activity   Alcohol use: No   Drug use: No   Sexual activity: Not Currently    Partners: Male    Comment: 1st intercourse- 23, married- 13 yrs   Other Topics Concern   Not on file  Social History Narrative   Original from [place]   Married     1 daughter in [place]   Jehovah witness : no transfusions    Social Determinants of Radio broadcast assistant Strain: Not on file  Food Insecurity: Not on file  Transportation Needs: Not on file  Physical Activity: Not on file  Stress: Not on file  Social Connections: Not on file  Intimate Partner Violence: Not on file    Past Surgical History:  Procedure Laterality Date   ABDOMINAL HYSTERECTOMY  1994   NO oophorectomy   BREAST BIOPSY Left 2015   [facility]    CATARACT EXTRACTION Right 08/2019   CERVICAL LAMINECTOMY     at C1 w/ duraplasty   CRANIECTOMY SUBOCCIPITAL W/ CERVICAL LAMINECTOMY / CHIARI  07/05/2011   At C1, performed at [place]  2015   [facility]  Family History  Problem Relation Age of Onset   Hypertension Sister        sister, daughter , mother    Hyperlipidemia Sister    Thyroid disease Sister    Diabetes Daughter    Stroke Mother    Colon cancer Neg Hx    Sudden death Neg Hx    Heart attack Neg Hx    Breast cancer Neg Hx    Stomach cancer Neg Hx     Allergies  Allergen Reactions   Pollen Extract Itching   Citrus Itching, Rash and Swelling   Ibuprofen Swelling and Rash    Had a reaction to ibuprofen x1 but reports she is able to take aspirin   Other Itching, Rash and Swelling    seafood. seafood    Current Outpatient Medications on File Prior to Visit  Medication Sig Dispense Refill   acetaminophen (TYLENOL) 500 MG tablet Take 1,000 mg by mouth every 6 (six) hours as needed.     albuterol (VENTOLIN HFA) 108 (90 Base) MCG/ACT  inhaler Inhale 2 puffs into the lungs every 6 (six) hours as needed for wheezing or shortness of breath. 18 g 1   Blood Glucose Monitoring Suppl (ONETOUCH VERIO) w/Device KIT Use 1-4 times daily as needed/directed DX E11.9 1 kit 0   celecoxib (CELEBREX) 100 MG capsule Take 1 capsule (100 mg total) by mouth 2 (two) times daily as needed for moderate pain. 30 capsule 0   cephALEXin (KEFLEX) 500 MG capsule Take 1 capsule (500 mg total) by mouth 2 (two) times daily. 20 capsule 0   Cholecalciferol (VITAMIN D3) 1.25 MG (50000 UT) TABS Take by mouth.     Diclofenac Sodium (PENNSAID) 2 % SOLN Apply 2 Pump topically 2 (two) times daily. 112 g 0   fexofenadine (ALLEGRA) 180 MG tablet Take 180 mg by mouth daily.     glucose blood test strip Reported on 07/09/2015     hydrOXYzine (ATARAX) 10 MG tablet Take 1 - 2 tablets by mouth every night at bedtime as needed for itching 20 tablet 0   Lancets (ONETOUCH DELICA PLUS [id number]) MISC USE TO CHECK BLOOD SUGAR 1 TO 4 TIMES A DAY AS NEEDED AS DIRECTED 200 each 12   metFORMIN (GLUCOPHAGE-XR) 500 MG 24 hr tablet TAKE 1 TABLET BY MOUTH THREE TIMES DAILY WITH MEALS 270 tablet 3   ONETOUCH VERIO test strip USE 1-4 TIMES DAILY AS DIRECTED/NEEDED 200 strip 12   OVER THE COUNTER MEDICATION Health joint     Potassium 99 MG TABS Take by mouth.     pravastatin (PRAVACHOL) 20 MG tablet Take 1 tablet (20 mg total) by mouth daily. 90 tablet 3   predniSONE (DELTASONE) 10 MG tablet Take 6 tablets by mouth on day 1, then take 5 tablets on day 2, then take 4 tablets on day 3, then 3 tablets on day 4, then 2 tablets on day 5, then 1 tablet on day 6. Start tomorrow. 21 tablet 0   pregabalin (LYRICA) 100 MG capsule Take 1 capsule (100 mg total) by mouth at bedtime. 90 capsule 3   Semaglutide (RYBELSUS) 3 MG TABS Take 3 mg by mouth daily. 30 tablet 2   No current facility-administered medications on file prior to visit.    BP (!) 118/56   Pulse 72   Resp 18   Ht 5' 6"  (1.676 m)    Wt 158 lb 6.4 oz (71.8 kg)   SpO2 97%   BMI 25.57 kg/m  Objective:   Physical Exam  General Appearance- Not in acute distress.   Chest and Lung Exam Auscultation: Breath sounds:-Normal. Clear even and unlabored. Adventitious sounds:- No Adventitious sounds.  Cardiovascular Auscultation:Rythm - Regular, rate and rythm. Heart Sounds -Normal heart sounds.  Abdomen Inspection:-Inspection Normal.  Palpation/Perucssion: Palpation and Percussion of the abdomen reveal- Non Tender, No Rebound tenderness, No rigidity(Guarding) and No Palpable abdominal masses.  Liver:-Normal.  Spleen:- Normal.   Back Mid lumbar spine tenderness to palpation. Pain on straight leg lift. Pain on lateral movements and flexion/extension of the spine.  Lower ext neurologic  L5-S1 sensation intact bilaterally. Normal patellar reflexes bilaterally. No foot drop bilaterally.   Neck- mild mid cspine and left trapezus tender to palpation.  Skin- scalp appears much better. Less swollen and not tender. No rash.     Assessment & Plan:   Patient Instructions  Low back pain and neck pain. Will get xray of both areas. Recmmend use your celebrex for pain. Add on/restart the lyrica. Dc ibuprofen over the counter.  Allergic reaction to shampoo with secondary skin infection appears mostly resolved. I don't think further treatment needed.  Diabetes recently better controlled sine stopping prednisone. Continue metformin.  Follow up with Dr. Larose Kells October or sooner if needed.    Mackie Pai, PA-C

## 2021-07-29 NOTE — Patient Instructions (Addendum)
Low back pain and neck pain. Will get xray of both areas. Recmmend use your celebrex for pain. Add on/restart the lyrica. Dc ibuprofen over the counter.  Allergic reaction to shampoo with secondary skin infection appears mostly resolved. I don't think further treatment needed.  Diabetes recently better controlled sine stopping prednisone. Continue metformin.  Follow up with Dr. Drue Novel October or sooner if needed.

## 2021-08-03 ENCOUNTER — Other Ambulatory Visit (HOSPITAL_BASED_OUTPATIENT_CLINIC_OR_DEPARTMENT_OTHER): Payer: Self-pay

## 2021-08-03 MED ORDER — AMOXICILLIN 500 MG PO CAPS
ORAL_CAPSULE | ORAL | 0 refills | Status: DC
  Filled 2021-08-03: qty 15, 5d supply, fill #0

## 2021-08-10 ENCOUNTER — Other Ambulatory Visit (HOSPITAL_BASED_OUTPATIENT_CLINIC_OR_DEPARTMENT_OTHER): Payer: Self-pay

## 2021-08-10 MED ORDER — CLINDAMYCIN HCL 300 MG PO CAPS
ORAL_CAPSULE | ORAL | 2 refills | Status: DC
  Filled 2021-08-10: qty 14, 7d supply, fill #0

## 2021-08-12 ENCOUNTER — Encounter: Payer: Self-pay | Admitting: Internal Medicine

## 2021-08-12 ENCOUNTER — Ambulatory Visit (INDEPENDENT_AMBULATORY_CARE_PROVIDER_SITE_OTHER): Payer: Medicare Other | Admitting: Internal Medicine

## 2021-08-12 VITALS — BP 124/62 | HR 78 | Temp 98.4°F | Resp 16 | Ht 66.0 in | Wt 160.5 lb

## 2021-08-12 DIAGNOSIS — H00014 Hordeolum externum left upper eyelid: Secondary | ICD-10-CM | POA: Diagnosis not present

## 2021-08-12 NOTE — Patient Instructions (Signed)
Recommend to proceed with covid booster (bivalent) at your pharmacy. Flu shot this fall.
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
## 2021-08-13 NOTE — Assessment & Plan Note (Signed)
Stye: Developed few days ago, interestingly she started taking antibiotics for a different reason yesterday and today the area seems better. Plan: Continue oral antibiotics, warm or cold compress, see general ophthalmologist if not improving

## 2021-08-17 ENCOUNTER — Encounter (INDEPENDENT_AMBULATORY_CARE_PROVIDER_SITE_OTHER): Payer: Medicare Other | Admitting: Ophthalmology

## 2021-08-17 DIAGNOSIS — E113393 Type 2 diabetes mellitus with moderate nonproliferative diabetic retinopathy without macular edema, bilateral: Secondary | ICD-10-CM | POA: Diagnosis not present

## 2021-08-17 DIAGNOSIS — H43813 Vitreous degeneration, bilateral: Secondary | ICD-10-CM | POA: Diagnosis not present

## 2021-08-17 DIAGNOSIS — H33302 Unspecified retinal break, left eye: Secondary | ICD-10-CM | POA: Diagnosis not present

## 2021-08-17 DIAGNOSIS — H35373 Puckering of macula, bilateral: Secondary | ICD-10-CM | POA: Diagnosis not present

## 2021-08-17 LAB — HM DIABETES EYE EXAM

## 2021-08-25 ENCOUNTER — Ambulatory Visit (INDEPENDENT_AMBULATORY_CARE_PROVIDER_SITE_OTHER): Payer: Medicare Other | Admitting: Internal Medicine

## 2021-08-25 ENCOUNTER — Encounter: Payer: Self-pay | Admitting: Internal Medicine

## 2021-08-25 VITALS — BP 128/76 | HR 76 | Ht 66.0 in | Wt 159.6 lb

## 2021-08-25 DIAGNOSIS — E1165 Type 2 diabetes mellitus with hyperglycemia: Secondary | ICD-10-CM

## 2021-08-25 DIAGNOSIS — Z5320 Procedure and treatment not carried out because of patient's decision for unspecified reasons: Secondary | ICD-10-CM | POA: Diagnosis not present

## 2021-08-25 DIAGNOSIS — R7989 Other specified abnormal findings of blood chemistry: Secondary | ICD-10-CM

## 2021-08-25 DIAGNOSIS — R748 Abnormal levels of other serum enzymes: Secondary | ICD-10-CM

## 2021-08-25 DIAGNOSIS — E210 Primary hyperparathyroidism: Secondary | ICD-10-CM

## 2021-08-25 DIAGNOSIS — E559 Vitamin D deficiency, unspecified: Secondary | ICD-10-CM

## 2021-08-25 LAB — POCT GLYCOSYLATED HEMOGLOBIN (HGB A1C): Hemoglobin A1C: 6.4 % — AB (ref 4.0–5.6)

## 2021-08-25 NOTE — Addendum Note (Signed)
Addended by: Eden Lathe on: 08/25/2021 10:35 AM   Modules accepted: Orders

## 2021-08-25 NOTE — Patient Instructions (Addendum)
Please continue: - Metformin ER 500 mg 3x a day  Also, continue: - vitamin D 5000 units daily  Please return in 6 months with your sugar log.  

## 2021-08-25 NOTE — Progress Notes (Signed)
Patient ID: Abigail [name], female   DOB: 03/04/1950, 71 y.o.   MRN: [id number]  HPI: Abigail [name] is a 71 y.o.-year-old female, returning for f/u for DM2, dx in ~2010, non-insulin-dependent, controlled, without complications and h/o primary HPTH.  She is the wife of [name], also my patient. Last visit 4 months ago.  Interim history: No increased urination, blurry vision. No nausea, chest pain. She started Rybelsus but only took it for 1 week >> diarrhea, low blood sugars >> stopped. She changed her diet since last visit, and sugars improved.  She is trying to eliminate gluten. She was on steroids for skin allergy after dyeing her hair.  DM2: Reviewed HbA1c levels: Lab Results  Component Value Date   HGBA1C 8 03/03/2021   HGBA1C 6.2 (A) 09/23/2020   HGBA1C 6.3 (A) 05/21/2020   Pt is on a regimen of: - Metformin ER 500 mg 2x >> 3x a day  forgot. She could not tolerate Rybelsus >> diarrhea, lows CBGs.  Pt checks her sugars 1-2x a day per review of her log: - am: 117-157 >> 58, 68-129 >> 73-133, 147 >> 81-128 >> 95-100, 115 - 2h after b'fast: 113, 148 >> 101-136 >> 98-104 >> 107 >> n/c - before lunch: 99-119 >> n/c >> 215, 269 >> 121 >> n/c - 2h after lunch: 171, 226 >> 177 >> 107-215 >> 191 >> 149-200 - before dinner: 111, 133 >> 101-135 >> 131, 135 >> 79 >> n/c - 2h after dinner: n/c >> 124-147, 161 >> 182 >> 169 >> <160 - bedtime: n/c >> 132 >> n/c >> 152 >> n/c - nighttime: n/c Lowest sugar was 58 >> 73 >> 79 >> 95 ; she has hypoglycemia awareness in the 80s. Highest sugar was269 (plantains, forgot meds) >> 191 >> 200  Glucometer: ReliOn  Pt's meals are: - Breakfast: Fruit, eggs - Lunch: chicken/fish + veggies - no red meat  - Dinner: veggies + fruit +/- chicken/fish - Snacks: cashews, fruit (Mango)  No CKD, last BUN/creatinine:  Lab Results  Component Value Date   BUN 27 (H) 07/20/2021   CREATININE 1.05 07/20/2021   + HL; last set of  lipids: Lab Results  Component Value Date   CHOL 201 (H) 11/07/2020   HDL 80 11/07/2020   LDLCALC 105 (H) 11/07/2020   TRIG 75 11/07/2020   CHOLHDL 2.5 11/07/2020  She is not on a statin - declined.  She was previously on pravastatin but reportedly this was stopped due to good control. She has a history of fatty liver per ultrasound from 2017. I again recommended Pravastatin at last OV >> refuses.  - last eye exam was on 08/17/2021: No DR-she had cataract surgery.   -+ Numbness and tingling in her feet.  She has a history of surgery on her toe.  Last foot exam was performed 02/2021.  H/o primary hyperparathyroidism  - s/p 2x parathyroid glands resected in 2015, with resolution of her hypercalcemia  She will in the ED with paresthesia and pain in the right arm on 01/08/2019.  At that time, calcium was slightly low, at 8.7 (8.9-10.3).  It normalized afterwards.  I advised her to take Tums x 1 tab with dinner every night.  She is not taking this.  Calcium was normal at last check: Lab Results  Component Value Date   CALCIUM 9.0 07/20/2021   CALCIUM 9.2 04/14/2021   CALCIUM 9.4 06/20/2020   CALCIUM 9.2 06/11/2020   CALCIUM 9.0 10/17/2019  CALCIUM 8.9 01/16/2019   CALCIUM 8.7 (L) 01/08/2019   CALCIUM 10.3 07/25/2018   CALCIUM 9.2 07/20/2017   CALCIUM 9.1 07/21/2016   Vitamin D deficiency:  Reviewed vitamin D levels: Lab Results  Component Value Date   VD25OH 42.17 04/14/2021   VD25OH 40 10/17/2019   VD25OH 31.28 01/16/2019   VD25OH 35.44 02/16/2018   VD25OH 14.17 (L) 08/12/2017  She is on 5000 is vitamin D daily.  Reviewed vitamin B-12 levels: Lab Results  Component Value Date   VITAMINB12 595 07/20/2021   VITAMINB12 507 04/14/2021   VITAMINB12 560 01/16/2020   VITAMINB12 616 07/18/2019   VITAMINB12 >1500 (H) 01/16/2019   VITAMINB12 >1500 (H) 07/20/2017   VITAMINB12 623 07/21/2016   VITAMINB12 394 07/09/2015  In the past, on B12 2000 mcg daily but on this dose,  her level returned high.  We decreased the dose to 1000 mcg daily but she stopped it.  We continued off the supplement since her B12 level is normal.  Previously on gabapentin, now off.  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 Otherwise, unchanged from my initial visit note.    Past Medical History:  Diagnosis Date   Asthma    Chiari malformation type I ([place])    Diabetes mellitus    GERD (gastroesophageal reflux disease)    Hx of gout    Hyperlipidemia    Hyperparathyroidism ([place])    Hypertension    Lumbar radiculopathy    Postablative hypothyroidism    Sickle cell anemia ([place])    Spondylolysis    Thyroid disease    Past Surgical History:  Procedure Laterality Date   ABDOMINAL HYSTERECTOMY  1994   NO oophorectomy   BREAST BIOPSY Left 2015   [facility]    CATARACT EXTRACTION Right 08/2019   CERVICAL LAMINECTOMY     at C1 w/ duraplasty   CRANIECTOMY SUBOCCIPITAL W/ CERVICAL LAMINECTOMY / CHIARI  07/05/2011   At C1, performed at [place]  2015   [facility]    Social History   Social History   Marital Status: Married    Spouse Name: N/A   Number of Children: 1   Occupational History   Pharmacist, hospital - high school     Social History Main Topics   Smoking status: Never Smoker    Smokeless tobacco: Not on file   Alcohol Use: No   Drug Use: No   Social History Narrative   Original from [place]   Married x 44 years   Current Outpatient Medications on File Prior to Visit  Medication Sig Dispense Refill    acetaminophen (TYLENOL) 500 MG tablet Take 1,000 mg by mouth every 6 (six) hours as needed.     albuterol (VENTOLIN HFA) 108 (90 Base) MCG/ACT inhaler Inhale 2 puffs into the lungs every 6 (six) hours as needed for wheezing or shortness of breath. 18 g 1   Blood Glucose Monitoring Suppl (ONETOUCH VERIO) w/Device KIT Use 1-4 times  daily as needed/directed DX E11.9 1 kit 0   celecoxib (CELEBREX) 100 MG capsule Take 1 capsule (100 mg total) by mouth 2 (two) times daily as needed for moderate pain. 30 capsule 0   Cholecalciferol (VITAMIN D3) 1.25 MG (50000 UT) TABS Take by mouth.     clindamycin (CLEOCIN) 300 MG capsule Take 1 capsule by mouth twice a day for 7 days 14 capsule 2   Diclofenac Sodium (PENNSAID) 2 % SOLN Apply 2 Pump topically 2 (two) times daily. 112 g 0   fexofenadine (ALLEGRA) 180 MG tablet Take 180 mg by mouth daily.     glucose blood test strip Reported on 07/09/2015     hydrOXYzine (ATARAX) 10 MG tablet Take 1 - 2 tablets by mouth every night at bedtime as needed for itching 20 tablet 0   Lancets (ONETOUCH DELICA PLUS [id number]) MISC USE TO CHECK BLOOD SUGAR 1 TO 4 TIMES A DAY AS NEEDED AS DIRECTED 200 each 12   metFORMIN (GLUCOPHAGE-XR) 500 MG 24 hr tablet TAKE 1 TABLET BY MOUTH THREE TIMES DAILY WITH MEALS 270 tablet 3   ONETOUCH VERIO test strip USE 1-4 TIMES DAILY AS DIRECTED/NEEDED 200 strip 12   OVER THE COUNTER MEDICATION Health joint     Potassium 99 MG TABS Take by mouth.     pravastatin (PRAVACHOL) 20 MG tablet Take 1 tablet (20 mg total) by mouth daily. 90 tablet 3   pregabalin (LYRICA) 100 MG capsule Take 1 capsule (100 mg total) by mouth at bedtime. (Patient not taking: Reported on 08/12/2021) 90 capsule 3   Semaglutide (RYBELSUS) 3 MG TABS Take 3 mg by mouth daily. 30 tablet 2   No current facility-administered medications on file prior to visit.   Allergies  Allergen Reactions   Pollen Extract Itching   Citrus Itching, Rash and Swelling   Ibuprofen Swelling and  Rash    Had a reaction to ibuprofen x1 but reports she is able to take aspirin   Other Itching, Rash and Swelling    seafood. seafood   Family History  Problem Relation Age of Onset   Hypertension Sister        sister, daughter , mother    Hyperlipidemia Sister    Thyroid disease Sister    Diabetes Daughter    Stroke Mother    Colon cancer Neg Hx    Sudden death Neg Hx    Heart attack Neg Hx    Breast cancer Neg Hx    Stomach cancer Neg Hx    PE: BP 128/76 (BP Location: Left Arm, Patient Position: Sitting, Cuff Size: Normal)   Pulse 76   Ht 5' 6" (1.676 m)   Wt 159 lb 9.6 oz (72.4 kg)   SpO2 98%   BMI 25.76 kg/m  Wt Readings from Last 3 Encounters:  08/25/21 159 lb 9.6 oz (72.4 kg)  08/12/21 160 lb 8 oz (72.8 kg)  07/29/21 158 lb 6.4 oz (71.8 kg)   Constitutional: normal weight, in NAD Eyes: no exophthalmos ENT: moist mucous membranes, no masses palpated in neck, no cervical lymphadenopathy Cardiovascular: RRR, No MRG Respiratory: CTA B Musculoskeletal: no deformities Skin: moist, warm, no rashes Neurological: no tremor with outstretched hands  ASSESSMENT: 1. DM2, non-insulin-dependent, now more controlled, without complications  2. History of hyperparathyroidism -  status post 2 gland parathyroidectomy in 2015   3. Vit D def  4.  History of elevated B12 vitamin  5.  History of elevated TSH  PLAN:  1.  Patient with previously controlled type 2 diabetes, on oral antidiabetic regimen with metformin ER and now oral GLP-1 receptor agonist, recommended at last visit.  At that time, HbA1c was high, at 8%.  Her sugars were higher after having had problems after dental surgery 3 months prior and having been on steroids.  She ended up needing 2 more surgeries afterwards.  The sugars at last visit were better than expected from the HbA1c.  At that time, she was forgetting to take Prandin.  I recommended Rybelsus low-dose and increase as needed.  We continued metformin. -At  today's visit, sugars are at goal despite the fact that she stopped Rybelsus after the first week due to diarrhea.  She also mentions low blood sugars on it.  We will continue without it for now.  We will continue metformin. - I suggested to:  Patient Instructions  Please continue: - Metformin ER 500 mg 3x a day  Also, continue: - vitamin D 5000 units daily  Please return in 4 months with your sugar log.   - we checked her HbA1c: 6.4% (much improved) - advised to check sugars at different times of the day - 1x a day, rotating check times - advised for yearly eye exams >> she is UTD - at last visit I recommended to restart a statin, after discussion about risks and benefits: Pravastatin 20 mg daily.  She did not start it as she had 2 friends that did not do good on statins.  We discussed that this is a very biased point of view, but for now, we will continue without it. - return to clinic in 4 months  2. History of primary hyperparathyroidism -She is status post parathyroidectomy -In 2020, she was in the emergency room with paresthesia and pain in the right arm and was found to have a minimally low calcium, at 8.7.  Afterwards, calcium improved and PTH and vitamin D level were normal -I previously recommended to take 1 Tums tablet with dinner but she has not taken this. -Latest calcium level was normal: Lab Results  Component Value Date   CALCIUM 9.0 07/20/2021   PHOS 4.4 01/16/2019   3. Vit D def -Continues on 5000 units vitamin D daily -Vitamin D level was normal on this dose: Lab Results  Component Value Date   VD25OH 42.17 04/14/2021   VD25OH 40 10/17/2019   VD25OH 31.28 01/16/2019   4.  Elevated B12 vitamin -She had a history of an elevated B12 level, but latest levels were normal Lab Results  Component Value Date   VITAMINB12 595 07/20/2021   VITAMINB12 507 04/14/2021   VITAMINB12 560 01/16/2020   5. Elevated TSH -At last visit, TSH was normal, close to the upper  limit of the target range, and the free T4 was slightly low. -At that time, we discussed about possibly starting a low-dose levothyroxine, but we ended up not using it, pending repeat labs -She had another TSH approximately a month ago and this was excellent: Lab Results  Component Value Date   TSH 2.17 07/20/2021  -We will continue to follow her expectantly  Philemon Kingdom, MD PhD [facility]

## 2021-09-03 ENCOUNTER — Encounter: Payer: Self-pay | Admitting: Medical

## 2021-09-03 ENCOUNTER — Ambulatory Visit (INDEPENDENT_AMBULATORY_CARE_PROVIDER_SITE_OTHER): Payer: Medicare Other | Admitting: Medical

## 2021-09-03 ENCOUNTER — Other Ambulatory Visit (HOSPITAL_BASED_OUTPATIENT_CLINIC_OR_DEPARTMENT_OTHER): Payer: Self-pay

## 2021-09-03 VITALS — BP 136/70 | HR 73 | Temp 98.2°F | Ht 67.0 in | Wt 160.8 lb

## 2021-09-03 DIAGNOSIS — T7840XA Allergy, unspecified, initial encounter: Secondary | ICD-10-CM | POA: Diagnosis not present

## 2021-09-03 DIAGNOSIS — R21 Rash and other nonspecific skin eruption: Secondary | ICD-10-CM | POA: Diagnosis not present

## 2021-09-03 DIAGNOSIS — L089 Local infection of the skin and subcutaneous tissue, unspecified: Secondary | ICD-10-CM | POA: Diagnosis not present

## 2021-09-03 MED ORDER — HYDROXYZINE HCL 10 MG PO TABS
ORAL_TABLET | ORAL | 0 refills | Status: DC
  Filled 2021-09-03: qty 20, 10d supply, fill #0

## 2021-09-03 MED ORDER — CEPHALEXIN 500 MG PO CAPS
500.0000 mg | ORAL_CAPSULE | Freq: Two times a day (BID) | ORAL | 0 refills | Status: DC
  Filled 2021-09-03: qty 20, 10d supply, fill #0

## 2021-09-03 MED ORDER — TRIAMCINOLONE ACETONIDE 0.1 % EX CREA
1.0000 | TOPICAL_CREAM | Freq: Two times a day (BID) | CUTANEOUS | 0 refills | Status: DC
  Filled 2021-09-03: qty 30, 15d supply, fill #0

## 2021-09-03 NOTE — Patient Instructions (Addendum)
Recurrent allergic reaction but to new hair dye product. Recommend avoiding all type products in the future. I think acute severe phase of reaction resolved and don't think super potent steroid needed. But can give medium potency steroid.  Some possible skin infection secondary to rash and itching as well. Rx keflex.   Referral to both dermatologist and allergist placed as well.  For itching at night refilling the hyroxyzine to use at night.  Follow up in 10 days pcp or sooner if needed.

## 2021-09-03 NOTE — Progress Notes (Signed)
Subjective:    Patient ID: Abigail [name], female    DOB: [birth year], 71 y.o.   MRN: [id number]  HPI  Pt in for evaluation.  Pt states she recently tried a hair dye product. She states she tested the area on her rt wrist and below her ears. She states the area itched a lot.    Pt has tried to use various topical medications. She tried to use hydrocortisone, benadryl, bacitracin and topical itch reflief. Also states vaseline helped a lot.  She states hydrocortisone made worse.  Benadryl topical did help and tried   Pt knows dermatologist in [place]. Very good friend of hers. This dermatologist recommended betamethsone ointment.   States this occurred 10 days ago.   Pt also had reaction to other product in [place].  She has pain and soreness to area on wrist and on rash area behind her ears.   Review of Systems  Constitutional:  Negative for chills, fatigue and fever.  Respiratory:  Negative for cough, chest tightness, shortness of breath and wheezing.   Cardiovascular:  Negative for chest pain and palpitations.  Gastrointestinal:  Negative for abdominal pain.  Genitourinary:  Negative for dyspareunia.  Musculoskeletal:  Negative for back pain and joint swelling.    Past Medical History:  Diagnosis Date   Asthma    Chiari malformation type I ([place])    Diabetes mellitus    GERD (gastroesophageal reflux disease)    Hx of gout    Hyperlipidemia    Hyperparathyroidism ([facility])    Hypertension    Lumbar radiculopathy    Postablative hypothyroidism    Sickle cell anemia ([facility])    Spondylolysis    Thyroid disease      Social History   Socioeconomic History   Marital status: Married    Spouse name: Not on file   Number of children: 1   Years of education: Not on file   Highest education level: Not on file  Occupational History   Occupation: retired, still works part Public house manager , high school  Tobacco Use   Smoking status: Never   Smokeless tobacco: Never   Vaping Use   Vaping Use: Never used  Substance and Sexual Activity   Alcohol use: No   Drug use: No   Sexual activity: Not Currently    Partners: Male    Comment: 1st intercourse- 89, married- 56 yrs   Other Topics Concern   Not on file  Social History Narrative   Original from [place]   Married     1 daughter in [place]   Jehovah witness : no transfusions    Social Determinants of Radio broadcast assistant Strain: Not on file  Food Insecurity: Not on file  Transportation Needs: Not on file  Physical Activity: Not on file  Stress: Not on file  Social Connections: Not on file  Intimate Partner Violence: Not on file    Past Surgical History:  Procedure Laterality Date   ABDOMINAL HYSTERECTOMY  1994   NO oophorectomy   BREAST BIOPSY Left 2015   [facility]    CATARACT EXTRACTION Right 08/2019   CERVICAL LAMINECTOMY     at C1 w/ duraplasty   CRANIECTOMY SUBOCCIPITAL W/ CERVICAL LAMINECTOMY / CHIARI  07/05/2011   At C1, performed at [place]  2015   [facility]     Family History  Problem Relation Age of Onset   Hypertension Sister        sister, daughter , mother  Hyperlipidemia Sister    Thyroid disease Sister    Diabetes Daughter    Stroke Mother    Colon cancer Neg Hx    Sudden death Neg Hx    Heart attack Neg Hx    Breast cancer Neg Hx    Stomach cancer Neg Hx     Allergies  Allergen Reactions   Pollen Extract Itching   Citrus Itching, Rash and Swelling   Ibuprofen Swelling and Rash    Had a reaction to ibuprofen x1 but reports she is able to take aspirin   Other Itching, Rash and Swelling    seafood. seafood    Current Outpatient Medications on File Prior to Visit  Medication Sig Dispense Refill   acetaminophen (TYLENOL) 500 MG tablet Take 1,000 mg by mouth every 6 (six) hours as needed.     albuterol (VENTOLIN HFA) 108 (90 Base) MCG/ACT inhaler Inhale 2 puffs into the lungs every 6 (six) hours as needed for wheezing or shortness of  breath. 18 g 1   Blood Glucose Monitoring Suppl (ONETOUCH VERIO) w/Device KIT Use 1-4 times daily as needed/directed DX E11.9 1 kit 0   celecoxib (CELEBREX) 100 MG capsule Take 1 capsule (100 mg total) by mouth 2 (two) times daily as needed for moderate pain. 30 capsule 0   Cholecalciferol (VITAMIN D3) 1.25 MG (50000 UT) TABS Take by mouth.     clindamycin (CLEOCIN) 300 MG capsule Take 1 capsule by mouth twice a day for 7 days 14 capsule 2   Diclofenac Sodium (PENNSAID) 2 % SOLN Apply 2 Pump topically 2 (two) times daily. 112 g 0   fexofenadine (ALLEGRA) 180 MG tablet Take 180 mg by mouth daily.     glucose blood test strip Reported on 07/09/2015     hydrOXYzine (ATARAX) 10 MG tablet Take 1 - 2 tablets by mouth every night at bedtime as needed for itching 20 tablet 0   Lancets (ONETOUCH DELICA PLUS [id number]) MISC USE TO CHECK BLOOD SUGAR 1 TO 4 TIMES A DAY AS NEEDED AS DIRECTED 200 each 12   metFORMIN (GLUCOPHAGE-XR) 500 MG 24 hr tablet TAKE 1 TABLET BY MOUTH THREE TIMES DAILY WITH MEALS 270 tablet 3   ONETOUCH VERIO test strip USE 1-4 TIMES DAILY AS DIRECTED/NEEDED 200 strip 12   OVER THE COUNTER MEDICATION Health joint     Potassium 99 MG TABS Take by mouth.     pravastatin (PRAVACHOL) 20 MG tablet Take 1 tablet (20 mg total) by mouth daily. (Patient not taking: Reported on 09/03/2021) 90 tablet 3   pregabalin (LYRICA) 100 MG capsule Take 1 capsule (100 mg total) by mouth at bedtime. (Patient not taking: Reported on 09/03/2021) 90 capsule 3   Semaglutide (RYBELSUS) 3 MG TABS Take 3 mg by mouth daily. (Patient not taking: Reported on 09/03/2021) 30 tablet 2   No current facility-administered medications on file prior to visit.    BP 136/70   Pulse 73   Temp 98.2 F (36.8 C) (Oral)   Ht $R'5\' 7"'vr$  (1.702 m)   Wt 160 lb 12.8 oz (72.9 kg)   SpO2 98%   BMI 25.18 kg/m        Objective:   Physical Exam  General Mental Status- [name]. General Appearance- Not in acute distress.   Skin Rt  wrist 12 cm x 2.5 cm mild hyperigmented area. Faint tender but not red, warm or swollen presently. 2 faint mild hyperpigmented area below and behind ear.(All in dye test area)  Also some scattered dark hyperpitmentd area on both calfs from former allergic reaction to nylon pantyhose.  Neck Carotid Arteries- Normal color. Moisture- Normal Moisture. No carotid bruits. No JVD.  Chest and Lung Exam Auscultation: Breath Sounds:-Normal.  Cardiovascular Auscultation:Rythm- Regular. Murmurs & Other Heart Sounds:Auscultation of the heart reveals- No Murmurs.  Abdomen Inspection:-Inspeection Normal. Palpation/Percussion:Note:No mass. Palpation and Percussion of the abdomen reveal- Non Tender, Non Distended + BS, no rebound or guarding.   Neurologic Cranial Nerve exam:- CN III-XII intact(No nystagmus), symmetric smile. Strength:- 5/5 equal and symmetric strength both upper and lower extremities.       Assessment & Plan:   Patient Instructions  Recurrent allergic reaction but to new hair dye product. Recommend avoiding all type products in the future. I think acute severe phase of reaction resolved and don't think super potent steroid needed. But can give medium potency steroid.  Some possible skin infection secondary to rash and itching as well. Rx keflex.   Referral to both dermatologist and allergist placed as well.  Follow up in 10 days pcp or sooner if needed.

## 2021-09-14 ENCOUNTER — Other Ambulatory Visit: Payer: Self-pay | Admitting: Internal Medicine

## 2021-09-14 ENCOUNTER — Other Ambulatory Visit (HOSPITAL_BASED_OUTPATIENT_CLINIC_OR_DEPARTMENT_OTHER): Payer: Self-pay

## 2021-09-14 MED ORDER — METFORMIN HCL ER 500 MG PO TB24
ORAL_TABLET | Freq: Three times a day (TID) | ORAL | 3 refills | Status: DC
  Filled 2021-09-14: qty 270, 90d supply, fill #0
  Filled 2021-12-02: qty 270, 90d supply, fill #1
  Filled 2022-02-25: qty 270, 90d supply, fill #2
  Filled 2022-05-21: qty 270, 90d supply, fill #3

## 2021-09-18 ENCOUNTER — Encounter: Payer: Self-pay | Admitting: Internal Medicine

## 2021-10-15 ENCOUNTER — Other Ambulatory Visit (HOSPITAL_BASED_OUTPATIENT_CLINIC_OR_DEPARTMENT_OTHER): Payer: Self-pay

## 2021-10-15 DIAGNOSIS — L810 Postinflammatory hyperpigmentation: Secondary | ICD-10-CM | POA: Diagnosis not present

## 2021-10-15 DIAGNOSIS — L209 Atopic dermatitis, unspecified: Secondary | ICD-10-CM | POA: Diagnosis not present

## 2021-10-15 MED ORDER — BETAMETHASONE DIPROPIONATE 0.05 % EX CREA
1.0000 | TOPICAL_CREAM | Freq: Two times a day (BID) | CUTANEOUS | 4 refills | Status: DC
  Filled 2021-10-15: qty 30, 14d supply, fill #0

## 2021-10-20 ENCOUNTER — Other Ambulatory Visit (HOSPITAL_BASED_OUTPATIENT_CLINIC_OR_DEPARTMENT_OTHER): Payer: Self-pay

## 2021-10-20 ENCOUNTER — Ambulatory Visit (INDEPENDENT_AMBULATORY_CARE_PROVIDER_SITE_OTHER): Payer: Medicare Other | Admitting: Family Medicine

## 2021-10-20 ENCOUNTER — Encounter: Payer: Self-pay | Admitting: Family Medicine

## 2021-10-20 VITALS — BP 108/72 | HR 59 | Temp 98.4°F | Ht 67.0 in | Wt 161.1 lb

## 2021-10-20 DIAGNOSIS — J069 Acute upper respiratory infection, unspecified: Secondary | ICD-10-CM | POA: Diagnosis not present

## 2021-10-20 DIAGNOSIS — I889 Nonspecific lymphadenitis, unspecified: Secondary | ICD-10-CM | POA: Diagnosis not present

## 2021-10-20 LAB — POC COVID19 BINAXNOW

## 2021-10-20 LAB — POCT INFLUENZA A/B

## 2021-10-20 MED ORDER — AMOXICILLIN-POT CLAVULANATE 875-125 MG PO TABS
1.0000 | ORAL_TABLET | Freq: Two times a day (BID) | ORAL | 0 refills | Status: AC
  Filled 2021-10-20: qty 14, 7d supply, fill #0

## 2021-10-20 NOTE — Progress Notes (Signed)
Chief Complaint  Patient presents with   Cough   Sore Throat    Runny nose Diarrhea     Abigail [name] Alert here for URI complaints.  Duration: 4 days, getting worse Associated symptoms: sinus pain, rhinorrhea, itchy watery eyes, ear fullness, ear pain, myalgias, sore throat, and coughing, diarrhea (thinks it may be bc she consumed gluten) Denies: sinus congestion, ear drainage, wheezing, shortness of breath, and fevers, N/V, loss of taste/smell, abd pain Treatment to date: Tylenol Sick contacts: No Tested neg for covid.   Past Medical History:  Diagnosis Date   Asthma    Chiari malformation type I ([place])    Diabetes mellitus    GERD (gastroesophageal reflux disease)    Hx of gout    Hyperlipidemia    Hyperparathyroidism ([facility])    Hypertension    Lumbar radiculopathy    Postablative hypothyroidism    Sickle cell anemia ([facility])    Spondylolysis    Thyroid disease     Objective BP 108/72 (BP Location: Left Arm, Patient Position: Sitting, Cuff Size: Normal)   Pulse (!) 59   Temp 98.4 F (36.9 C) (Oral)   Ht 5\' 7"  (1.702 m)   Wt 161 lb 2 oz (73.1 kg)   SpO2 99%   BMI 25.24 kg/m  General: Awake, alert, appears stated age HEENT: AT, [place], ears patent b/l and TM's neg, nares patent w/o discharge, pharynx pink and without exudates, MMM Neck: Prominent cervical lymphadenopathy that is tender on the left, submandibular glands are also slightly enlarged but TTP on the left only Heart: RRR Lungs: CTAB, no accessory muscle use Psych: Age appropriate judgment and insight, normal mood and affect  Lymphadenitis - Plan: amoxicillin-clavulanate (AUGMENTIN) 875-125 MG tablet, POC COVID-19, POCT Influenza A/B  URI with cough and congestion - Plan: POC COVID-19, POCT Influenza A/B  Supportive care with Tylenol and ice.  If no improvement the next 2 days, will take Augmentin twice daily for lymphadenitis.  She tested negative for both COVID and flu.  Continue to push fluids, practice  good hand hygiene, cover mouth when coughing. F/u prn. If starting to experience fevers, shaking, or shortness of breath, seek immediate care. Pt voiced understanding and agreement to the plan.  Bella Villa, DO 10/20/21 3:01 PM

## 2021-10-20 NOTE — Patient Instructions (Addendum)
Continue to push fluids, practice good hand hygiene, and cover your mouth if you cough.  If you start having fevers, shaking or shortness of breath, seek immediate care.  OK to take Tylenol 1000 mg (2 extra strength tabs) or 975 mg (3 regular strength tabs) every 6 hours as needed.  See if this can improve with supportive care over the next 2 days. If no improvement by then, take the antibiotic.   Let us know if you need anything.

## 2021-10-29 ENCOUNTER — Encounter: Payer: Self-pay | Admitting: Allergy & Immunology

## 2021-10-29 ENCOUNTER — Other Ambulatory Visit (HOSPITAL_BASED_OUTPATIENT_CLINIC_OR_DEPARTMENT_OTHER): Payer: Self-pay

## 2021-10-29 ENCOUNTER — Ambulatory Visit: Payer: Medicare Other | Admitting: Allergy & Immunology

## 2021-10-29 VITALS — BP 118/78 | HR 71 | Temp 98.4°F | Resp 16 | Ht 66.0 in | Wt 164.0 lb

## 2021-10-29 DIAGNOSIS — L253 Unspecified contact dermatitis due to other chemical products: Secondary | ICD-10-CM

## 2021-10-29 DIAGNOSIS — J302 Other seasonal allergic rhinitis: Secondary | ICD-10-CM

## 2021-10-29 MED ORDER — PIMECROLIMUS 1 % EX CREA
1.0000 | TOPICAL_CREAM | Freq: Two times a day (BID) | CUTANEOUS | 0 refills | Status: DC
  Filled 2021-10-29: qty 30, 30d supply, fill #0

## 2021-10-29 MED ORDER — TRIAMCINOLONE ACETONIDE 0.1 % EX OINT
1.0000 | TOPICAL_OINTMENT | Freq: Two times a day (BID) | CUTANEOUS | 0 refills | Status: DC
  Filled 2021-10-29: qty 160, 90d supply, fill #0

## 2021-10-29 NOTE — Patient Instructions (Addendum)
1. Contact dermatitis due to chemicals - We are going to schedule you for patch testing, which looks for sensitization to various chemicals and fragrances. - PLEASE bring other items that you wish to test (dyes, cosmetics, etc). - This will help you to figure you what you CAN tolerate and then you can change what cosmetics you purchase and use. - These are stickers on your back that are placed on a MONDAY and then read on a WEDNESDAY and FRIDAY. - You can schedule this on your WAY OUT. - You CAN use antihistamines like Allegra or Zyrtec during this process. - BUT you cannot use steroids during this time at all. - Continue to triamcinolone ointment twice daily as needed (NOT SAFE to use on the face). - Continue with Elidel twice daily as needed (SAFE to use on the face).   2. Return in about 2 weeks (around 11/12/2021) for [place].    Please inform us of any Emergency Department visits, hospitalizations, or changes in symptoms. Call us before going to the ED for breathing or allergy symptoms since we might be able to fit you in for a sick visit. Feel free to contact us anytime with any questions, problems, or concerns.  It was a pleasure to meet you today!  Websites that have reliable patient information: 1. American Academy of Asthma, Allergy, and Immunology: www.aaaai.org 2. Food Allergy Research and Education (FARE): foodallergy.org 3. Mothers of Asthmatics: http://www.asthmacommunitynetwork.org 4. American College of Allergy, Asthma, and Immunology: www.acaai.org   COVID-19 Vaccine Information can be found at: ShippingScam.co.uk For questions related to vaccine distribution or appointments, please email [email or web address] or call [phone].   We realize that you might be concerned about having an allergic reaction to the COVID19 vaccines. To help with that concern, WE ARE OFFERING THE COVID19 VACCINES IN OUR OFFICE!  Ask the front desk for dates!     "Like" [place] on Facebook and Instagram for our latest updates!      A healthy democracy works best when [employer] participate! Make sure you are registered to vote! If you have moved or changed any of your contact information, you will need to get this updated before voting!  In some cases, you MAY be able to register to vote online: [email or web address]     True Test looks for the following sensitivities:

## 2021-10-29 NOTE — Progress Notes (Signed)
NEW PATIENT  Date of Service/Encounter:  10/29/21  Consult requested by: Colon Branch, MD   Assessment:   Contact dermatitis due to chemicals - planning for patch testing  Seasonal allergies - did not do testing since her symptoms were not too severe and did not bother her much  Plan/Recommendations:   1. Contact dermatitis due to chemicals - We are going to schedule you for patch testing, which looks for sensitization to various chemicals and fragrances. - PLEASE bring other items that you wish to test (dyes, cosmetics, etc). - This will help you to figure you what you CAN tolerate and then you can change what cosmetics you purchase and use. - These are stickers on your back that are placed on a MONDAY and then read on a WEDNESDAY and FRIDAY. - You can schedule this on your WAY OUT. - You CAN use antihistamines like Allegra or Zyrtec during this process. - BUT you cannot use steroids during this time at all. - Continue to triamcinolone ointment twice daily as needed (NOT SAFE to use on the face). - Continue with Elidel twice daily as needed (SAFE to use on the face).   2. Return in about 2 weeks (around 11/12/2021) for [place].    This note in its entirety was forwarded to the Provider who requested this consultation.  Subjective:   Abigail [name] is a 71 y.o. female presenting today for evaluation of  Chief Complaint  Patient presents with   Allergic Reaction    Had to a reaction to the hair dye She broke out all over her scalp. She has blister like breakouts all over The reaction started after 48 hours and started with itching and burning   Allergy Testing    When eating flour,cookies,pasta,breads gets inflammation and diarrhea. 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   Age-related macular degeneration 12/19/2019   Cervical myofascial pain syndrome 03/07/2019   Reactive depression 08/09/2017   Somatic complaints, multiple 08/09/2017   Annual physical exam 08/27/2015   Type 2 diabetes mellitus with hyperglycemia, without long-term current use of insulin ([place]) 05/06/2015   H/o Primary hyperparathyroidism ([place]) 05/06/2015   PCP NOTES >>>>> 01/23/2015   Chiari malformation type I ([place])    Sickle cell anemia ([place])    Lumbar radiculopathy    Spondylolysis    Hx of gout    Thyroid disorder 01/02/2015   Allergic rhinitis 01/02/2015    History obtained from: chart review and patient.  Abigail [name] was referred by Colon Branch, MD.     Abigail [name] is a 71 y.o. female presenting for an evaluation of multiple rashes .   Asthma/Respiratory Symptom History: She had asthma when she was a child.  She has not needed an inhaler in years.  Allergic Rhinitis Symptom History: She has had allergies since she was a child . She uses Allegra occasionally for the pollen season.  She has never been allergy tested.  Skin Symptom History: She reports that she has reacted to a particular hair dye.  She reports that she  started using this in June and started having reactions to it.     She has some lesions on her right leg. She is not sure what caused it.  She thinks that she reacted to Nylon. She feels that she is not dressed without Nylons. She reports a lot pruritis with this as well as lesions with liquid inside. She tried using Allegra witehout improvement. Cream at home did not work at all. She got some triamcinolone that did provide a lot of relief.  She had a round of prednisone and antibiotics to help cure this (prednisone and  Keflex).  She has some hypopigmentation over her entire body.    She also has another that she testing on her arm before she used it.  It caused a blistering skin rash.  She does show me a rather gnarly picture of an area approximately 5 cm x 5 cm on her right forearm with multiple blisters.  She has never had patch testing before.  Otherwise, there is no history of other atopic diseases, including asthma, food allergies, drug allergies, stinging insect allergies, or contact dermatitis. There is no significant infectious history. Vaccinations are up to date.    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   Age-related macular degeneration 12/19/2019   Cervical myofascial pain syndrome 03/07/2019   Reactive depression 08/09/2017   Somatic complaints, multiple 08/09/2017   Annual physical exam 08/27/2015   Type 2 diabetes mellitus with hyperglycemia, without long-term current use of insulin ([place]) 05/06/2015   H/o Primary hyperparathyroidism ([place]) 05/06/2015   PCP NOTES >>>>> 01/23/2015   Chiari malformation type I ([place])    Sickle cell anemia ([facility])    Lumbar radiculopathy    Spondylolysis    Hx of gout    Thyroid disorder 01/02/2015   Allergic rhinitis 01/02/2015    Medication List:  Allergies as of 10/29/2021       Reactions   Pollen Extract Itching   Citrus Itching, Rash, Swelling   Ibuprofen Swelling, Rash   Had a reaction to ibuprofen x1 but reports she is able to take aspirin   Other Itching, Rash, Swelling   seafood. seafood        Medication List        Accurate as of October 29, 2021  1:15 PM. If you have any questions, ask your  nurse or doctor.          acetaminophen 500 MG tablet Commonly known as: TYLENOL Take 1,000 mg by mouth every 6 (six) hours as needed.   albuterol 108 (90 Base) MCG/ACT inhaler Commonly known as: VENTOLIN HFA Inhale 2 puffs into the lungs every 6 (six) hours as needed for wheezing or shortness of breath.   amoxicillin-clavulanate 875-125 MG tablet Commonly known as: AUGMENTIN Take 1 tablet by mouth 2 (two) times daily for 7 days.   betamethasone dipropionate 0.05 % cream Apply topically to the affected area twice daily for 1 to 2 weeks as needed   celecoxib 100 MG capsule Commonly known as: CeleBREX Take 1 capsule (100 mg total) by mouth 2 (two) times daily as needed for moderate pain.   diclofenac Sodium 2 % Soln  Commonly known as: Pennsaid Apply 2 Pump topically 2 (two) times daily.   fexofenadine 180 MG tablet Commonly known as: ALLEGRA Take 180 mg by mouth daily.   glucose blood test strip Reported on 07/09/2015   OneTouch Verio test strip Generic drug: glucose blood USE 1-4 TIMES DAILY AS DIRECTED/NEEDED   hydrOXYzine 10 MG tablet Commonly known as: ATARAX Take 1-2 tablets by mouth at bedtime as needed for itching/insomnia   metFORMIN 500 MG 24 hr tablet Commonly known as: GLUCOPHAGE-XR TAKE 1 TABLET BY MOUTH THREE TIMES DAILY WITH MEALS   OneTouch Delica Plus [id number] Misc USE TO CHECK BLOOD SUGAR 1 TO 4 TIMES A DAY AS NEEDED AS DIRECTED   OneTouch Verio w/Device Kit Use 1-4 times daily as needed/directed DX E11.9   OVER THE COUNTER MEDICATION Health joint   pimecrolimus 1 % cream Commonly known as: Elidel Apply 1 Application topically 2 (two) times daily. Ok to use on the face. Started by: Valentina Shaggy, MD   Potassium 99 MG Tabs Take by mouth.   pravastatin 20 MG tablet Commonly known as: PRAVACHOL Tome 1 tableta (20 mg en total) por va oral diariamente. (Take 1 tablet (20 mg total) by mouth daily.)   pregabalin 100 MG  capsule Commonly known as: LYRICA Take 1 capsule (100 mg total) by mouth at bedtime.   Rybelsus 3 MG Tabs Generic drug: Semaglutide Tome 3 mg por va oral diariamente. (Take 3 mg by mouth daily.)   triamcinolone cream 0.1 % Commonly known as: KENALOG Apply 1 Application topically 2 (two) times daily. What changed: Another medication with the same name was added. Make sure you understand how and when to take each. Changed by: Valentina Shaggy, MD   triamcinolone ointment 0.1 % Commonly known as: KENALOG Apply 1 Application topically 2 (two) times daily. What changed: You were already taking a medication with the same name, and this prescription was added. Make sure you understand how and when to take each. Changed by: Valentina Shaggy, MD   Vitamin D3 1.25 MG (50000 UT) Tabs Take by mouth.        Birth History: non-contributory  Developmental History: non-contributory  Past Surgical History: Past Surgical History:  Procedure Laterality Date   ABDOMINAL HYSTERECTOMY  1994   NO oophorectomy   BREAST BIOPSY Left 2015   [facility]    CATARACT EXTRACTION Right 08/2019   CERVICAL LAMINECTOMY     at C1 w/ duraplasty   CRANIECTOMY SUBOCCIPITAL W/ CERVICAL LAMINECTOMY / CHIARI  07/05/2011   At C1, performed at [facility]  2015   [facility]      Family History: Family History  Problem Relation Age of Onset   Hypertension Sister        sister, daughter , mother    Hyperlipidemia Sister    Thyroid disease Sister    Diabetes Daughter    Stroke Mother    Colon cancer Neg Hx    Sudden death Neg Hx    Heart attack Neg Hx    Breast cancer Neg Hx    Stomach cancer Neg Hx      Social History: Daniell lives at home with her family. They live in a condo that wads built in 2006. There is carpeting in the main living areas. There is a heat pump for heating and central cooling. There are no animals inside or outside of the home. There are not dust mite coverings  on the bedding. There is no tobacco  exposure in the home. 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 There is no fume, chemical, or dust exposure. They do not live near an interstate or industrial area.    Review of Systems  Constitutional: Negative.  Negative for chills, fever, malaise/fatigue and weight loss.  HENT: Negative.  Negative for congestion, ear discharge and ear pain.   Eyes:  Negative for pain, discharge and redness.  Respiratory:  Negative for cough, sputum production, shortness of breath and wheezing.   Cardiovascular: Negative.  Negative for chest pain and palpitations.  Gastrointestinal:  Negative for abdominal pain, constipation, diarrhea, heartburn, nausea and vomiting.  Skin:  Positive for itching and rash.  Neurological:  Negative for dizziness and headaches.  Endo/Heme/Allergies:  Negative for environmental allergies. Does not bruise/bleed easily.       Objective:   Blood pressure 118/78, pulse 71, temperature 98.4 F (36.9 C), resp. rate 16, height 5' 6" (1.676 m), weight 164 lb (74.4 kg), SpO2 100 %. Body mass index is 26.47 kg/m.     Physical Exam Vitals reviewed.  Constitutional:      Appearance: She is well-developed.  HENT:     Head: Normocephalic and atraumatic.     Right Ear: Tympanic membrane, ear canal and external ear normal. No drainage, swelling or tenderness. Tympanic membrane is not injected, scarred, erythematous, retracted or bulging.     Left Ear: Tympanic membrane, ear canal and external ear normal. No drainage, swelling or tenderness. Tympanic membrane is not injected, scarred, erythematous, retracted or bulging.     Nose: No nasal deformity, septal deviation, mucosal edema or rhinorrhea.     Right Sinus: No maxillary sinus tenderness or frontal sinus tenderness.     Left Sinus: No maxillary sinus tenderness or frontal sinus tenderness.     Mouth/Throat:     Mouth: Mucous membranes are not pale and not  dry.     Pharynx: Uvula midline.  Eyes:     General:        Right eye: No discharge.        Left eye: No discharge.     Conjunctiva/sclera: Conjunctivae normal.     Right eye: Right conjunctiva is not injected. No chemosis.    Left eye: Left conjunctiva is not injected. No chemosis.    Pupils: Pupils are equal, round, and reactive to light.  Cardiovascular:     Rate and Rhythm: Normal rate and regular rhythm.     Heart sounds: Normal heart sounds.  Pulmonary:     Effort: Pulmonary effort is normal. No tachypnea, accessory muscle usage or respiratory distress.     Breath sounds: Normal breath sounds. No wheezing, rhonchi or rales.  Chest:     Chest wall: No tenderness.  Abdominal:     Tenderness: There is no abdominal tenderness. There is no guarding or rebound.  Lymphadenopathy:     Head:     Right side of head: No submandibular, tonsillar or occipital adenopathy.     Left side of head: No submandibular, tonsillar or occipital adenopathy.     Cervical: No cervical adenopathy.  Skin:    Coloration: Skin is not pale.     Findings: Rash present. No abrasion, erythema or petechiae. Rash is purpuric and vesicular. Rash is not papular or urticarial.     Comments: Resolving rash on her right leg.  There are some areas where blisters have clearly popped and started healing.  No honey crusting or  oozing noted.  Neurological:     Mental Status: She is [name].      Diagnostic studies: none          Salvatore Marvel, MD [facility]

## 2021-10-30 ENCOUNTER — Other Ambulatory Visit (HOSPITAL_BASED_OUTPATIENT_CLINIC_OR_DEPARTMENT_OTHER): Payer: Self-pay

## 2021-11-02 ENCOUNTER — Ambulatory Visit (INDEPENDENT_AMBULATORY_CARE_PROVIDER_SITE_OTHER): Payer: Medicare Other | Admitting: Family Medicine

## 2021-11-02 ENCOUNTER — Other Ambulatory Visit (HOSPITAL_BASED_OUTPATIENT_CLINIC_OR_DEPARTMENT_OTHER): Payer: Self-pay

## 2021-11-02 ENCOUNTER — Encounter: Payer: Self-pay | Admitting: Family Medicine

## 2021-11-02 VITALS — BP 140/68 | HR 86 | Temp 97.1°F | Resp 16 | Ht 67.0 in | Wt 166.0 lb

## 2021-11-02 DIAGNOSIS — L235 Allergic contact dermatitis due to other chemical products: Secondary | ICD-10-CM

## 2021-11-02 MED ORDER — EPINEPHRINE 0.3 MG/0.3ML IJ SOAJ
0.3000 mg | Freq: Once | INTRAMUSCULAR | 2 refills | Status: AC
  Filled 2021-11-02: qty 2, 30d supply, fill #0

## 2021-11-02 NOTE — Patient Instructions (Addendum)
Allergic contact dermatitis - Instructions provided on care of the patches for the next 48 hours. Abigail [name] was instructed to avoid showering for the next 48 hours. Abigail [name] will follow up in 48 hours and 96 hours for patch readings.     Diagnostics: True Test patches placed.    Food allergy Continue to avoid shellfish.  In case of an allergic reaction, take Benadryl 50 mg capsules every 4 hours, and if life-threatening symptoms occur, inject with EpiPen 0.3 mg.  Call the clinic if this treatment plan is not working well for you  Follow up in 2 days or sooner if needed.

## 2021-11-02 NOTE — Progress Notes (Signed)
Follow-up Note  RE: Alajia Schmelzer MRN: [id number] DOB: [birth year] Date of Office Visit: 11/02/2021  Primary care provider: Colon Branch, MD Referring provider: Colon Branch, MD   Thomos Lemons returns to the office today for the patch test placement, given suspected history of contact dermatitis. At today's visit, she reports that she continues to experience intermittent facial redness, itch, and swelling. She reports that several years ago she developed tongue and throat swelling after eating shellfish for which she needed treatment in the ED. She continues to avoid shellfish, however, does not have an epinephrine auto-injector set at this time.    Diagnostics: True Test patches placed.   Plan:   Allergic contact dermatitis - Instructions provided on care of the patches for the next 48 hours. Thomos Lemons was instructed to avoid showering for the next 48 hours. Thomos Lemons will follow up in 48 hours and 96 hours for patch readings.    Food allergy Continue to avoid shellfish.  In case of an allergic reaction, take Benadryl 50 mg capsules every 4 hours, and if life-threatening symptoms occur, inject with EpiPen 0.3 mg.  Call the clinic if this treatment plan is not working well for you  Follow up in 2 days or sooner if needed.

## 2021-11-03 ENCOUNTER — Other Ambulatory Visit: Payer: Self-pay

## 2021-11-03 ENCOUNTER — Other Ambulatory Visit (HOSPITAL_BASED_OUTPATIENT_CLINIC_OR_DEPARTMENT_OTHER): Payer: Self-pay

## 2021-11-03 MED ORDER — ELIDEL 1 % EX CREA
TOPICAL_CREAM | Freq: Two times a day (BID) | CUTANEOUS | 1 refills | Status: DC
  Filled 2021-11-03: qty 30, 30d supply, fill #0

## 2021-11-03 NOTE — Progress Notes (Unsigned)
   Follow Up Note  RE: Abigail [name] MRN: [id number] DOB: [birth year] Date of Office Visit: 11/04/2021  Referring provider: Colon Branch, MD Primary care provider: Colon Branch, MD  History of Present Illness: I had the pleasure of seeing Abigail [name] for a follow up visit at the [facility] [place] [facility] on 11/04/2021. She is a 71 y.o. female, who is being followed for dermatitis. Today she is here for initial patch test interpretation, given suspected history of contact dermatitis.   Diagnostics:  TRUE TEST 48 hour reading:   T.R.U.E. Test - 11/04/21 1300       Test Information   Time Antigen Placed 1030    Manufacturer Other    Lot # [id number] Exp: 02/26/23    Location Back    Number of Test 36    Reading Interval Day 1;Day 3    Panel Panel 1;Panel 2;Panel 3      Panel 1   1. Nickel Sulfate 0    2. Wool Alcohols 0    3. Neomycin Sulfate 0    4. Potassium Dichromate 0    5. Caine Mix 0    6. Fragrance Mix 0    7. Colophony 0    8. Paraben Mix 0    9. Negative Control 0    10. Balsam of [place] 0    11. Ethylenediamine Dihydrochloride 0    12. Cobalt Dichloride 0      Panel 2   13. p-tert Butylphenol Formaldehyde Resin 0    14. Epoxy Resin 0    15. Carba Mix 0    16.  Black Rubber Mix 0    17. Cl+ Me-Isothiazolinone 0    18. Quaternium-15 0    19. Methyldibromo Glutaronitrile 0    20. p-Phenylenediamine 3   skin peeled   21. Formaldehyde 0    22. Mercapto Mix 0    23. Thimerosal 0    24. Thiuram Mix 0      Panel 3   25. Diazolidinyl Urea 0    26. Quinoline Mix 0    27. Tixocortol-21-Pivalate 0    28. Gold Sodium Thiosulfate 1    29. Imidazolidinyl Urea 0    30. Budesonide 0    31. Hydrocortisone-17-Butyrate 0    32. Mercaptobenzothiazole 0    33. Bacitracin 0    34. Parthenolide 0    35. Disperse Blue 106 0    36. 2-Bromo-2-Nitropropane-1,3-diol 0              Assessment and Plan: Abigail [name] is a 71 y.o. female with: Allergic  contact dermatitis due to other agents TRUE patches removed. 3+ to p-phenyledeiamine (skin peeled) 1+ to gold Handouts given.  The patient has been provided detailed information regarding the substances she is sensitive to, as well as products containing the substances.  Meticulous avoidance of these substances is recommended. If avoidance is not possible, the use of barrier creams or lotions is recommended.  Return in about 2 days (around 11/06/2021) for Patch reading.  It was my pleasure to see Abigail [name] today and participate in her care. Please feel free to contact me with any questions or concerns.  Sincerely,  Rexene Alberts, DO Allergy & Immunology  [facility] office: [phone] [facility] office: [place] office: [phone]

## 2021-11-04 ENCOUNTER — Encounter: Payer: Self-pay | Admitting: Allergy

## 2021-11-04 ENCOUNTER — Encounter: Payer: Medicare Other | Admitting: Allergy

## 2021-11-04 ENCOUNTER — Ambulatory Visit: Payer: Medicare Other | Admitting: Allergy

## 2021-11-04 ENCOUNTER — Telehealth: Payer: Self-pay

## 2021-11-04 ENCOUNTER — Other Ambulatory Visit (HOSPITAL_BASED_OUTPATIENT_CLINIC_OR_DEPARTMENT_OTHER): Payer: Self-pay

## 2021-11-04 VITALS — Temp 97.6°F

## 2021-11-04 DIAGNOSIS — L2389 Allergic contact dermatitis due to other agents: Secondary | ICD-10-CM

## 2021-11-04 NOTE — Assessment & Plan Note (Signed)
TRUE patches removed.  3+ to p-phenyledeiamine (skin peeled)  1+ to gold  Handouts given.

## 2021-11-04 NOTE — Telephone Encounter (Signed)
Received on base communication PA form  for [facility] filled out and faxed to [facility] for [place].

## 2021-11-05 ENCOUNTER — Other Ambulatory Visit (HOSPITAL_BASED_OUTPATIENT_CLINIC_OR_DEPARTMENT_OTHER): Payer: Self-pay

## 2021-11-05 MED ORDER — PIMECROLIMUS 1 % EX CREA
1.0000 | TOPICAL_CREAM | Freq: Two times a day (BID) | CUTANEOUS | 2 refills | Status: DC
  Filled 2021-11-05: qty 30, 30d supply, fill #0

## 2021-11-05 NOTE — Addendum Note (Signed)
Addended by: Tommas Olp B on: 11/05/2021 05:46 PM   Modules accepted: Orders

## 2021-11-05 NOTE — Telephone Encounter (Addendum)
Pimecrolimus (Elidel) 1% cream PA...  KEY: [id number] PA Case ID: [id number] - Rx#: [id number] created: 10/12/  PA has been sent to [facility] for review.  PA was APPROVED:  Approved today 11/05/21  Request Reference Number: [id number]. PIMECROLIMUS CRE 1% is approved through 01/25/2023. Your patient may now fill this prescription and it will be covered.  Called patient - DPR verified - LMOVM regarding the above notation.   Resending prescription to pharmacy.

## 2021-11-06 ENCOUNTER — Other Ambulatory Visit (HOSPITAL_BASED_OUTPATIENT_CLINIC_OR_DEPARTMENT_OTHER): Payer: Self-pay

## 2021-11-06 ENCOUNTER — Ambulatory Visit: Payer: Medicare Other | Admitting: Internal Medicine

## 2021-11-06 DIAGNOSIS — L235 Allergic contact dermatitis due to other chemical products: Secondary | ICD-10-CM

## 2021-11-06 DIAGNOSIS — L2389 Allergic contact dermatitis due to other agents: Secondary | ICD-10-CM

## 2021-11-06 NOTE — Progress Notes (Signed)
Follow Up Note  RE: Abigail [name] MRN: [id number] DOB: [birth year] Date of Office Visit: 11/06/2021  Referring provider: Colon Branch, MD Primary care provider: Colon Branch, MD  History of Present Illness: I had the pleasure of seeing [name] for a follow up visit at the [facility] on 11/06/2021. She is a 71 y.o. female, who is being followed for dermatitis. Today she is here for final patch test read given suspected history of contact dermatitis.   She reports significant itching on her back.  She wears gold jewelry without irritation but does have some fake jewelry that irritates her skin so she avoids it.    She does report the rash on her legs is getting better with the creams Dr. Ernst Bowler gave her on the first visit.   Physical Exam: GEN: alert, well developed HEENT: clear conjunctiva HEART: regular rate and rhythm, no murmur LUNGS: clear to auscultation bilaterally, no coughing, unlabored respiration ABDOMEN: soft, non distended  SKIN: hyperpigmented lesions on bilateral legs  Diagnostics:  TRUE TEST 48 hour reading:   T.R.U.E. Test - 11/06/21 1500       Test Information   Time Antigen Placed 3664    Manufacturer Other    Lot # [id number] Exp: 02/26/23    Location Back    Number of Test 36    Reading Interval Day 1;Day 3    Panel Panel 1;Panel 2;Panel 3      Panel 1   1. Nickel Sulfate 0    2. Wool Alcohols 0    3. Neomycin Sulfate 0    4. Potassium Dichromate 0    5. Caine Mix 0    6. Fragrance Mix 0    7. Colophony 0    8. Paraben Mix 0    9. Negative Control 0    10. Balsam of [place] 0    11. Ethylenediamine Dihydrochloride 0    12. Cobalt Dichloride 0      Panel 2   13. p-tert Butylphenol Formaldehyde Resin 0    14. Epoxy Resin 0    15. Carba Mix 0    16.  Black Rubber Mix 0    17. Cl+ Me-Isothiazolinone 0    18. Quaternium-15 0    19. Methyldibromo Glutaronitrile 0    20. p-Phenylenediamine 3   peeling    21. Formaldehyde 0    22. Mercapto Mix 0    23. Thimerosal 0    24. Thiuram Mix 0      Panel 3   25. Diazolidinyl Urea 0    26. Quinoline Mix 0    27. Tixocortol-21-Pivalate 0    28. Gold Sodium Thiosulfate --   +/-   29. Imidazolidinyl Urea 0    30. Budesonide 0    31. Hydrocortisone-17-Butyrate 0    32. Mercaptobenzothiazole 0    33. Bacitracin 0    34. Parthenolide 0    35. Disperse Blue 106 0    36. 2-Bromo-2-Nitropropane-1,3-diol 0              Assessment and Plan: [name] is a 71 y.o. 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 Avoid products with PPD.  This is commonly in hair products.   +/- to gold on final read, 1+ on initial read. Okay to wear jewelry that is not bothering her.   Handouts emailed from [employer].  The patient  has been provided detailed information regarding the substances she is sensitive to, as well as products containing the substances.  Meticulous avoidance of these substances is recommended. If avoidance is not possible, the use of barrier creams or lotions is recommended.   Return in about 4 weeks (around 12/04/2021).  It was my pleasure to see [name] today and participate in her care. Please feel free to contact me with any questions or concerns.  Sincerely,  Harlon Flor, MD Allergy [facility]ology

## 2021-11-06 NOTE — Patient Instructions (Addendum)
Please schedule follow up with Dr. Ernst Bowler in about 4 weeks.

## 2021-11-10 ENCOUNTER — Other Ambulatory Visit (HOSPITAL_BASED_OUTPATIENT_CLINIC_OR_DEPARTMENT_OTHER): Payer: Self-pay

## 2021-11-13 ENCOUNTER — Encounter: Payer: Medicare Other | Admitting: Internal Medicine

## 2021-11-27 ENCOUNTER — Encounter: Payer: Self-pay | Admitting: Internal Medicine

## 2021-11-27 ENCOUNTER — Ambulatory Visit (INDEPENDENT_AMBULATORY_CARE_PROVIDER_SITE_OTHER): Payer: Medicare Other | Admitting: Internal Medicine

## 2021-11-27 VITALS — BP 138/66 | HR 80 | Temp 98.0°F | Resp 16 | Ht 67.0 in | Wt 163.0 lb

## 2021-11-27 DIAGNOSIS — D649 Anemia, unspecified: Secondary | ICD-10-CM

## 2021-11-27 DIAGNOSIS — E559 Vitamin D deficiency, unspecified: Secondary | ICD-10-CM

## 2021-11-27 DIAGNOSIS — Z780 Asymptomatic menopausal state: Secondary | ICD-10-CM | POA: Diagnosis not present

## 2021-11-27 DIAGNOSIS — E785 Hyperlipidemia, unspecified: Secondary | ICD-10-CM | POA: Diagnosis not present

## 2021-11-27 DIAGNOSIS — E1165 Type 2 diabetes mellitus with hyperglycemia: Secondary | ICD-10-CM | POA: Diagnosis not present

## 2021-11-27 DIAGNOSIS — Z23 Encounter for immunization: Secondary | ICD-10-CM | POA: Diagnosis not present

## 2021-11-27 DIAGNOSIS — Z1231 Encounter for screening mammogram for malignant neoplasm of breast: Secondary | ICD-10-CM | POA: Diagnosis not present

## 2021-11-27 DIAGNOSIS — Z01419 Encounter for gynecological examination (general) (routine) without abnormal findings: Secondary | ICD-10-CM

## 2021-11-27 DIAGNOSIS — Z0000 Encounter for general adult medical examination without abnormal findings: Secondary | ICD-10-CM | POA: Diagnosis not present

## 2021-11-27 DIAGNOSIS — R197 Diarrhea, unspecified: Secondary | ICD-10-CM | POA: Diagnosis not present

## 2021-11-27 LAB — BASIC METABOLIC PANEL
BUN: 15 mg/dL (ref 6–23)
CO2: 30 mEq/L (ref 19–32)
Calcium: 9 mg/dL (ref 8.4–10.5)
Chloride: 103 mEq/L (ref 96–112)
Creatinine, Ser: 1.01 mg/dL (ref 0.40–1.20)
GFR: 56.68 mL/min — ABNORMAL LOW (ref >60.00)
Glucose, Bld: 93 mg/dL (ref 70–99)
Potassium: 4.4 mEq/L (ref 3.5–5.1)
Sodium: 140 mEq/L (ref 135–145)

## 2021-11-27 LAB — LIPID PANEL
Cholesterol: 163 mg/dL (ref 0–200)
HDL: 62.5 mg/dL (ref >39.00)
LDL Cholesterol: 77 mg/dL (ref 0–99)
NonHDL: 100.11
Total CHOL/HDL Ratio: 3
Triglycerides: 118 mg/dL (ref 0.0–149.0)
VLDL: 23.6 mg/dL (ref 0.0–40.0)

## 2021-11-27 LAB — CBC WITH DIFFERENTIAL/PLATELET
Basophils Absolute: 0 10*3/uL (ref 0.0–0.1)
Basophils Relative: 0.8 % (ref 0.0–3.0)
Eosinophils Absolute: 0.2 10*3/uL (ref 0.0–0.7)
Eosinophils Relative: 3.9 % (ref 0.0–5.0)
HCT: 37.6 % (ref 36.0–46.0)
Hemoglobin: 12.1 g/dL (ref 12.0–15.0)
Lymphocytes Relative: 37 % (ref 12.0–46.0)
Lymphs Abs: 1.9 10*3/uL (ref 0.7–4.0)
MCHC: 32.2 g/dL (ref 30.0–36.0)
MCV: 89.7 fl (ref 78.0–100.0)
Monocytes Absolute: 0.4 10*3/uL (ref 0.1–1.0)
Monocytes Relative: 6.9 % (ref 3.0–12.0)
Neutro Abs: 2.7 10*3/uL (ref 1.4–7.7)
Neutrophils Relative %: 51.4 % (ref 43.0–77.0)
Platelets: 277 10*3/uL (ref 150.0–400.0)
RBC: 4.2 Mil/uL (ref 3.87–5.11)
RDW: 13.6 % (ref 11.5–15.5)
WBC: 5.2 10*3/uL (ref 4.0–10.5)

## 2021-11-27 LAB — IRON: Iron: 51 ug/dL (ref 42–145)

## 2021-11-27 LAB — MICROALBUMIN / CREATININE URINE RATIO
Creatinine,U: 93.1 mg/dL
Microalb Creat Ratio: 0.8 mg/g (ref 0.0–30.0)
Microalb, Ur: <0.7 mg/dL (ref 0.0–1.9)

## 2021-11-27 LAB — FERRITIN: Ferritin: 25.2 ng/mL (ref 10.0–291.0)

## 2021-11-27 NOTE — Patient Instructions (Addendum)
Vaccines I recommend:  Covid booster RSV vaccine   We are referring you to gastroenterology for evaluation of diarrhea Return the stool test You are due for a mammogram later this month You can schedule a bone density test downstairs We are referring you to a new gynecologist    GO TO THE LAB : Get the blood work     [place], [place] back for   a checkup in 6 months
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
## 2021-11-28 ENCOUNTER — Encounter: Payer: Self-pay | Admitting: Internal Medicine

## 2021-11-28 NOTE — Assessment & Plan Note (Signed)
--   Td 04-2015 - pnm 23: 2016;  prevnar: 2019 - RSV discussed - S/p shingrix -  C-19 vaccines: Discussed with patient -  flu shot today Female care:   -- needs to see gyn- referral  --- MMG 11-2020 ([facility]), recommend to proceed with the next mammogram --CCS: 07-2012 , Dr Alice Reichert, per report no polyps, 10 years although was rec to start stool testing 2019. IFOB (-) 09-2019, rx iFOB again but  also I'm referring her to GI d/t diarrhea --T score -0.9 (04-2015), order a DEXA --Labs:  BMP FLP CBC, iron, ferritin --Diet and exercise discussed.    -Advance care planning: Documents scanned

## 2021-11-28 NOTE — Assessment & Plan Note (Addendum)
Here for CPX ENDO Saw endocrinology 08/25/2021, was eval regards diabetes, primary hyperparathyroidism,  vitamin D deficiency.  A1c was 6.4. Hyperlipidemia: Diet controlled, benefits of statins discussed.  She remains reluctant to take them. Dermatitis: Recently [facility]: Takes Celebrex  sporadically w/ GI precautions. Vitamin D deficiency: On supplements Diarrhea: 1 year h/I loose stools, requests further eval, to GI RTC 6 months

## 2021-11-30 ENCOUNTER — Inpatient Hospital Stay (HOSPITAL_BASED_OUTPATIENT_CLINIC_OR_DEPARTMENT_OTHER): Admission: RE | Admit: 2021-11-30 | Payer: Medicare Other | Source: Ambulatory Visit

## 2021-12-02 ENCOUNTER — Other Ambulatory Visit: Payer: Self-pay | Admitting: Internal Medicine

## 2021-12-03 ENCOUNTER — Other Ambulatory Visit: Payer: Self-pay | Admitting: Internal Medicine

## 2021-12-03 ENCOUNTER — Encounter: Payer: Self-pay | Admitting: Internal Medicine

## 2021-12-03 ENCOUNTER — Other Ambulatory Visit (HOSPITAL_BASED_OUTPATIENT_CLINIC_OR_DEPARTMENT_OTHER): Payer: Self-pay

## 2021-12-03 MED ORDER — ONETOUCH VERIO VI STRP
1.0000 | ORAL_STRIP | Freq: Every day | 12 refills | Status: AC | PRN
  Filled 2021-12-03: qty 200, 50d supply, fill #0
  Filled 2022-05-21: qty 200, 50d supply, fill #1
  Filled 2022-08-24: qty 200, 50d supply, fill #2

## 2021-12-03 MED ORDER — ONETOUCH DELICA PLUS LANCET33G MISC
1 refills | Status: AC
  Filled 2021-12-03: qty 400, 90d supply, fill #0
  Filled 2022-08-24: qty 400, 90d supply, fill #1

## 2021-12-04 ENCOUNTER — Other Ambulatory Visit (INDEPENDENT_AMBULATORY_CARE_PROVIDER_SITE_OTHER): Payer: Medicare Other

## 2021-12-04 ENCOUNTER — Other Ambulatory Visit (HOSPITAL_BASED_OUTPATIENT_CLINIC_OR_DEPARTMENT_OTHER): Payer: Self-pay

## 2021-12-04 DIAGNOSIS — R197 Diarrhea, unspecified: Secondary | ICD-10-CM | POA: Diagnosis not present

## 2021-12-04 LAB — FECAL OCCULT BLOOD, IMMUNOCHEMICAL: Fecal Occult Bld: NEGATIVE

## 2021-12-08 ENCOUNTER — Encounter: Payer: Self-pay | Admitting: General Practice

## 2021-12-10 ENCOUNTER — Other Ambulatory Visit (HOSPITAL_BASED_OUTPATIENT_CLINIC_OR_DEPARTMENT_OTHER): Payer: Medicare Other

## 2021-12-22 ENCOUNTER — Encounter: Payer: Self-pay | Admitting: Allergy & Immunology

## 2021-12-22 ENCOUNTER — Other Ambulatory Visit: Payer: Self-pay

## 2021-12-22 ENCOUNTER — Ambulatory Visit: Payer: Medicare Other | Admitting: Allergy & Immunology

## 2021-12-22 VITALS — BP 122/72 | HR 70 | Temp 97.7°F | Resp 20 | Ht 67.0 in | Wt 162.0 lb

## 2021-12-22 DIAGNOSIS — J302 Other seasonal allergic rhinitis: Secondary | ICD-10-CM

## 2021-12-22 DIAGNOSIS — L235 Allergic contact dermatitis due to other chemical products: Secondary | ICD-10-CM

## 2021-12-22 NOTE — Patient Instructions (Addendum)
1. Contact dermatitis due to chemicals (PPD and gold)  - Continue to avoid PPD in products and gold. - I would try going to some place like [employer] to see if they have any PPD free products.  - Continue to triamcinolone ointment twice daily as needed (NOT SAFE to use on the face). - Continue with Elidel twice daily as needed (SAFE to use on the face).   2. Return in about 1 year (around 12/23/2022).    Please inform us of any Emergency Department visits, hospitalizations, or changes in symptoms. Call us before going to the ED for breathing or allergy symptoms since we might be able to fit you in for a sick visit. Feel free to contact us anytime with any questions, problems, or concerns.  It was a pleasure to see you today!  Websites that have reliable patient information: 1. American Academy of Asthma, Allergy, and Immunology: www.aaaai.org 2. Food Allergy Research and Education (FARE): foodallergy.org 3. Mothers of Asthmatics: http://www.asthmacommunitynetwork.org 4. American College of Allergy, Asthma, and Immunology: www.acaai.org   COVID-19 Vaccine Information can be found at: [email or web address].[email or web address] For questions related to vaccine distribution or appointments, please email [email or web address] or call [phone].   We realize that you might be concerned about having an allergic reaction to the COVID19 vaccines. To help with that concern, WE ARE OFFERING THE COVID19 VACCINES IN OUR OFFICE! Ask the front desk for dates!     "Like" [place] on Facebook and Instagram for our latest updates!      A healthy democracy works best when [employer] participate! Make sure you are registered to vote! If you have moved or changed any of your contact information, you will need to get this updated before voting!  In some cases, you MAY be able to register to vote online:  [email or web address]

## 2021-12-22 NOTE — Progress Notes (Signed)
FOLLOW UP  Date of Service/Encounter:  12/22/21   Assessment:   Contact dermatitis due to chemicals (p-phenyledeiamine and gold)  Seasonal allergies - did not do testing since her symptoms were not too severe and did not bother her much  Plan/Recommendations:   1. Contact dermatitis due to chemicals (PPD and gold)  - Continue to avoid PPD in products and gold. - I would try going to some place like [employer] to see if they have any PPD free products.  - Continue to triamcinolone ointment twice daily as needed (NOT SAFE to use on the face). - Continue with Elidel twice daily as needed (SAFE to use on the face).   2. Return in about 1 year (around 12/23/2022).    Subjective:   Abigail [name] is a 71 y.o. 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   Age-related macular degeneration 12/19/2019   Cervical myofascial pain syndrome 03/07/2019   Reactive depression 08/09/2017   Somatic complaints, multiple 08/09/2017   Annual physical exam 08/27/2015   Type 2 diabetes mellitus with hyperglycemia, without long-term current use of insulin ([place]) 05/06/2015   H/o Primary hyperparathyroidism ([place]) 05/06/2015   PCP NOTES >>>>> 01/23/2015   Chiari malformation type I ([place])    Sickle cell anemia ([place])    Lumbar radiculopathy    Spondylolysis    Hx of gout    Thyroid disorder 01/02/2015    Allergic rhinitis 01/02/2015    History obtained from: chart review and patient.  Abigail [name] is a 71 y.o. female presenting for a follow up visit.  She was last seen in October 2023 for evaluation of contact dermatitis.  She underwent patch testing that was positive to PPD as well as gold.  At that time, we continued with triamcinolone twice daily as needed with Elidel on her face.  Since the last visit, she has done well. She was using some product for her hair that she stopped using the product. She is not using any of it and instead embracing the gray. She is wondering what she can use. I did ask her whether she received the safe list from the [facility] and she did. I offered her several options that I found on a quick internet search for PPD free hair dyes. She says that she has tried several of them and has reacted to them. She has done   She has been using Eucerin for her skin. She also has some rashes on her legs that are responsive to anti-inflammatory agents including the Elidel that was prescribed for her. Overall she is certainly doing better than she was when we first met her.   She remains on Allegra daily as need for her allergic rhinitis symptoms. She has not needed antibiotics for any sinus infections at all.   Otherwise, there have been no changes to her past medical history, surgical history,  family history, or social history.    Review of Systems  Constitutional: Negative.  Negative for chills, fever, malaise/fatigue and weight loss.  HENT: Negative.  Negative for congestion, ear discharge and ear pain.   Eyes:  Negative for pain, discharge and redness.  Respiratory:  Negative for cough, sputum production, shortness of breath and wheezing.   Cardiovascular: Negative.  Negative for chest pain and palpitations.  Gastrointestinal:  Negative for abdominal pain, diarrhea, heartburn, nausea and vomiting.  Skin:  Positive for itching and rash.  Neurological:  Negative for  dizziness and headaches.  Endo/Heme/Allergies:  Negative for environmental allergies. Does not bruise/bleed easily.       Objective:   Blood pressure 122/72, pulse 70, temperature 97.7 F (36.5 C), resp. rate 20, height _0  (1.702 m), weight 162 lb (73.5 kg), SpO2 100 %. Body mass index is 25.37 kg/m.    Physical Exam Vitals reviewed.  Constitutional:      Appearance: She is well-developed.  HENT:     Head: Normocephalic and atraumatic.     Right Ear: Tympanic membrane, ear canal and external ear normal. No drainage, swelling or tenderness. Tympanic membrane is not injected, scarred, erythematous, retracted or bulging.     Left Ear: Tympanic membrane, ear canal and external ear normal. No drainage, swelling or tenderness. Tympanic membrane is not injected, scarred, erythematous, retracted or bulging.     Nose: No nasal deformity, septal deviation, mucosal edema or rhinorrhea.     Right Turbinates: Enlarged and swollen.     Left Turbinates: Enlarged and swollen.     Right Sinus: No maxillary sinus tenderness or frontal sinus tenderness.     Left Sinus: No maxillary sinus tenderness or frontal sinus tenderness.     Mouth/Throat:     Mouth: Mucous membranes are not pale and not dry.     Pharynx: Uvula midline.  Eyes:     General:        Right eye: No discharge.        Left eye: No discharge.     Conjunctiva/sclera: Conjunctivae normal.     Right eye: Right conjunctiva is not injected. No chemosis.    Left eye: Left conjunctiva is not injected. No chemosis.    Pupils: Pupils are equal, round, and reactive to light.  Cardiovascular:     Rate and Rhythm: Normal rate and regular rhythm.     Heart sounds: Normal heart sounds.  Pulmonary:     Effort: Pulmonary effort is normal. No tachypnea, accessory muscle usage or respiratory distress.     Breath sounds: Normal breath sounds. No wheezing, rhonchi or rales.  Chest:     Chest wall: No tenderness.  Abdominal:     Tenderness:  There is no abdominal tenderness. There is no guarding or rebound.  Lymphadenopathy:     Head:     Right side of head: No submandibular, tonsillar or occipital adenopathy.     Left side of head: No submandibular, tonsillar or occipital adenopathy.     Cervical: No cervical adenopathy.  Skin:    Coloration: Skin is not pale.     Findings: Rash present. No abrasion, erythema or petechiae. Rash is purpuric and vesicular. Rash is not papular or urticarial.     Comments: Resolving rash on her right leg.  There are some areas where blisters have clearly popped and started healing.  No honey crusting or oozing noted.  Neurological:     Mental Status: She is [name].  Psychiatric:  Behavior: Behavior is cooperative.      Diagnostic studies: none        Salvatore Marvel, MD  [facility]

## 2021-12-24 ENCOUNTER — Encounter: Payer: Self-pay | Admitting: Allergy & Immunology

## 2021-12-30 ENCOUNTER — Encounter: Payer: Self-pay | Admitting: Medical

## 2021-12-30 ENCOUNTER — Other Ambulatory Visit (HOSPITAL_BASED_OUTPATIENT_CLINIC_OR_DEPARTMENT_OTHER): Payer: Self-pay

## 2021-12-30 ENCOUNTER — Ambulatory Visit (INDEPENDENT_AMBULATORY_CARE_PROVIDER_SITE_OTHER): Payer: Medicare Other | Admitting: Medical

## 2021-12-30 ENCOUNTER — Ambulatory Visit (HOSPITAL_BASED_OUTPATIENT_CLINIC_OR_DEPARTMENT_OTHER)
Admission: RE | Admit: 2021-12-30 | Discharge: 2021-12-30 | Disposition: A | Discharge status: Home / Self Care | Payer: Medicare Other | Source: Ambulatory Visit | Attending: Medical | Admitting: Medical

## 2021-12-30 VITALS — BP 126/72 | HR 70 | Temp 98.5°F | Resp 18 | Ht 67.0 in | Wt 160.8 lb

## 2021-12-30 DIAGNOSIS — M791 Myalgia, unspecified site: Secondary | ICD-10-CM | POA: Diagnosis not present

## 2021-12-30 DIAGNOSIS — R059 Cough, unspecified: Secondary | ICD-10-CM | POA: Diagnosis not present

## 2021-12-30 DIAGNOSIS — M2550 Pain in unspecified joint: Secondary | ICD-10-CM

## 2021-12-30 MED ORDER — FLUTICASONE PROPIONATE 50 MCG/ACT NA SUSP
2.0000 | Freq: Every day | NASAL | 1 refills | Status: DC
  Filled 2021-12-30: qty 16, 28d supply, fill #0
  Filled 2022-02-25: qty 16, 28d supply, fill #1

## 2021-12-30 MED ORDER — BENZONATATE 100 MG PO CAPS
100.0000 mg | ORAL_CAPSULE | Freq: Three times a day (TID) | ORAL | 0 refills | Status: DC | PRN
  Filled 2021-12-30: qty 30, 10d supply, fill #0

## 2021-12-30 MED ORDER — AZITHROMYCIN 250 MG PO TABS
ORAL_TABLET | ORAL | 0 refills | Status: AC
  Filled 2021-12-30: qty 6, 5d supply, fill #0

## 2021-12-30 NOTE — Patient Instructions (Addendum)
3 weeks or uri type symptoms but now worse with bronchitis and sinus infection type symptoms.   Will rx benzonatate for cough, flonase for nasal congestion and azithromycin antibiotic.  Cxr today  Both flu and covid test negative today.  Since you do have body aches with some joint pains will get inflammatory lab studies. Ordered as future. Please get scheduled to get those done.   Follow up 10-14 days or sooner if needed

## 2021-12-30 NOTE — Progress Notes (Signed)
Subjective:    Patient ID: Abigail [name], female    DOB: 27-Jul-1950, 71 y.o.   MRN: [id number]  HPI  Pt in feeling sick for 3 weeks.   She states started out cough, congestion, runny nose and sneezing. Monday some loose stools but none since.   Some ear pain this week.  When coughs will bring up mucus.   Pt states some bodyaches/joint pains.  She stats she saw Dr. Nani [name] 3 weeks ago?    Review of Systems  Constitutional:  Negative for chills, fatigue and fever.  HENT:  Negative for congestion, ear pain and facial swelling.   Respiratory:  Negative for cough, chest tightness, shortness of breath and wheezing.   Cardiovascular:  Negative for chest pain and palpitations.  Genitourinary:  Negative for dysuria.  Musculoskeletal:  Negative for back pain.  Neurological:  Negative for dizziness, syncope, weakness, numbness and headaches.  Hematological:  Negative for adenopathy. Does not bruise/bleed easily.  Psychiatric/Behavioral:  Negative for behavioral problems and decreased concentration.     Past Medical History:  Diagnosis Date   Asthma    Chiari malformation type I ([place])    Diabetes mellitus    GERD (gastroesophageal reflux disease)    Hx of gout    Hyperlipidemia    Hyperparathyroidism (HCC)    Hypertension    Lumbar radiculopathy    Postablative hypothyroidism    Sickle cell anemia (HCC)    Spondylolysis    Thyroid disease      Social History   Socioeconomic History   Marital status: Married    Spouse name: Not on file   Number of children: 1   Years of education: Not on file   Highest education level: Not on file  Occupational History   Occupation: retired, still works part Public house manager , high school  Tobacco Use   Smoking status: Never   Smokeless tobacco: Never  Vaping Use   Vaping Use: Never used  Substance and Sexual Activity   Alcohol use: No   Drug use: No   Sexual activity: Not Currently    Partners: Male    Comment: 1st  intercourse- 10, married- 58 yrs   Other Topics Concern   Not on file  Social History Narrative   Original from [place]   Married     1 daughter in [place]   Jehovah witness : no transfusions    Social Determinants of Radio broadcast assistant Strain: Not on file  Food Insecurity: Not on file  Transportation Needs: Not on file  Physical Activity: Not on file  Stress: Not on file  Social Connections: Not on file  Intimate Partner Violence: Not on file    Past Surgical History:  Procedure Laterality Date   ABDOMINAL HYSTERECTOMY  1994   NO oophorectomy   BREAST BIOPSY Left 2015   [facility]    CATARACT EXTRACTION Right 08/2019   CERVICAL LAMINECTOMY     at C1 w/ duraplasty   CRANIECTOMY SUBOCCIPITAL W/ CERVICAL LAMINECTOMY / CHIARI  07/05/2011   At C1, performed at [facility]  2015   [facility]     Family History  Problem Relation Age of Onset   Hypertension Sister        sister, daughter , mother    Hyperlipidemia Sister    Thyroid disease Sister    Diabetes Daughter    Stroke Mother    Colon cancer Neg Hx    Sudden death Neg Hx  Heart attack Neg Hx    Breast cancer Neg Hx    Stomach cancer Neg Hx     Allergies  Allergen Reactions   Pollen Extract Itching   Citrus Itching, Rash and Swelling   Ibuprofen Swelling and Rash    Had a reaction to ibuprofen x1 but reports she is able to take aspirin   Other Itching, Rash and Swelling    seafood. seafood    Current Outpatient Medications on File Prior to Visit  Medication Sig Dispense Refill   acetaminophen (TYLENOL) 500 MG tablet Take 1,000 mg by mouth every 6 (six) hours as needed.     albuterol (VENTOLIN HFA) 108 (90 Base) MCG/ACT inhaler Inhale 2 puffs into the lungs every 6 (six) hours as needed for wheezing or shortness of breath. 18 g 1   BioGaia Probiotic (BIOGAIA/GERBER SOOTHE) LIQD Take 8 drops by mouth in the morning, at noon, and at bedtime. Mix in 8 oz of h20     Biotin 10000 MCG TABS Take  by mouth.     Blood Glucose Monitoring Suppl (ONETOUCH VERIO) w/Device KIT Use 1-4 times daily as needed/directed DX E11.9 1 kit 0   celecoxib (CELEBREX) 100 MG capsule Take 1 capsule (100 mg total) by mouth 2 (two) times daily as needed for moderate pain. 30 capsule 0   Cholecalciferol (VITAMIN D3) 1.25 MG (50000 UT) TABS Take by mouth.     Cobalamin Combinations (N-16 + FOLIC ACID PO) Take by mouth.     ELIDEL 1 % cream Apply topically 2 (two) times daily. 30 g 1   fexofenadine (ALLEGRA) 180 MG tablet Take 180 mg by mouth daily.     glucose blood test strip Reported on 07/09/2015     hydrOXYzine (ATARAX) 10 MG tablet Take 1-2 tablets by mouth at bedtime as needed for itching/insomnia 20 tablet 0   Lancets (ONETOUCH DELICA PLUS [id number]) MISC USE TO CHECK BLOOD SUGAR 1 TO 4 TIMES A DAY AS NEEDED AS DIRECTED 400 each 1   loperamide HCl (IMODIUM) 1 MG/7.5ML suspension Take 2 mg by mouth 3 (three) times daily as needed for diarrhea or loose stools.     metFORMIN (GLUCOPHAGE-XR) 500 MG 24 hr tablet TAKE 1 TABLET BY MOUTH THREE TIMES DAILY WITH MEALS 270 tablet 3   ONETOUCH VERIO test strip USE 1 TO 4 TIMES DAILY AS DIRECTED/NEEDED. 200 each 12   pimecrolimus (ELIDEL) 1 % cream Apply 1 Application topically 2 (two) times daily. Ok to use on the face. 30 g 2   Potassium 99 MG TABS Take by mouth.     triamcinolone ointment (KENALOG) 0.1 % Apply 1 Application topically 2 (two) times daily. 454 g 0   No current facility-administered medications on file prior to visit.    BP 126/72 (BP Location: Left Arm, Patient Position: Sitting, Cuff Size: Normal)   Pulse 70   Temp 98.5 F (36.9 C) (Oral)   Resp 18   Ht _0  (1.702 m)   Wt 160 lb 12.8 oz (72.9 kg)   SpO2 98%   BMI 25.18 kg/m        Objective:   Physical Exam  General Mental Status- [name]. General Appearance- Not in acute distress.   Skin General: Color- Normal Color. Moisture- Normal Moisture.  Neck Carotid Arteries- Normal  color. Moisture- Normal Moisture. No carotid bruits. No JVD.  Chest and Lung Exam Auscultation: Breath Sounds:-Normal.  Cardiovascular Auscultation:Rythm- Regular. Murmurs & Other Heart Sounds:Auscultation of the heart reveals- No  Murmurs.  Abdomen Inspection:-Inspeection Normal. Palpation/Percussion:Note:No mass. Palpation and Percussion of the abdomen reveal- Non Tender, Non Distended + BS, no rebound or guarding.   Neurologic Cranial Nerve exam:- CN III-XII intact(No nystagmus), symmetric smile. Drift Test:- No drift. Romberg Exam:- Negative.  Heal to Toe Gait exam:-Normal. Finger to Nose:- Normal/Intact Strength:- 5/5 equal and symmetric strength both upper and lower extremities.   Heent- maxillary sinus pressure to palpation. Poserior phraynx normal. Both tm normal. Canals clear.     Assessment & Plan:   Patient Instructions  3 weeks or uri type symptoms but now worse with bronchitis type symptoms.   Will rx benzonatate for cough, flonase for nasal congestion and azithromycin antibiotic.  Cxr today  Both flu and covid test negative today.  Since you do have body aches with some joint pains will get inflammatory lab studies.   Follow up 10-14 days or sooner if needed   [employer], PA-C

## 2022-01-04 ENCOUNTER — Ambulatory Visit (HOSPITAL_BASED_OUTPATIENT_CLINIC_OR_DEPARTMENT_OTHER)
Admission: RE | Admit: 2022-01-04 | Discharge: 2022-01-04 | Disposition: A | Discharge status: Home / Self Care | Payer: Medicare Other | Source: Ambulatory Visit | Attending: Internal Medicine | Admitting: Internal Medicine

## 2022-01-04 ENCOUNTER — Encounter (HOSPITAL_BASED_OUTPATIENT_CLINIC_OR_DEPARTMENT_OTHER): Payer: Self-pay

## 2022-01-04 DIAGNOSIS — Z780 Asymptomatic menopausal state: Secondary | ICD-10-CM | POA: Diagnosis not present

## 2022-01-04 DIAGNOSIS — E559 Vitamin D deficiency, unspecified: Secondary | ICD-10-CM | POA: Diagnosis not present

## 2022-01-04 DIAGNOSIS — M85851 Other specified disorders of bone density and structure, right thigh: Secondary | ICD-10-CM | POA: Diagnosis not present

## 2022-01-04 DIAGNOSIS — Z1231 Encounter for screening mammogram for malignant neoplasm of breast: Secondary | ICD-10-CM | POA: Insufficient documentation

## 2022-01-05 ENCOUNTER — Other Ambulatory Visit: Payer: Medicare Other

## 2022-02-01 ENCOUNTER — Encounter: Payer: Self-pay | Admitting: Obstetrics and Gynecology

## 2022-02-01 ENCOUNTER — Ambulatory Visit (INDEPENDENT_AMBULATORY_CARE_PROVIDER_SITE_OTHER): Payer: Medicare Other | Admitting: Obstetrics and Gynecology

## 2022-02-01 VITALS — BP 135/51 | HR 64 | Ht 67.0 in | Wt 164.0 lb

## 2022-02-01 DIAGNOSIS — Z01419 Encounter for gynecological examination (general) (routine) without abnormal findings: Secondary | ICD-10-CM | POA: Diagnosis not present

## 2022-02-01 NOTE — Progress Notes (Signed)
ANNUAL EXAM Patient name: Abigail [name] MRN [id number]  Date of birth: 15-Mar-1950 Chief Complaint:   Annual Exam  History of Present Illness:   Abigail [name] is a 72 y.o. G1P1 being seen today for a routine annual exam.  Current complaints: none   Doing well. Not currently sexually active. No pelvic pain or abnormal vaginal discharge. No breast or nipple changes. Recent mammo negative and due for colonoscopy. Osteopenia (femur neck) DEXA 11/2021  No LMP recorded. Patient has had a hysterectomy.   The pregnancy intention screening data noted above was reviewed. Potential methods of contraception were discussed. The patient elected to proceed with No data recorded.   Last pap 08/2016. Results were: NILM w/ HRHPV negative. H/O abnormal pap: no - s/p hysterectomy  Last mammogram: 12/2021. Results were: normal. Family h/o breast cancer: no Last colonoscopy: ordered     12/30/2021    3:47 PM 11/27/2021    1:19 PM 09/03/2021    8:14 AM 08/12/2021    3:39 PM 06/26/2021    3:18 PM  Depression screen PHQ 2/9  Decreased Interest 0 0 0 0 0  Down, Depressed, Hopeless 0 0 0 0 0  PHQ - 2 Score 0 0 0 0 0  Altered sleeping    0 0  Tired, decreased energy    1 1  Change in appetite    0 0  Feeling bad or failure about yourself     0 0  Trouble concentrating    1 1  Moving slowly or fidgety/restless    0 0  Suicidal thoughts    0 0  PHQ-9 Score    2 2         No data to display           Review of Systems:   Pertinent items are noted in HPI Denies any headaches, blurred vision, fatigue, shortness of breath, chest pain, abdominal pain, abnormal vaginal discharge/itching/odor/irritation, problems with periods, bowel movements, urination, or intercourse unless otherwise stated above. Pertinent History Reviewed:  Reviewed past medical,surgical, social and family history.  Reviewed problem list, medications and allergies. Physical Assessment:   Vitals:   02/01/22 0831   Weight: 164 lb (74.4 kg)  Body mass index is 25.69 kg/m.        Physical Examination:   General appearance - well appearing, and in no distress  Mental status - alert, oriented to person, place, and time  Psych:  She has a normal mood and affect  Skin - warm and dry, normal color, no suspicious lesions noted  Chest - effort normal, all lung fields clear to auscultation bilaterally  Heart - normal rate and regular rhythm  Breasts - breasts appear normal, no suspicious masses, no skin or nipple changes or  axillary nodes  Abdomen - soft, nontender, nondistended, no masses or organomegaly  Pelvic -  VULVA: normal appearing vulva with no masses, tenderness or lesions   VAGINA: normal appearing vagina, small urethra caruncle, atrophic introitus   Extremities:  No swelling or varicosities noted  Chaperone present for exam  No results found for this or any previous visit (from the past 24 hour(s)).    Assessment & Plan:  1. Well woman exam No concerns today. Routine exam completed. Instructed to return in 1 year for annual or sooner if issues arise.  - Ambulatory referral to Gastroenterology   Mammogram: in 1 year, or sooner if problems Colonoscopy: per GI, or sooner if problems  No orders  of the defined types were placed in this encounter.   Meds: No orders of the defined types were placed in this encounter.   Follow-up: No follow-ups on file.  Darliss Cheney, MD 02/01/2022 8:35 AM

## 2022-02-03 ENCOUNTER — Other Ambulatory Visit (HOSPITAL_BASED_OUTPATIENT_CLINIC_OR_DEPARTMENT_OTHER): Payer: Self-pay

## 2022-02-22 ENCOUNTER — Encounter: Payer: Self-pay | Admitting: Internal Medicine

## 2022-02-22 ENCOUNTER — Telehealth: Payer: Self-pay | Admitting: Internal Medicine

## 2022-02-22 VITALS — Ht 67.0 in

## 2022-02-22 DIAGNOSIS — J069 Acute upper respiratory infection, unspecified: Secondary | ICD-10-CM

## 2022-02-25 ENCOUNTER — Ambulatory Visit (INDEPENDENT_AMBULATORY_CARE_PROVIDER_SITE_OTHER): Payer: Medicare Other | Admitting: Internal Medicine

## 2022-02-25 ENCOUNTER — Encounter: Payer: Self-pay | Admitting: Internal Medicine

## 2022-02-25 ENCOUNTER — Other Ambulatory Visit (HOSPITAL_BASED_OUTPATIENT_CLINIC_OR_DEPARTMENT_OTHER): Payer: Self-pay

## 2022-02-25 VITALS — BP 130/82 | HR 64 | Temp 97.8°F | Resp 16 | Ht 67.0 in | Wt 163.5 lb

## 2022-02-25 DIAGNOSIS — U071 COVID-19: Secondary | ICD-10-CM | POA: Diagnosis not present

## 2022-02-25 LAB — POC COVID19 BINAXNOW: SARS Coronavirus 2 Ag: POSITIVE — AB

## 2022-02-25 MED ORDER — NIRMATRELVIR/RITONAVIR (PAXLOVID)TABLET
3.0000 | ORAL_TABLET | Freq: Two times a day (BID) | ORAL | 0 refills | Status: AC
  Filled 2022-02-25: qty 30, 5d supply, fill #0

## 2022-02-25 NOTE — Assessment & Plan Note (Signed)
COVID-19: Presented with above symptoms for 4 days, tested positive for COVID today. Nontoxic-appearing, vital signs stable.  She has been vaccinated before. Plan: Rest, fluids, Tylenol. Cough control with benzonatate which she has at home (states cannot tolerate Robitussin DM) Paxlovid (calculated GFR 61).  Watch for rebound symptoms. She will be contagious for 4-5 days from now. Recommend COVID booster in 1 month.  See AVS.
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
## 2022-02-25 NOTE — Patient Instructions (Signed)
  Rest, fluids , tylenol  For cough:  Benzonatate as prescribed for   For nasal congestion: -Use over-the-counter Flonase: 2 nasal sprays on each side of the nose in the morning until you feel better  -Use OTC Astepro 2 nasal sprays on each side of the nose twice daily until better  Avoid decongestants such as  Pseudoephedrine or phenylephrine   Take Paxlovid for 5 days.   Call if not gradually better over the next  10 days   Call anytime if the symptoms are severe, you have high fever, short of breath, if you have chest pain.

## 2022-02-26 ENCOUNTER — Ambulatory Visit: Payer: Medicare Other | Admitting: Internal Medicine

## 2022-03-18 DIAGNOSIS — Z79899 Other long term (current) drug therapy: Secondary | ICD-10-CM | POA: Diagnosis not present

## 2022-03-18 DIAGNOSIS — K529 Noninfective gastroenteritis and colitis, unspecified: Secondary | ICD-10-CM | POA: Diagnosis not present

## 2022-03-21 NOTE — Progress Notes (Signed)
No show

## 2022-03-25 DIAGNOSIS — R197 Diarrhea, unspecified: Secondary | ICD-10-CM | POA: Diagnosis not present

## 2022-03-25 DIAGNOSIS — K529 Noninfective gastroenteritis and colitis, unspecified: Secondary | ICD-10-CM | POA: Diagnosis not present

## 2022-03-25 LAB — HM DIABETES FOOT EXAM: HM Diabetic Foot Exam: NEGATIVE

## 2022-03-25 LAB — HEMOGLOBIN A1C: Hemoglobin A1C: 7.6

## 2022-03-26 ENCOUNTER — Other Ambulatory Visit (HOSPITAL_BASED_OUTPATIENT_CLINIC_OR_DEPARTMENT_OTHER): Payer: Self-pay

## 2022-03-26 MED ORDER — AZITHROMYCIN 500 MG PO TABS
500.0000 mg | ORAL_TABLET | Freq: Every day | ORAL | 0 refills | Status: AC
  Filled 2022-03-26: qty 3, 3d supply, fill #0

## 2022-04-07 ENCOUNTER — Ambulatory Visit: Payer: Medicare Other | Admitting: Internal Medicine

## 2022-04-07 ENCOUNTER — Encounter: Payer: Self-pay | Admitting: Internal Medicine

## 2022-04-07 VITALS — BP 120/88 | HR 75 | Ht 67.0 in | Wt 156.4 lb

## 2022-04-07 DIAGNOSIS — E210 Primary hyperparathyroidism: Secondary | ICD-10-CM | POA: Diagnosis not present

## 2022-04-07 DIAGNOSIS — E1165 Type 2 diabetes mellitus with hyperglycemia: Secondary | ICD-10-CM

## 2022-04-07 DIAGNOSIS — R7989 Other specified abnormal findings of blood chemistry: Secondary | ICD-10-CM | POA: Diagnosis not present

## 2022-04-07 DIAGNOSIS — R748 Abnormal levels of other serum enzymes: Secondary | ICD-10-CM

## 2022-04-07 DIAGNOSIS — E559 Vitamin D deficiency, unspecified: Secondary | ICD-10-CM

## 2022-04-07 LAB — T4, FREE: Free T4: 0.69 ng/dL (ref 0.60–1.60)

## 2022-04-07 LAB — TSH: TSH: 7.22 u[IU]/mL — ABNORMAL HIGH (ref 0.35–5.50)

## 2022-04-07 LAB — VITAMIN D 25 HYDROXY (VIT D DEFICIENCY, FRACTURES): VITD: 47.73 ng/mL (ref 30.00–100.00)

## 2022-04-07 LAB — T3, FREE: T3, Free: 3.1 pg/mL (ref 2.3–4.2)

## 2022-04-07 LAB — POCT GLYCOSYLATED HEMOGLOBIN (HGB A1C): Hemoglobin A1C: 6.3 % — AB (ref 4.0–5.6)

## 2022-04-07 LAB — VITAMIN B12: Vitamin B-12: 1240 pg/mL — ABNORMAL HIGH (ref 211–911)

## 2022-04-07 NOTE — Patient Instructions (Signed)
Please continue: - Metformin ER 500 mg 3x a day  Also, continue: - vitamin D 5000 units daily  Please return in 6 months with your sugar log.

## 2022-04-07 NOTE — Progress Notes (Signed)
Patient ID: Abigail [name], female   DOB: 29-Jun-1950, 72 y.o.   MRN: [id number]  HPI: Abigail [name] is a 72 y.o.-year-old female, returning for f/u for DM2, dx in ~2010, non-insulin-dependent, controlled, without complications and h/o primary HPTH.  She is the wife of Abigail [name], also my patient. Last visit 7 months ago.  Interim history: No increased urination, blurry vision. No nausea, chest pain.   She still has diarrhea, especially after having ABx recently. She had COVID-19 in 02/2022. She had Paxlovid. She still has bone pain.  She previously had diarrhea, improved after stopping Rybelsus, but exacerbated again after she developed an intestinal infection, after COVID.  DM2: Reviewed HbA1c levels: Lab Results  Component Value Date   HGBA1C 6.4 (A) 08/25/2021   HGBA1C 8 03/03/2021   HGBA1C 6.2 (A) 09/23/2020   Pt is on a regimen of: - Metformin ER 500 mg 2x >> 3x a day She could not tolerate Rybelsus >> diarrhea, lows CBGs. She was previously on Prandin 1 mg before larger meals, but she kept forgetting it.  Pt checks her sugars 1-2x a day per review of her log: - am: 58, 68-129 >> 73-133, 147 >> 81-128 >> 95-100, 115 >> 88-113, 125 - 2h after b'fast: 113, 148 >> 101-136 >> 98-104 >> 107 >> n/c  - before lunch: 99-119 >> n/c >> 215, 269 >> 121 >> n/c >> 108, 165 - 2h after lunch: 171, 226 >> 177 >> 107-215 >> 191 >> 149-200 >> 83-118 - before dinner: 111, 133 >> 101-135 >> 131, 135 >> 79 >> n/c >> 83-108 - 2h after dinner: 124-147, 161 >> 182 >> 169 >> <160 >> 121, 122, 170 - bedtime: n/c >> 132 >> n/c >> 152 >> n/c - nighttime: n/c Lowest sugar was 58 >> 73 >> 79 >> 95 >> 83 ; she has hypoglycemia awareness in the 80s. Highest sugar was269 (plantains, forgot meds) >> 191 >> 200>> 170  Glucometer: ReliOn  Pt's meals are: - Breakfast: Fruit, eggs - Lunch: chicken/fish + veggies - no red meat  - Dinner: veggies + fruit +/- chicken/fish - Snacks:  cashews, fruit (Mango)  No CKD, last BUN/creatinine:  Lab Results  Component Value Date   BUN 15 11/27/2021   CREATININE 1.01 11/27/2021   + HL; last set of lipids: Lab Results  Component Value Date   CHOL 163 11/27/2021   HDL 62.50 11/27/2021   LDLCALC 77 11/27/2021   TRIG 118.0 11/27/2021   CHOLHDL 3 11/27/2021  She is not on a statin - declined.  She was previously on pravastatin but reportedly this was stopped due to good control. She has a history of fatty liver per ultrasound from 2017. I again recommended Pravastatin at last OV >> refuses.  - last eye exam was on 08/17/2021: No DR-she had cataract surgery.   -+ Numbness and tingling in her feet.  She has a history of surgery on her toe.  Last foot exam was performed 02/2021.  H/o primary hyperparathyroidism  - s/p 2x parathyroid glands resected in 2015, with resolution of her hypercalcemia  She will in the ED with paresthesia and pain in the right arm on 01/08/2019.  At that time, calcium was slightly low, at 8.7 (8.9-10.3).  It normalized afterwards.  I previously recommended Tums x 1 tab with dinner every night.  She did not start this.  Calcium was normal at last check: Lab Results  Component Value Date   CALCIUM 9.0 11/27/2021  CALCIUM 9.0 07/20/2021   CALCIUM 9.2 04/14/2021   CALCIUM 9.4 06/20/2020   CALCIUM 9.2 06/11/2020   CALCIUM 9.0 10/17/2019   CALCIUM 8.9 01/16/2019   CALCIUM 8.7 (L) 01/08/2019   CALCIUM 10.3 07/25/2018   CALCIUM 9.2 07/20/2017   Vitamin D deficiency:  Reviewed vitamin D levels: Lab Results  Component Value Date   VD25OH 42.17 04/14/2021   VD25OH 40 10/17/2019   VD25OH 31.28 01/16/2019   VD25OH 35.44 02/16/2018   VD25OH 14.17 (L) 08/12/2017  She is on 5000 is vitamin D daily.  Reviewed vitamin B-12 levels: Lab Results  Component Value Date   VITAMINB12 595 07/20/2021   VITAMINB12 507 04/14/2021   VITAMINB12 560 01/16/2020   VITAMINB12 616 07/18/2019   VITAMINB12 >1500  (H) 01/16/2019   VITAMINB12 >1500 (H) 07/20/2017   VITAMINB12 623 07/21/2016   VITAMINB12 394 07/09/2015  In the past, on B12 2000 mcg daily but on this dose, her level returned high.  We decreased the dose to 1000 mcg daily but she stopped it.  We continued off the supplement since her B12 level is normal.  Previously on gabapentin, now off.  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 Otherwise, unchanged from my initial visit note.    Past Medical History:  Diagnosis Date   Asthma    Chiari malformation type I ([place])    Diabetes mellitus    GERD (gastroesophageal reflux disease)    Hx of gout    Hyperlipidemia    Hyperparathyroidism ([place])    Hypertension    Lumbar radiculopathy    Postablative hypothyroidism    Sickle cell anemia ([place])    Spondylolysis    Thyroid disease    Past Surgical History:  Procedure Laterality Date   ABDOMINAL HYSTERECTOMY  1994   NO oophorectomy   BREAST BIOPSY Left 2015   [facility]    CATARACT EXTRACTION Right 08/2019   CERVICAL LAMINECTOMY     at C1 w/ duraplasty   CRANIECTOMY SUBOCCIPITAL W/ CERVICAL LAMINECTOMY / CHIARI  07/05/2011   At C1, performed at [facility]  2015   [facility]    Social History   Social History   Marital Status: Married    Spouse Name: N/A   Number of Children: 1   Occupational History   Pharmacist, hospital - high school     Social History Main Topics   Smoking status: Never Smoker    Smokeless tobacco: Not on file   Alcohol Use: No   Drug  Use: No   Social History Narrative   Original from [place]   Married x 44 years   Current Outpatient Medications on File Prior to Visit  Medication Sig Dispense Refill   acetaminophen (TYLENOL) 500 MG tablet Take 1,000 mg by mouth every 6 (six) hours as needed.     albuterol (VENTOLIN HFA) 108 (90 Base) MCG/ACT inhaler Inhale 2 puffs into the lungs every 6 (six) hours as  needed for wheezing or shortness of breath. 18 g 1   benzonatate (TESSALON) 100 MG capsule Take 1 capsule (100 mg total) by mouth 3 (three) times daily as needed for cough. (Patient not taking: Reported on 02/01/2022) 30 capsule 0   BioGaia Probiotic (BIOGAIA/GERBER SOOTHE) LIQD Take 8 drops by mouth in the morning, at noon, and at bedtime. Mix in 8 oz of h20     Biotin 10000 MCG TABS Take by mouth.     Blood Glucose Monitoring Suppl (ONETOUCH VERIO) w/Device KIT Use 1-4 times daily as needed/directed DX E11.9 1 kit 0   celecoxib (CELEBREX) 100 MG capsule Take 1 capsule (100 mg total) by mouth 2 (two) times daily as needed for moderate pain. (Patient not taking: Reported on 02/01/2022) 30 capsule 0   Cholecalciferol (VITAMIN D3) 1.25 MG (50000 UT) TABS Take by mouth.     Cobalamin Combinations (0000000 + FOLIC ACID PO) Take by mouth.     ELIDEL 1 % cream Apply topically 2 (two) times daily. 30 g 1   fexofenadine (ALLEGRA) 180 MG tablet Take 180 mg by mouth daily.     fluticasone (FLONASE) 50 MCG/ACT nasal spray Place 2 sprays into both nostrils daily. 16 g 1   glucose blood test strip Reported on 07/09/2015     hydrOXYzine (ATARAX) 10 MG tablet Take 1-2 tablets by mouth at bedtime as needed for itching/insomnia (Patient not taking: Reported on 02/01/2022) 20 tablet 0   Lancets (ONETOUCH DELICA PLUS 123XX123) MISC USE TO CHECK BLOOD SUGAR 1 TO 4 TIMES A DAY AS NEEDED AS DIRECTED 400 each 1   loperamide HCl (IMODIUM) 1 MG/7.5ML suspension Take 2 mg by mouth 3 (three) times daily as needed for diarrhea or loose stools.     metFORMIN  (GLUCOPHAGE-XR) 500 MG 24 hr tablet TAKE 1 TABLET BY MOUTH THREE TIMES DAILY WITH MEALS 270 tablet 3   ONETOUCH VERIO test strip USE 1 TO 4 TIMES DAILY AS DIRECTED/NEEDED. 200 each 12   pimecrolimus (ELIDEL) 1 % cream Apply 1 Application topically 2 (two) times daily. Ok to use on the face. 30 g 2   Potassium 99 MG TABS Take by mouth.     No current facility-administered medications on file prior to visit.   Allergies  Allergen Reactions   Pollen Extract Itching   Citrus Itching, Rash and Swelling   Ibuprofen Swelling and Rash    Had a reaction to ibuprofen x1 but reports she is able to take aspirin   Other Itching, Rash and Swelling    seafood. seafood   Family History  Problem Relation Age of Onset   Hypertension Sister        sister, daughter , mother    Hyperlipidemia Sister    Thyroid disease Sister    Diabetes Daughter    Stroke Mother    Colon cancer Neg Hx    Sudden death Neg Hx    Heart attack Neg Hx    Breast cancer Neg Hx    Stomach cancer Neg Hx    PE: There were no vitals taken for this visit. Wt Readings from Last 3 Encounters:  02/25/22 163 lb 8 oz (74.2 kg)  02/01/22 164 lb (74.4 kg)  12/30/21 160 lb 12.8 oz (72.9 kg)   Constitutional: normal weight, in NAD Eyes: no exophthalmos ENT: no masses palpated in neck, no cervical lymphadenopathy Cardiovascular: RRR, No MRG Respiratory: CTA B Musculoskeletal: no deformities Skin: no rashes Neurological: no tremor with outstretched hands  Diabetic Foot Exam - Simple   Simple Foot Form Diabetic Foot exam was performed with the following findings: Yes 04/07/2022  9:54 AM  Visual Inspection No deformities, no ulcerations, no other skin breakdown bilaterally: Yes Sensation Testing Intact to touch and monofilament testing bilaterally: Yes Pulse Check Posterior Tibialis and Dorsalis pulse intact bilaterally: Yes Comments    ASSESSMENT: 1. DM2, non-insulin-dependent, now more controlled, without  complications  2. History of hyperparathyroidism -  status post 2 gland parathyroidectomy in 2015   3. Vit D def  4.  History of elevated B12 vitamin  5.  History of elevated TSH  PLAN:  1. Patient with controlled type 2 diabetes, on metformin ER only.  At last visit, HbA1c was lower, at 6.4%.  Sugars were all at goal despite the fact that she stopped Rybelsus after developing diarrhea.  She also had low blood sugars on it.  She is seeing Dr. Tillman Sers with GI for the diarrhea, exacerbated after recent antibiotics. -At today's visit, sugars have improved and they are almost all at goal.  We can continue the same dose of metformin for now. - I suggested to:  Patient Instructions  Please continue: - Metformin ER 500 mg 3x a day  Also, continue: - vitamin D 5000 units daily  Please return in 4 months with your sugar log.   - we checked her HbA1c: 6.3% (better) - advised to check sugars at different times of the day - 1x a day, rotating check times - advised for yearly eye exams >> she is UTD - I previously recommended to restart a statin, after discussion about risks and benefits: Pravastatin 20 mg daily.  She did not start it as she had 2 friends that did not do good on statins.  We discussed that this is a very biased point of view, but for now, we will continue without it. - return to clinic in 4 months  2. History of primary hyperparathyroidism -Status post parathyroidectomy -In 2020, she was in the emergency room with paresthesia and pain in the right arm and was found to have a minimally low calcium, at 8.7.  After the calcium improved and PTH and vitamin D levels were normal -I previously recommended to take 1 Tums tablet with dinner but she did not start this -Latest calcium level was normal: Lab Results  Component Value Date   CALCIUM 9.0 11/27/2021   PHOS 4.4 01/16/2019   3. Vit D def -She continues on 5000 units vitamin D daily -Vitamin D level was normal at last  check: Lab Results  Component Value Date   VD25OH 42.17 04/14/2021   VD25OH 40 10/17/2019   VD25OH 31.28 01/16/2019  -Will repeat this today  4.  Elevated B12 vitamin -She has a history of elevated B12 vitamin, but latest level was normal Lab Results  Component Value Date   VITAMINB12 595 07/20/2021   VITAMINB12 507 04/14/2021   VITAMINB12 560 01/16/2020  -Will repeat this today  5. Elevated TSH -She has a history of TSH fluctuating around upper limit of the target range with normal free thyroid hormones -we discussed about possibly starting a low-dose levothyroxine, but we ended up not starting it -At last visit, her TSH was normal, at 2.17 (06/2021), but since then she had another TSH that was elevated, at 15.36 on 03/18/2022.  This was checked around the time of her COVID-19 episode. -We will repeat her TSH today we discussed that if this is still elevated, she will need  to start levothyroxine.    Component     Latest Ref Rng 04/07/2022  Vitamin B12     211 - 911 pg/mL 1,240 (H)   VITD     30.00 - 100.00 ng/mL 47.73   Hemoglobin A1C     4.0 - 5.6 % 6.3 !   TSH     0.35 - 5.50 uIU/mL 7.22 (H)   T4,Free(Direct)     0.60 - 1.60 ng/dL 0.69   Triiodothyronine,Free,Serum     2.3 - 4.2 pg/mL 3.1     Vitamin B12 is slightly elevated.  I will check with her if she started a supplement.  If so, we will need to reduce the dose. Vitamin D level is excellent. TSH is much better, only slightly above the target range, with normal free T4 and free T3.  No intervention is needed for now but I would like to recheck her TFTs in 2 months.  Philemon Kingdom, MD PhD [facility]

## 2022-04-30 ENCOUNTER — Telehealth: Payer: Self-pay

## 2022-04-30 ENCOUNTER — Encounter: Payer: Self-pay | Admitting: Internal Medicine

## 2022-04-30 NOTE — Telephone Encounter (Signed)
Received [facility] report from 03/25/22- they report PAD in R leg- per Dr. Drue Novel- ask Pt if she is having pain in R leg- if so order ABIs and schedule OV for when he returns from vacation.   LMOM informing Pt of above- asked that she return my call and let me know if she is experiencing pain in R leg.

## 2022-05-04 DIAGNOSIS — K648 Other hemorrhoids: Secondary | ICD-10-CM | POA: Diagnosis not present

## 2022-05-04 DIAGNOSIS — R197 Diarrhea, unspecified: Secondary | ICD-10-CM | POA: Diagnosis not present

## 2022-05-04 DIAGNOSIS — K529 Noninfective gastroenteritis and colitis, unspecified: Secondary | ICD-10-CM | POA: Diagnosis not present

## 2022-05-04 DIAGNOSIS — Z1211 Encounter for screening for malignant neoplasm of colon: Secondary | ICD-10-CM | POA: Diagnosis not present

## 2022-05-07 ENCOUNTER — Telehealth: Payer: Self-pay | Admitting: Internal Medicine

## 2022-05-07 NOTE — Telephone Encounter (Signed)
FYI- pt said she found a vascular doctor and her appt is 06/14/22

## 2022-05-07 NOTE — Telephone Encounter (Signed)
Noted  

## 2022-05-07 NOTE — Telephone Encounter (Signed)
Eugenia Mcalpine routed conversation to [employer] now (9:08 AM)   Doroteo Bradford now (9:08 AM)   SP FYI- pt said she found a vascular doctor and her appt is 06/14/22

## 2022-05-21 ENCOUNTER — Other Ambulatory Visit (HOSPITAL_BASED_OUTPATIENT_CLINIC_OR_DEPARTMENT_OTHER): Payer: Self-pay

## 2022-05-21 ENCOUNTER — Other Ambulatory Visit: Payer: Self-pay

## 2022-05-26 ENCOUNTER — Encounter: Payer: Self-pay | Admitting: Internal Medicine

## 2022-05-26 ENCOUNTER — Ambulatory Visit (INDEPENDENT_AMBULATORY_CARE_PROVIDER_SITE_OTHER): Payer: Medicare Other | Admitting: Internal Medicine

## 2022-05-26 VITALS — BP 132/80 | HR 74 | Temp 98.1°F | Resp 18 | Ht 67.0 in | Wt 157.2 lb

## 2022-05-26 DIAGNOSIS — E1142 Type 2 diabetes mellitus with diabetic polyneuropathy: Secondary | ICD-10-CM | POA: Diagnosis not present

## 2022-05-26 DIAGNOSIS — D571 Sickle-cell disease without crisis: Secondary | ICD-10-CM | POA: Diagnosis not present

## 2022-05-26 DIAGNOSIS — K529 Noninfective gastroenteritis and colitis, unspecified: Secondary | ICD-10-CM
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
## 2022-05-26 NOTE — Patient Instructions (Addendum)
Vaccines I recommend: Covid booster RSV vaccine      GO TO THE LAB : Get the blood work     GO TO THE FRONT DESK, PLEASE SCHEDULE YOUR APPOINTMENTS Come back for a physical by 11-2022

## 2022-05-27 DIAGNOSIS — E1142 Type 2 diabetes mellitus with diabetic polyneuropathy: Secondary | ICD-10-CM | POA: Insufficient documentation

## 2022-05-27 NOTE — Assessment & Plan Note (Signed)
Labs 02-2022 reviewed: Hemoglobin 11.9, TSH 15.3, CMP essentially normal next Repeated TSH 7.2, per endocrinology Chronic diarrhea: Was seen by GI due to chronic diarrhea.  C diff neg, enterotoxin E. coli detected on 03/25/2022 Had a EGD and colonoscopy 05/04/2022. Results essentially normal.  Biopsies negative. Reportedly she is better after a round of antibiotics Paresthesias: As described above, most likely from DM Neuropathy. normal vitamin levels per chart review, no evidence of vascular disease.  Her insurance referred to a vascular surgeon. If symptoms increase or severe medication could be needed. DM neuropathy: new, see above Sickle cell: History of sickle cell diagnosed as a child, has not been tested in the [place] to my knowledge.  Will get her tested  RTC 11-2022 CPX

## 2022-05-28 ENCOUNTER — Ambulatory Visit: Payer: Medicare Other | Admitting: Internal Medicine

## 2022-05-31 LAB — HGB FRACTIONATION CASCADE
Hgb A2: 3.3 % — ABNORMAL HIGH (ref 1.8–3.2)
Hgb A: 56.4 % — ABNORMAL LOW (ref 96.4–98.8)
Hgb F: 0 % (ref 0.0–2.0)
Hgb S: 40.3 % — ABNORMAL HIGH

## 2022-05-31 LAB — HGB SOLUBILITY: Hgb Solubility: POSITIVE — AB

## 2022-06-14 DIAGNOSIS — M5416 Radiculopathy, lumbar region: Secondary | ICD-10-CM | POA: Diagnosis not present

## 2022-07-15 ENCOUNTER — Encounter: Payer: Self-pay | Admitting: Internal Medicine

## 2022-07-15 ENCOUNTER — Ambulatory Visit (INDEPENDENT_AMBULATORY_CARE_PROVIDER_SITE_OTHER): Payer: Medicare Other | Admitting: Internal Medicine

## 2022-07-15 VITALS — BP 126/76 | HR 55 | Temp 97.9°F | Resp 16 | Ht 67.0 in | Wt 158.4 lb

## 2022-07-15 DIAGNOSIS — G4710 Hypersomnia, unspecified: Secondary | ICD-10-CM

## 2022-07-15 DIAGNOSIS — E038 Other specified hypothyroidism: Secondary | ICD-10-CM

## 2022-07-15 DIAGNOSIS — F439 Reaction to severe stress, unspecified: Secondary | ICD-10-CM

## 2022-07-15 LAB — CBC WITH DIFFERENTIAL/PLATELET
Basophils Absolute: 0 10*3/uL (ref 0.0–0.1)
Basophils Relative: 0.6 % (ref 0.0–3.0)
Eosinophils Absolute: 0.1 10*3/uL (ref 0.0–0.7)
Eosinophils Relative: 3.4 % (ref 0.0–5.0)
HCT: 38.1 % (ref 36.0–46.0)
Hemoglobin: 12.3 g/dL (ref 12.0–15.0)
Lymphocytes Relative: 41.6 % (ref 12.0–46.0)
Lymphs Abs: 1.6 10*3/uL (ref 0.7–4.0)
MCHC: 32.3 g/dL (ref 30.0–36.0)
MCV: 90 fl (ref 78.0–100.0)
Monocytes Absolute: 0.3 10*3/uL (ref 0.1–1.0)
Monocytes Relative: 8.3 % (ref 3.0–12.0)
Neutro Abs: 1.8 10*3/uL (ref 1.4–7.7)
Neutrophils Relative %: 46.1 % (ref 43.0–77.0)
Platelets: 269 10*3/uL (ref 150.0–400.0)
RBC: 4.23 Mil/uL (ref 3.87–5.11)
RDW: 14 % (ref 11.5–15.5)
WBC: 3.9 10*3/uL — ABNORMAL LOW (ref 4.0–10.5)

## 2022-07-15 LAB — T3, FREE: T3, Free: 4.7 pg/mL — ABNORMAL HIGH (ref 2.3–4.2)

## 2022-07-15 LAB — BASIC METABOLIC PANEL
BUN: 19 mg/dL (ref 6–23)
CO2: 28 mEq/L (ref 19–32)
Calcium: 9.1 mg/dL (ref 8.4–10.5)
Chloride: 101 mEq/L (ref 96–112)
Creatinine, Ser: 0.9 mg/dL (ref 0.40–1.20)
GFR: 64.81 mL/min (ref >60.00)
Glucose, Bld: 102 mg/dL — ABNORMAL HIGH (ref 70–99)
Potassium: 4.6 mEq/L (ref 3.5–5.1)
Sodium: 140 mEq/L (ref 135–145)

## 2022-07-15 LAB — T4, FREE: Free T4: 0.94 ng/dL (ref 0.60–1.60)

## 2022-07-15 LAB — TSH: TSH: 11.83 u[IU]/mL — ABNORMAL HIGH (ref 0.35–5.50)

## 2022-07-15 NOTE — Assessment & Plan Note (Signed)
Fatigue, hypersomnolence: The patient reports 2 weeks history of fatigue and  "sleeping too many hours", on chart review, this has been a chronic concern, see my note from 05/27/2017, at the time in addition to fatigue she had arthralgias.  A sed rate, ANA, rheumatoid factor and hand x-rays were negative. She also have aches and pains, back in 3/ 2021  and   I suspected fibromyalgia. Available labs reviewed: Renal function always normal, Vitamins checked multiple times, all normal. No history of anemia TSH borderline elevated (7.27 March 2022), consistent with subclinical hypothyroidism. Epworth scale today 7.  This does not match to the report or she sleeps "all day long". Plan: - BMP, CBC, TFTs -Consider treatment for subclinical hypothyroidism as fatigue could be a sign of thyroid disease. -Refer to neurology: Hypersomnolence evaluation. Fibromyalgia, chronic fatigue syndrome?  See above. Stress: She strongly denies being depressed but  is stressed about taking her demented husband to [place], advise that there is a high chance that he will be very confused while leaving his routine surroundings, advised to rethink the trip. RTC 3 months

## 2022-07-15 NOTE — Patient Instructions (Addendum)
Vaccines I recommend: Covid booster RSV vaccine Flu shot this fall  Proceed with blood work   GO TO THE FRONT DESK, PLEASE SCHEDULE YOUR APPOINTMENTS Come back for In 3 months
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
## 2022-07-19 ENCOUNTER — Other Ambulatory Visit: Payer: Self-pay

## 2022-07-19 DIAGNOSIS — E038 Other specified hypothyroidism: Secondary | ICD-10-CM

## 2022-07-28 ENCOUNTER — Other Ambulatory Visit (HOSPITAL_BASED_OUTPATIENT_CLINIC_OR_DEPARTMENT_OTHER): Payer: Self-pay

## 2022-07-28 MED ORDER — COVID-19 MRNA VAC-TRIS(PFIZER) 30 MCG/0.3ML IM SUSY
0.3000 mL | PREFILLED_SYRINGE | Freq: Once | INTRAMUSCULAR | 0 refills | Status: AC
  Filled 2022-07-28: qty 0.3, 1d supply, fill #0

## 2022-07-30 ENCOUNTER — Other Ambulatory Visit (INDEPENDENT_AMBULATORY_CARE_PROVIDER_SITE_OTHER): Payer: Medicare Other

## 2022-07-30 DIAGNOSIS — E038 Other specified hypothyroidism: Secondary | ICD-10-CM | POA: Diagnosis not present

## 2022-07-30 LAB — T4, FREE: Free T4: 0.65 ng/dL (ref 0.60–1.60)

## 2022-07-30 LAB — TSH: TSH: 10.88 u[IU]/mL — ABNORMAL HIGH (ref 0.35–5.50)

## 2022-07-30 LAB — T3, FREE: T3, Free: 2.9 pg/mL (ref 2.3–4.2)

## 2022-08-02 ENCOUNTER — Other Ambulatory Visit: Payer: Self-pay | Admitting: *Deleted

## 2022-08-02 ENCOUNTER — Telehealth: Payer: Self-pay | Admitting: Internal Medicine

## 2022-08-02 ENCOUNTER — Other Ambulatory Visit (HOSPITAL_BASED_OUTPATIENT_CLINIC_OR_DEPARTMENT_OTHER): Payer: Self-pay

## 2022-08-02 DIAGNOSIS — E039 Hypothyroidism, unspecified: Secondary | ICD-10-CM

## 2022-08-02 MED ORDER — LEVOTHYROXINE SODIUM 50 MCG PO TABS
50.0000 ug | ORAL_TABLET | Freq: Every day | ORAL | 1 refills | Status: DC
Start: 2022-08-02 — End: 2022-10-06
  Filled 2022-08-02: qty 30, 30d supply, fill #0
  Filled 2022-08-27: qty 30, 30d supply, fill #1

## 2022-08-02 NOTE — Telephone Encounter (Signed)
Patient notified of results and notes.  She will start the levothyroxine.  Lab only appointment made and future order placed.

## 2022-08-02 NOTE — Telephone Encounter (Signed)
Blood work reviewed, TSH elevated, free T3 previously elevated now WNL which is consistent with previous assay interference.  Clinical picture consistent with subclinical hypothyroidism.  Recommend treatment:  Levothyroxine 50 mcg daily, to be taken early in the morning on an empty stomach , send a prescription.   TSH in 6 weeks. Continue avoiding Biotene, collagen.  Okay to take OTC vitamins but away from levothyroxine.

## 2022-08-18 ENCOUNTER — Encounter (INDEPENDENT_AMBULATORY_CARE_PROVIDER_SITE_OTHER): Payer: Medicare Other | Admitting: Ophthalmology

## 2022-08-18 DIAGNOSIS — Z7984 Long term (current) use of oral hypoglycemic drugs: Secondary | ICD-10-CM | POA: Diagnosis not present

## 2022-08-18 DIAGNOSIS — E113393 Type 2 diabetes mellitus with moderate nonproliferative diabetic retinopathy without macular edema, bilateral: Secondary | ICD-10-CM | POA: Diagnosis not present

## 2022-08-18 DIAGNOSIS — H43813 Vitreous degeneration, bilateral: Secondary | ICD-10-CM

## 2022-08-18 DIAGNOSIS — H35371 Puckering of macula, right eye: Secondary | ICD-10-CM | POA: Diagnosis not present

## 2022-08-18 DIAGNOSIS — H33302 Unspecified retinal break, left eye: Secondary | ICD-10-CM

## 2022-08-18 LAB — HM DIABETES EYE EXAM

## 2022-08-24 ENCOUNTER — Other Ambulatory Visit: Payer: Self-pay | Admitting: Internal Medicine

## 2022-08-24 ENCOUNTER — Other Ambulatory Visit (HOSPITAL_BASED_OUTPATIENT_CLINIC_OR_DEPARTMENT_OTHER): Payer: Self-pay

## 2022-08-24 MED ORDER — METFORMIN HCL ER 500 MG PO TB24
500.0000 mg | ORAL_TABLET | Freq: Three times a day (TID) | ORAL | 11 refills | Status: DC
  Filled 2022-08-24: qty 90, 30d supply, fill #0
  Filled 2022-10-07: qty 90, 30d supply, fill #1

## 2022-08-26 ENCOUNTER — Other Ambulatory Visit (HOSPITAL_BASED_OUTPATIENT_CLINIC_OR_DEPARTMENT_OTHER): Payer: Self-pay

## 2022-09-15 ENCOUNTER — Encounter: Payer: Self-pay | Admitting: Internal Medicine

## 2022-09-22 ENCOUNTER — Ambulatory Visit (INDEPENDENT_AMBULATORY_CARE_PROVIDER_SITE_OTHER): Payer: Medicare Other | Admitting: Internal Medicine

## 2022-09-22 ENCOUNTER — Encounter: Payer: Self-pay | Admitting: Internal Medicine

## 2022-09-22 VITALS — BP 136/74 | HR 71 | Temp 97.8°F | Resp 16 | Ht 67.0 in | Wt 163.4 lb

## 2022-09-22 DIAGNOSIS — E039 Hypothyroidism, unspecified: Secondary | ICD-10-CM | POA: Diagnosis not present

## 2022-09-22 DIAGNOSIS — E1142 Type 2 diabetes mellitus with diabetic polyneuropathy: Secondary | ICD-10-CM | POA: Diagnosis not present

## 2022-09-22 LAB — ALT: ALT: 13 U/L (ref 0–35)

## 2022-09-22 LAB — AST: AST: 19 U/L (ref 0–37)

## 2022-09-22 NOTE — Progress Notes (Signed)
Subjective:    Patient ID: Abigail [name], female    DOB: 09-20-1950, 72 y.o.   MRN: [id number]  DOS:  09/22/2022 Type of visit - description: Follow-up  Follow-up from previous visit. Since then, was Dx with hypothyroidism, started levothyroxine. She feels about the same. Again reports lactose intolerance symptoms.  Wt Readings from Last 3 Encounters:  09/22/22 163 lb 6 oz (74.1 kg)  07/15/22 158 lb 6 oz (71.8 kg)  05/26/22 157 lb 4 oz (71.3 kg)    Review of Systems See above   Past Medical History:  Diagnosis Date   Asthma    Chiari malformation type I (HCC)    Diabetes mellitus    GERD (gastroesophageal reflux disease)    Hx of gout    Hyperlipidemia    Hyperparathyroidism (HCC)    Hypertension    Lumbar radiculopathy    Postablative hypothyroidism    Sickle cell anemia (HCC)    Spondylolysis    Thyroid disease     Past Surgical History:  Procedure Laterality Date   ABDOMINAL HYSTERECTOMY  1994   NO oophorectomy   BREAST BIOPSY Left 2015   [facility]    CATARACT EXTRACTION Right 08/2019   CERVICAL LAMINECTOMY     at C1 w/ duraplasty   CRANIECTOMY SUBOCCIPITAL W/ CERVICAL LAMINECTOMY / CHIARI  07/05/2011   At C1, performed at [facility]   PARATHYROIDECTOMY  2015   [facility]     Current Outpatient Medications  Medication Instructions   acetaminophen (TYLENOL) 1,000 mg, Oral, Every 6 hours PRN   albuterol (VENTOLIN HFA) 108 (90 Base) MCG/ACT inhaler 2 puffs, Inhalation, Every 6 hours PRN   benzonatate (TESSALON) 100 mg, Oral, 3 times daily PRN   BioGaia Probiotic (BIOGAIA/GERBER SOOTHE) LIQD 8 drops, 3 times daily   Biotin 42595 MCG TABS Take by mouth.   Blood Glucose Monitoring Suppl (ONETOUCH VERIO) w/Device KIT Use 1-4 times daily as needed/directed DX E11.9   Cholecalciferol (VITAMIN D3) 1.25 MG (50000 UT) TABS Take by mouth.   Cobalamin Combinations (B-12 + FOLIC ACID PO) Oral   fexofenadine (ALLEGRA) 180 mg, Oral, Daily   fluticasone (FLONASE) 50  MCG/ACT nasal spray 2 sprays, Each Nare, Daily   glucose blood test strip Reported on 07/09/2015   hydrOXYzine (ATARAX) 10 MG tablet Take 1-2 tablets by mouth at bedtime as needed for itching/insomnia   Lancets (ONETOUCH DELICA PLUS LANCET33G) MISC USE TO CHECK BLOOD SUGAR 1 TO 4 TIMES A DAY AS NEEDED AS DIRECTED   levothyroxine (SYNTHROID) 50 mcg, Oral, Daily   loperamide HCl (IMODIUM) 2 mg, Oral, 3 times daily PRN   metFORMIN (GLUCOPHAGE-XR) 500 mg, Oral, 3 times daily with meals   ONETOUCH VERIO test strip USE 1 TO 4 TIMES DAILY AS DIRECTED/NEEDED.   pimecrolimus (ELIDEL) 1 % cream 1 Application, Topical, 2 times daily, Ok to use on the face.   Potassium 99 MG TABS Oral       Objective:   Physical Exam BP 136/74   Pulse 71   Temp 97.8 F (36.6 C) (Oral)   Resp 16   Ht 5\' 7"  (1.702 m)   Wt 163 lb 6 oz (74.1 kg)   SpO2 97%   BMI 25.59 kg/m  General:   Well developed, NAD, BMI noted. HEENT:  Normocephalic . Face symmetric, atraumatic Lungs:  CTA B Normal respiratory effort, no intercostal retractions, no accessory muscle use. Heart: RRR,  no murmur.  Lower extremities: no pretibial edema bilaterally  Skin: Not pale.  Not jaundice Neurologic:  alert & oriented X3.  Speech normal, gait appropriate for age and unassisted Psych--  Cognition and judgment appear intact.  Cooperative with normal attention span and concentration.  Behavior appropriate. No anxious or depressed appearing.      Assessment     Assessment Jehovah witness, no transfusions DM  , onset ~ 2010.  Sees endocrinology Hyperlipidemia  Sickle cell anemia Thyroid dz s/p ablation 2009, on no meds  Hyperparathyroidism -- s/p surgery, [facility], 04-2013 [facility]:  -Lumbar radiculopathy. -H/o Chiari malformation type I,  s/p surgery 2013 @ [facility] -Fibromyalgia?:  See my note 04/25/2019   Fatty liver per ultrasound 11-2015 Renal cyst vs solid mass per u/s 12/2015, MRI 02-2016: Bosniak category 1. Not  suspicious. Allergies - alb prn, Dr gallagher Vit D  Def Lactose intolerance as reported by the patient   PLAN: DM: Per Endo, check AST ALT Hyperlipidemia: Diet controlled, again she is reluctant to take statins Hypothyroidism: Since LOV, TSH was 10.8, she qualified for HRT, started levothyroxine, check a TSH today.   Fatigue, hypersomnolence: See LOV for details, symptoms are about the same.  PHQ-9 today: 2, negative. Weight gain: Noted some weight gain, reports she has been on vacation and eating more than usual.  Plans to go back to a more healthy diet.  Is trying to exercise more. Vitamin D deficiency: Not taking supplements, unclear why, recommend to restart. Vaccine advice provided RTC 3 to 4 months CPX

## 2022-09-22 NOTE — Patient Instructions (Addendum)
Vaccines I recommend: Covid booster-new this fall Flu shot this fall Prevnar 20 RSV vaccine     GO TO THE LAB : Get the blood work     GO TO THE FRONT DESK, PLEASE SCHEDULE YOUR APPOINTMENTS Come back for   a physical exam in 3 to 4 months   Per our records you are due for your diabetic eye exam. Please contact your eye doctor to schedule an appointment. Please have them send copies of your office visit notes to [place]. Our fax number is [phone]. If you need a referral to an eye doctor please let us know.

## 2022-09-22 NOTE — Assessment & Plan Note (Signed)
DM: Per Endo, check AST ALT Hyperlipidemia: Diet controlled, again she is reluctant to take statins Hypothyroidism: Since LOV, TSH was 10.8, she qualified for HRT, started levothyroxine, check a TSH today.   Fatigue, hypersomnolence: See LOV for details, symptoms are about the same.  PHQ-9 today: 2, negative. Weight gain: Noted some weight gain, reports she has been on vacation and eating more than usual.  Plans to go back to a more healthy diet.  Is trying to exercise more. Vitamin D deficiency: Not taking supplements, unclear why, recommend to restart. Vaccine advice provided RTC 3 to 4 months CPX

## 2022-09-23 LAB — TSH: TSH: 2.22 u[IU]/mL (ref 0.35–5.50)

## 2022-10-06 ENCOUNTER — Other Ambulatory Visit (HOSPITAL_BASED_OUTPATIENT_CLINIC_OR_DEPARTMENT_OTHER): Payer: Self-pay

## 2022-10-06 ENCOUNTER — Other Ambulatory Visit: Payer: Self-pay | Admitting: Internal Medicine

## 2022-10-06 DIAGNOSIS — E039 Hypothyroidism, unspecified: Secondary | ICD-10-CM

## 2022-10-06 MED ORDER — LEVOTHYROXINE SODIUM 50 MCG PO TABS
50.0000 ug | ORAL_TABLET | Freq: Every day | ORAL | 1 refills | Status: DC
Start: 2022-10-06 — End: 2023-05-06
  Filled 2022-10-06: qty 90, 90d supply, fill #0
  Filled 2023-01-21: qty 90, 90d supply, fill #1

## 2022-10-07 ENCOUNTER — Other Ambulatory Visit (HOSPITAL_BASED_OUTPATIENT_CLINIC_OR_DEPARTMENT_OTHER): Payer: Self-pay

## 2022-10-07 ENCOUNTER — Other Ambulatory Visit: Payer: Self-pay

## 2022-10-07 LAB — MICROALBUMIN / CREATININE URINE RATIO: Microalb Creat Ratio: 29.999

## 2022-10-08 ENCOUNTER — Ambulatory Visit: Payer: Medicare Other | Admitting: Internal Medicine

## 2022-10-14 ENCOUNTER — Encounter: Payer: Self-pay | Admitting: Internal Medicine

## 2022-10-14 ENCOUNTER — Ambulatory Visit (INDEPENDENT_AMBULATORY_CARE_PROVIDER_SITE_OTHER): Payer: Medicare Other | Admitting: Internal Medicine

## 2022-10-14 ENCOUNTER — Other Ambulatory Visit (HOSPITAL_BASED_OUTPATIENT_CLINIC_OR_DEPARTMENT_OTHER): Payer: Self-pay

## 2022-10-14 VITALS — BP 130/70 | HR 68 | Ht 67.0 in | Wt 162.0 lb

## 2022-10-14 DIAGNOSIS — E1165 Type 2 diabetes mellitus with hyperglycemia: Secondary | ICD-10-CM

## 2022-10-14 DIAGNOSIS — E210 Primary hyperparathyroidism: Secondary | ICD-10-CM | POA: Diagnosis not present

## 2022-10-14 DIAGNOSIS — Z7984 Long term (current) use of oral hypoglycemic drugs: Secondary | ICD-10-CM | POA: Diagnosis not present

## 2022-10-14 DIAGNOSIS — R748 Abnormal levels of other serum enzymes: Secondary | ICD-10-CM | POA: Diagnosis not present

## 2022-10-14 DIAGNOSIS — E559 Vitamin D deficiency, unspecified: Secondary | ICD-10-CM | POA: Diagnosis not present

## 2022-10-14 DIAGNOSIS — R7989 Other specified abnormal findings of blood chemistry: Secondary | ICD-10-CM

## 2022-10-14 DIAGNOSIS — E890 Postprocedural hypothyroidism: Secondary | ICD-10-CM | POA: Diagnosis not present

## 2022-10-14 LAB — POCT GLYCOSYLATED HEMOGLOBIN (HGB A1C): Hemoglobin A1C: 6.3 % — AB (ref 4.0–5.6)

## 2022-10-14 MED ORDER — METFORMIN HCL ER 500 MG PO TB24
500.0000 mg | ORAL_TABLET | Freq: Three times a day (TID) | ORAL | 3 refills | Status: DC
  Filled 2022-10-14 – 2022-11-10 (×2): qty 270, 90d supply, fill #0
  Filled 2023-02-04: qty 270, 90d supply, fill #1
  Filled 2023-05-06: qty 270, 90d supply, fill #2
  Filled 2023-08-04: qty 270, 90d supply, fill #3

## 2022-10-14 NOTE — Patient Instructions (Addendum)
Please continue: - Metformin ER 500 mg 3x a day  Also, continue: - vitamin D 5000 units daily  Continue Levothyroxine 50 mcg daily.  Take the thyroid hormone every day, with water, at least 30 minutes before breakfast, separated by at least 4 hours from: - acid reflux medications - calcium - iron - multivitamins  Please come back for labs in 2-3 weeks.  Please return in 6 months with your sugar log.

## 2022-10-14 NOTE — Progress Notes (Signed)
Patient ID: Abigail [name], female   DOB: May 05, 1950, 72 y.o.   MRN: [id number]  HPI: Abigail [name] is a 72 y.o.-year-old female, returning for f/u for DM2, dx in ~2010, non-insulin-dependent, controlled, without complications and h/o primary HPTH.  She is the wife of [name], also my patient. Last visit 6 months ago.  Interim history: No increased urination, blurry vision.  Also, no nausea, chest pain.   She she has diarrhea, initially improved after stopping Rybelsus, but exacerbated again before last visit. She sees GI. She tried to avoid gluten and lactose. She do not feel that this is related to Metformin. Since last OV >> she was started on Levothyroxine by PCP.  She does not feel different on this. She started exercise at [employer] since last visit.  DM2: Reviewed HbA1c levels: Lab Results  Component Value Date   HGBA1C 6.3 (A) 04/07/2022   HGBA1C 7.6 03/25/2022   HGBA1C 6.4 (A) 08/25/2021   Pt is on a regimen of: - Metformin ER 500 mg 2x >> 3x a day She could not tolerate Rybelsus >> diarrhea, lows CBGs. She was previously on Prandin 1 mg before larger meals, but she kept forgetting it.  Pt checks her sugars 1-2x a day per review of her log: - am: 73-133, 147 >> 81-128 >> 95-100, 115 >> 88-113, 125 >> 90-121, 147 - 2h after b'fast: 113, 148 >> 101-136 >> 98-104 >> 107 >> n/c >> 97-133, 174 - before lunch: n/c >> 215, 269 >> 121 >> n/c >> 108, 165 >> 89-153 - 2h after lunch: 177 >> 107-215 >> 191 >> 149-200 >> 83-118 >> 68, 93-134 - before dinner: 101-135 >> 131, 135 >> 79 >> n/c >> 83-108 >> 101-134 - 2h after dinner: 182 >> 169 >> <160 >> 121, 122, 170 >> 80-149 - bedtime: n/c >> 132 >> n/c >> 152 >> n/c - nighttime: n/c Lowest sugar was 58 >> ... 83 >> 68 ; she has hypoglycemia awareness in the 80s. Highest sugar was269 (plantains, forgot meds) ...>> 170 >> 174.  Glucometer: ReliOn  Pt's meals are: - Breakfast: Fruit, eggs - Lunch:  chicken/fish + veggies - no red meat  - Dinner: veggies + fruit +/- chicken/fish - Snacks: cashews, fruit (Mango)  No CKD, last BUN/creatinine:  Lab Results  Component Value Date   BUN 19 07/15/2022   CREATININE 0.90 07/15/2022   Lab Results  Component Value Date   MICRALBCREAT 0.8 11/27/2021   MICRALBCREAT NOTE 09/01/2018   MICRALBCREAT 0.6 05/06/2015   + HL; last set of lipids: Lab Results  Component Value Date   CHOL 163 11/27/2021   HDL 62.50 11/27/2021   LDLCALC 77 11/27/2021   TRIG 118.0 11/27/2021   CHOLHDL 3 11/27/2021  She is not on a statin - declined.  She was previously on pravastatin but reportedly this was stopped due to good control. She has a history of fatty liver per ultrasound from 2017. I again recommended Pravastatin at last OV >> refused.  - last eye exam was on 08/17/2021: No DR-she had cataract surgery.   -+ Numbness and tingling in her feet.  She has a history of surgery on her toe.  Last foot exam  04/07/2022.  H/o primary hyperparathyroidism  - s/p 2x parathyroid glands resected in 2015, with resolution of her hypercalcemia  She will in the ED with paresthesia and pain in the right arm on 01/08/2019.  At that time, calcium was slightly low, at 8.7 (8.9-10.3).  It normalized afterwards.  I previously recommended Tums x 1 tab with dinner every night.  She did not start this.  Calcium was normal at last check: Lab Results  Component Value Date   CALCIUM 9.1 07/15/2022   CALCIUM 9.0 11/27/2021   CALCIUM 9.0 07/20/2021   CALCIUM 9.2 04/14/2021   CALCIUM 9.4 06/20/2020   CALCIUM 9.2 06/11/2020   CALCIUM 9.0 10/17/2019   CALCIUM 8.9 01/16/2019   CALCIUM 8.7 (L) 01/08/2019   CALCIUM 10.3 07/25/2018   Vitamin D deficiency:  Reviewed vitamin D levels: Lab Results  Component Value Date   VD25OH 47.73 04/07/2022   VD25OH 42.17 04/14/2021   VD25OH 40 10/17/2019   VD25OH 31.28 01/16/2019   VD25OH 35.44 02/16/2018   VD25OH 14.17 (L) 08/12/2017   She is on 5000 is vitamin D daily.  Reviewed vitamin B-12 levels: Lab Results  Component Value Date   VITAMINB12 1,240 (H) 04/07/2022   VITAMINB12 595 07/20/2021   VITAMINB12 507 04/14/2021   VITAMINB12 560 01/16/2020   VITAMINB12 616 07/18/2019   VITAMINB12 >1500 (H) 01/16/2019   VITAMINB12 >1500 (H) 07/20/2017   VITAMINB12 623 07/21/2016   VITAMINB12 394 07/09/2015  In the past, on B12 2000 mcg daily but on this dose, her level returned high.  We decreased the dose to 1000 mcg daily but she stopped it.  We continued off the supplement.  Previously on gabapentin, now off.   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  She was started on LT4 50 mcg daily by PCP.  She takes this: - in am - fasting - at least 60 min from b'fast - no calcium - no iron - no multivitamins - no PPIs - not on Biotin  Reviewed her TSH levels: Lab Results  Component Value Date   TSH 2.22 09/22/2022   TSH 10.88 (H) 07/30/2022   TSH 11.83 (H) 07/15/2022   TSH 7.22 (H) 04/07/2022   TSH 2.17 07/20/2021   TSH 5.28 04/14/2021   TSH 3.63 11/07/2020   TSH 5.67 (H) 06/13/2020   TSH 6.03 (H) 06/11/2020   TSH 4.42 12/04/2019  03/18/2022: TSH 15.36  ROS: + see HPI  I reviewed pt's medications, allergies, PMH, social hx, family hx, and changes were documented in the history of present illness. Otherwise, unchanged from my initial visit note.    Past Medical History:  Diagnosis Date   Asthma    Chiari malformation type I (HCC)    Diabetes mellitus    GERD (gastroesophageal reflux disease)    Hx of gout    Hyperlipidemia    Hyperparathyroidism (HCC)    Hypertension    Lumbar radiculopathy    Postablative hypothyroidism    Sickle cell anemia (HCC)    Spondylolysis    Thyroid disease    Past Surgical History:  Procedure Laterality Date   ABDOMINAL HYSTERECTOMY  1994   NO oophorectomy   BREAST BIOPSY  Left 2015   [facility]    CATARACT EXTRACTION Right 08/2019   CERVICAL LAMINECTOMY     at C1 w/ duraplasty   CRANIECTOMY SUBOCCIPITAL W/ CERVICAL LAMINECTOMY / CHIARI  07/05/2011   At C1, performed at [facility]   PARATHYROIDECTOMY  2015   [facility]    Social History   Social History   Marital Status: Married    Spouse Name: N/A   Number of Children: 1   Occupational History   teacher - high  school     Social History Main Topics   Smoking status: Never Smoker    Smokeless tobacco: Not on file   Alcohol Use: No   Drug Use: No   Social History Narrative   Original from [place]   Married x 44 years   Current Outpatient Medications on File Prior to Visit  Medication Sig Dispense Refill   acetaminophen (TYLENOL) 500 MG tablet Take 1,000 mg by mouth every 6 (six) hours as needed.     albuterol (VENTOLIN HFA) 108 (90 Base) MCG/ACT inhaler Inhale 2 puffs into the lungs every 6 (six) hours as needed for wheezing or shortness of breath. (Patient not taking: Reported on 09/22/2022) 18 g 1   Blood Glucose Monitoring Suppl (ONETOUCH VERIO) w/Device KIT Use 1-4 times daily as needed/directed DX E11.9 1 kit 0   Cholecalciferol (VITAMIN D3) 1.25 MG (50000 UT) TABS Take by mouth. (Patient not taking: Reported on 09/22/2022)     Cobalamin Combinations (B-12 + FOLIC ACID PO) Take by mouth.     fexofenadine (ALLEGRA) 180 MG tablet Take 180 mg by mouth daily.     glucose blood test strip Reported on 07/09/2015     Lancets (ONETOUCH DELICA PLUS LANCET33G) MISC USE TO CHECK BLOOD SUGAR 1 TO 4 TIMES A DAY AS NEEDED AS DIRECTED 400 each 1   levothyroxine (SYNTHROID) 50 MCG tablet Take 1 tablet (50 mcg total) by mouth daily before breakfast. 90 tablet 1   loperamide HCl (IMODIUM) 1 MG/7.5ML suspension Take 2 mg by mouth 3 (three) times daily as needed for diarrhea or loose stools.     metFORMIN (GLUCOPHAGE-XR) 500 MG 24 hr tablet Take 1 tablet (500 mg total) by mouth 3 (three) times daily with meals. 90 tablet 11    ONETOUCH VERIO test strip USE 1 TO 4 TIMES DAILY AS DIRECTED/NEEDED. 200 each 12   Potassium 99 MG TABS Take by mouth.     No current facility-administered medications on file prior to visit.   Allergies  Allergen Reactions   Pollen Extract Itching   Citrus Itching, Rash and Swelling   Ibuprofen Swelling and Rash    Had a reaction to ibuprofen x1 but reports she is able to take aspirin   Other Itching, Rash and Swelling    seafood. seafood   Family History  Problem Relation Age of Onset   Hypertension Sister        sister, daughter , mother    Hyperlipidemia Sister    Thyroid disease Sister    Diabetes Daughter    Stroke Mother    Colon cancer Neg Hx    Sudden death Neg Hx    Heart attack Neg Hx    Breast cancer Neg Hx    Stomach cancer Neg Hx    PE: There were no vitals taken for this visit. Wt Readings from Last 3 Encounters:  09/22/22 163 lb 6 oz (74.1 kg)  07/15/22 158 lb 6 oz (71.8 kg)  05/26/22 157 lb 4 oz (71.3 kg)   Constitutional: normal weight, in NAD Eyes: no exophthalmos ENT: no masses palpated in neck, no cervical lymphadenopathy Cardiovascular: RRR, No MRG Respiratory: CTA B Musculoskeletal: no deformities Skin: no rashes Neurological: no tremor with outstretched hands  ASSESSMENT: 1. DM2, non-insulin-dependent, now more controlled, without complications  2. History of hyperparathyroidism -  status post 2 gland parathyroidectomy in 2015   3. Vit D def  4.  History of elevated B12 vitamin  5.  History of elevated  TSH  PLAN:  1. Patient with controlled type 2 diabetes, on metformin ER only.   -She tried Rybelsus in the past but developed diarrhea and also had low blood sugars on it.  At last visit, she still had diarrhea, exacerbated by antibiotics.  Seeing Dr. Adolphus Birchwood.  She does not feel that her diarrhea is related to metformin. -At last visit, sugars were improved and they were almost all at goal.  HbA1c was also improved, at 6.3%.  We did  not change her regimen. -At today's visit, sugars remain well-controlled, except for an occasional slightly elevated blood sugar.  No need to change the regimen for now.  We discussed about continuing to improve diet.  She also started consistent exercise since last visit. - I suggested to:  Patient Instructions  Please continue: - Metformin ER 500 mg 3x a day  Also, continue: - vitamin D 5000 units daily  Continue Levothyroxine 50 mcg daily.  Take the thyroid hormone every day, with water, at least 30 minutes before breakfast, separated by at least 4 hours from: - acid reflux medications - calcium - iron - multivitamins  Please come back for labs in 2-3 weeks.  Please return in 6 months with your sugar log.   - we checked her HbA1c: 6.3% (stable) - advised to check sugars at different times of the day - 1x a day, rotating check times - advised for yearly eye exams >> she is UTD - I previously recommended to restart a statin, after discussion about risks and benefits: Pravastatin 20 mg daily.  She did not start it due to fear of side effects as she had 2 friends that did not feel good on statins... - will check ACR at next lab visit - return to clinic in 4 months  2. History of primary hyperparathyroidism -Status post parathyroidectomy -In 2020, she was in the emergency room with paresthesia and pain in the right arm and was found to have a minimally low calcium, at 8.7.  After the calcium improved and PTH and vitamin D levels were normal -I previously recommended to take 1 Tums tablet with dinner but she did not start this -Latest calcium level was normal: Lab Results  Component Value Date   CALCIUM 9.1 07/15/2022   PHOS 4.4 01/16/2019   3. Vit D def -She continues 5000 units vitamin D daily -came off for period of time but this was restarted by PCP -Vitamin D level was normal at last check: Lab Results  Component Value Date   VD25OH 47.73 04/07/2022   VD25OH 42.17  04/14/2021   VD25OH 40 10/17/2019   4.  Elevated B12 vitamin -At last visit, B12 level was slightly high, so I advised her to stop any B12 supplements Lab Results  Component Value Date   VITAMINB12 1,240 (H) 04/07/2022   VITAMINB12 595 07/20/2021   VITAMINB12 507 04/14/2021  - will repeat the level in 2-3 weeks  5.  Acquired hypothyroidism -She had fluctuating TFTs in the past, with the highest being on 03/08/2022, at 15.3.  This was after COVID episode so we decided to repeat this and see if she absolutely needs levothyroxine.  Her TSH was still high at last check so PCP started her on LT4 50 mcg daily. - latest thyroid labs reviewed with pt. >> normal: Lab Results  Component Value Date   TSH 2.22 09/22/2022  - she continues on LT4 50 mcg daily - we discussed about taking the thyroid hormone every day, with  water, >30 minutes before breakfast, separated by >4 hours from acid reflux medications, calcium, iron, multivitamins. Pt. is taking it correctly. - will check thyroid tests in 2-3 weeks: TSH, free T4  Orders Placed This Encounter  Procedures   TSH   T4, free   Vitamin B12   Microalbumin / creatinine urine ratio   POCT glycosylated hemoglobin (Hb A1C)   Carlus Pavlov, MD PhD [facility] Endocrinology

## 2022-10-19 ENCOUNTER — Encounter: Payer: Self-pay | Admitting: Internal Medicine

## 2022-10-22 ENCOUNTER — Other Ambulatory Visit (HOSPITAL_BASED_OUTPATIENT_CLINIC_OR_DEPARTMENT_OTHER): Payer: Self-pay

## 2022-10-22 MED ORDER — CLINDAMYCIN HCL 300 MG PO CAPS
300.0000 mg | ORAL_CAPSULE | Freq: Two times a day (BID) | ORAL | 0 refills | Status: DC
  Filled 2022-10-22: qty 30, 15d supply, fill #0

## 2022-10-22 MED ORDER — CHLORHEXIDINE GLUCONATE 0.12 % MT SOLN
OROMUCOSAL | 3 refills | Status: DC
  Filled 2022-10-22: qty 473, 14d supply, fill #0

## 2022-10-25 ENCOUNTER — Other Ambulatory Visit (HOSPITAL_BASED_OUTPATIENT_CLINIC_OR_DEPARTMENT_OTHER): Payer: Self-pay

## 2022-10-25 MED ORDER — CLINDAMYCIN HCL 300 MG PO CAPS
300.0000 mg | ORAL_CAPSULE | Freq: Four times a day (QID) | ORAL | 0 refills | Status: DC
  Filled 2022-10-25 – 2022-10-29 (×2): qty 21, 6d supply, fill #0

## 2022-10-28 ENCOUNTER — Ambulatory Visit: Payer: Medicare Other | Admitting: Internal Medicine

## 2022-10-29 ENCOUNTER — Other Ambulatory Visit (HOSPITAL_BASED_OUTPATIENT_CLINIC_OR_DEPARTMENT_OTHER): Payer: Self-pay

## 2022-11-04 ENCOUNTER — Other Ambulatory Visit (INDEPENDENT_AMBULATORY_CARE_PROVIDER_SITE_OTHER): Payer: Medicare Other

## 2022-11-04 DIAGNOSIS — E890 Postprocedural hypothyroidism: Secondary | ICD-10-CM | POA: Diagnosis not present

## 2022-11-04 DIAGNOSIS — R7989 Other specified abnormal findings of blood chemistry: Secondary | ICD-10-CM | POA: Diagnosis not present

## 2022-11-04 DIAGNOSIS — E1165 Type 2 diabetes mellitus with hyperglycemia: Secondary | ICD-10-CM

## 2022-11-04 LAB — VITAMIN B12: Vitamin B-12: 317 pg/mL (ref 211–911)

## 2022-11-04 LAB — MICROALBUMIN / CREATININE URINE RATIO
Creatinine,U: 65.7 mg/dL
Microalb Creat Ratio: 1.1 mg/g (ref 0.0–30.0)
Microalb, Ur: <0.7 mg/dL (ref 0.0–1.9)

## 2022-11-04 LAB — T3, FREE: T3, Free: 3.2 pg/mL (ref 2.3–4.2)

## 2022-11-04 LAB — TSH: TSH: 4.79 u[IU]/mL (ref 0.35–5.50)

## 2022-11-04 LAB — T4, FREE: Free T4: 0.94 ng/dL (ref 0.60–1.60)

## 2022-11-10 ENCOUNTER — Other Ambulatory Visit (HOSPITAL_BASED_OUTPATIENT_CLINIC_OR_DEPARTMENT_OTHER): Payer: Self-pay

## 2022-11-16 ENCOUNTER — Encounter: Payer: Self-pay | Admitting: Internal Medicine

## 2022-11-17 ENCOUNTER — Ambulatory Visit (INDEPENDENT_AMBULATORY_CARE_PROVIDER_SITE_OTHER): Payer: Medicare Other | Admitting: Internal Medicine

## 2022-11-17 ENCOUNTER — Encounter: Payer: Self-pay | Admitting: Internal Medicine

## 2022-11-17 VITALS — BP 140/84 | HR 79 | Temp 98.3°F | Resp 18 | Ht 67.0 in | Wt 164.4 lb

## 2022-11-17 DIAGNOSIS — B349 Viral infection, unspecified: Secondary | ICD-10-CM

## 2022-11-17 LAB — POCT RAPID STREP A (OFFICE): Rapid Strep A Screen: NEGATIVE

## 2022-11-17 NOTE — Assessment & Plan Note (Signed)
Viral syndrome: Symptoms started a week ago, COVID-19 test  was  NEG 4 days ago, strep test negative today. Does not look toxic, vital signs stable Recommend conservative treatment with rest, Tylenol, OTCs.  Call if not gradually better. She verbalized understanding

## 2022-11-17 NOTE — Patient Instructions (Addendum)
  Rest, fluids   Tylenol  500 mg OTC 2 tabs a day every 8 hours as needed for pain  For cough:  Take Mucinex DM or Robitussin-DM OTC.  Follow the instructions in the box.   For nasal congestion: -Use over-the-counter Flonase: 2 nasal sprays on each side of the nose in the morning until you feel better  -Use OTC Astepro 2 nasal sprays on each side of the nose twice daily until better  Avoid decongestants such as  Pseudoephedrine or phenylephrine      Call if not gradually better over the next  10 days   Call anytime if the symptoms are severe, you have high fever, short of breath, chest pain

## 2022-11-17 NOTE — Progress Notes (Signed)
Subjective:    Patient ID: Abigail [name], female    DOB: 1950/12/17, 72 y.o.   MRN: [id number]  DOS:  11/17/2022 Type of visit - description: Acute  Symptoms started a week ago: Sore throat, sinus and chest congestion, subjective fever, mild headache. Has been taking aspirin, ibuprofen, Tylenol with some relief.  No nausea or vomiting.  Had diarrhea 1 time. Admits to some myalgias. Also some cough with occasional sputum production, color?Marland Kitchen No wheezing.   Review of Systems See above   Past Medical History:  Diagnosis Date   Asthma    Chiari malformation type I (HCC)    Diabetes mellitus    GERD (gastroesophageal reflux disease)    Hx of gout    Hyperlipidemia    Hyperparathyroidism (HCC)    Hypertension    Lumbar radiculopathy    Postablative hypothyroidism    Sickle cell anemia (HCC)    Spondylolysis    Thyroid disease     Past Surgical History:  Procedure Laterality Date   ABDOMINAL HYSTERECTOMY  1994   NO oophorectomy   BREAST BIOPSY Left 2015   [facility]    CATARACT EXTRACTION Right 08/2019   CERVICAL LAMINECTOMY     at C1 w/ duraplasty   CRANIECTOMY SUBOCCIPITAL W/ CERVICAL LAMINECTOMY / CHIARI  07/05/2011   At C1, performed [facility]   PARATH[facility]ROIDECTOMY  2015   [facility]     Current Outpatient Medications  Medication Instructions   acetaminophen (TYLENOL) 1,000 mg, Oral, Every 6 hours PRN   albuterol (VENTOLIN HFA) 108 (90 Base) MCG/ACT inhaler 2 puffs, Inhalation, Every 6 hours PRN   Blood Glucose Monitoring Suppl (ONETOUCH VERIO) w/Device KIT Use 1-4 times daily as needed/directed DX E11.9   chlorhexidine (PERIDEX) 0.12 % solution Swish with ONE-HALF ounce for thirty seconds twice daily. Spit out, do not swallow.   Cholecalciferol (VITAMIN D3) 1.25 MG (50000 UT) TABS Oral   clindamycin (CLEOCIN) 300 MG capsule Take 1 capsule (300 mg total) by mouth every 6 (six) hours until gone.   Cobalamin Combinations (B-12 + FOLIC ACID PO) Oral   fexofenadine  (ALLEGRA) 180 mg, Oral, Daily   glucose blood test strip Reported on 07/09/2015   Lancets (ONETOUCH DELICA PLUS LANCET33G) MISC USE TO CHECK BLOOD SUGAR 1 TO 4 TIMES A DAY AS NEEDED AS DIRECTED   levothyroxine (SYNTHROID) 50 mcg, Oral, Daily before breakfast   loperamide HCl (IMODIUM) 2 mg, 3 times daily PRN   metFORMIN (GLUCOPHAGE-XR) 500 mg, Oral, 3 times daily with meals   ONETOUCH VERIO test strip USE 1 TO 4 TIMES DAILY AS DIRECTED/NEEDED.   Potassium 99 MG TABS Oral       Objective:   Physical Exam BP (!) 140/84   Pulse 79   Temp 98.3 F (36.8 C) (Oral)   Resp 18   Ht 5\' 7"  (1.702 m)   Wt 164 lb 6 oz (74.6 kg)   SpO2 96%   BMI 25.74 kg/m  General:   Well developed, NAD, BMI noted. HEENT:  Normocephalic . Face symmetric, atraumatic. TMs: Slightly bulged, not red.  Nose with minimal congestion.  Throat symmetric, no red Lungs:  CTA B Normal respiratory effort, no intercostal retractions, no accessory muscle use. Heart: RRR,  no murmur.  Lower extremities: no pretibial edema bilaterally  Skin: Not pale. Not jaundice Neurologic:  alert & oriented X3.  Speech normal, gait appropriate for age and unassisted Psych--  Cognition and judgment appear intact.  Cooperative with normal attention span and  concentration.  Behavior appropriate. No anxious or depressed appearing.      Assessment     Assessment Jehovah witness, no transfusions DM  , onset ~ 2010.  Sees endocrinology Hyperlipidemia  Sickle cell anemia Thyroid dz s/p ablation 2009, on no meds  Hyperparathyroidism -- s/p surgery, [facility], 04-2013 [facility]:  -Lumbar radiculopathy. -H/o Chiari malformation type I,  s/p surgery 2013 @ [facility] -Fibromyalgia?:  See my note 04/25/2019   Fatty liver per ultrasound 11-2015 Renal cyst vs solid mass per u/s 12/2015, MRI 02-2016: Bosniak category 1. Not suspicious. Allergies - alb prn, Dr gallagher Vit D  Def Lactose intolerance as reported by the patient   PLAN: Viral  syndrome: Symptoms started a week ago, COVID-19 test  was  NEG 4 days ago, strep test negative today. Does not look toxic, vital signs stable Recommend conservative treatment with rest, Tylenol, OTCs.  Call if not gradually better. She verbalized understanding

## 2022-12-01 ENCOUNTER — Other Ambulatory Visit (HOSPITAL_BASED_OUTPATIENT_CLINIC_OR_DEPARTMENT_OTHER): Payer: Self-pay

## 2022-12-01 MED ORDER — AMOXICILLIN-POT CLAVULANATE 875-125 MG PO TABS
1.0000 | ORAL_TABLET | Freq: Two times a day (BID) | ORAL | 0 refills | Status: DC
  Filled 2022-12-01: qty 14, 7d supply, fill #0

## 2022-12-28 ENCOUNTER — Encounter: Payer: Self-pay | Admitting: Internal Medicine

## 2022-12-28 ENCOUNTER — Ambulatory Visit (INDEPENDENT_AMBULATORY_CARE_PROVIDER_SITE_OTHER): Payer: Medicare Other | Admitting: Internal Medicine

## 2022-12-28 VITALS — BP 130/80 | HR 66 | Temp 98.1°F | Resp 16 | Ht 67.0 in | Wt 160.5 lb

## 2022-12-28 DIAGNOSIS — E559 Vitamin D deficiency, unspecified: Secondary | ICD-10-CM | POA: Diagnosis not present

## 2022-12-28 DIAGNOSIS — Z0000 Encounter for general adult medical examination without abnormal findings: Secondary | ICD-10-CM | POA: Diagnosis not present

## 2022-12-28 DIAGNOSIS — Z23 Encounter for immunization: Secondary | ICD-10-CM | POA: Diagnosis not present

## 2022-12-28 DIAGNOSIS — Z0001 Encounter for general adult medical examination with abnormal findings: Secondary | ICD-10-CM

## 2022-12-28 DIAGNOSIS — E785 Hyperlipidemia, unspecified: Secondary | ICD-10-CM | POA: Diagnosis not present

## 2022-12-28 DIAGNOSIS — E039 Hypothyroidism, unspecified: Secondary | ICD-10-CM

## 2022-12-28 DIAGNOSIS — E1142 Type 2 diabetes mellitus with diabetic polyneuropathy: Secondary | ICD-10-CM

## 2022-12-28 MED ORDER — VITAMIN D3 1.25 MG (50000 UT) PO TABS: ORAL_TABLET | ORAL | Status: AC

## 2022-12-28 NOTE — Assessment & Plan Note (Signed)
Here for CPX DM: Per endocrinology.  Able to tolerate only half metformin 3 times daily Hyperlipidemia: Will not take his statins "unless it is necessary".  Advised that every patient with DM needs statins, nevertheless she will be reluctant to take chol meds  but likes to check FLP. Thyroid disease: Per endocrinology Vitamin D deficiency: Taking OTC vitamin D once a week ( dosage?).  Check levels. RTC 6 months

## 2022-12-28 NOTE — Patient Instructions (Addendum)
Vaccines I recommend: Covid booster      GO TO THE LAB : Get the blood work     Next visit with me in 6 months    Please schedule it at the front desk

## 2022-12-28 NOTE — Assessment & Plan Note (Signed)
Here for CPX - Td 04-2015 - pnm 23: 2016;  prevnar: 2019.  PNM 20: 12/2022. - S/p shingrix -  flu shot today -Recommend: COVID booster if not done recently  Female care:   -- Saw gynecology 01-2022 --- [facility] 12/2021 ([facility]) --[facility]: Colonoscopy 07-2012 , Dr Norma Fredrickson colonoscopy 05/04/2022.Results essentially normal.  Biopsies negative.  Next per GI --Bones- T score -0.9 (04-2015), T-score -1.2 (12/2021) Rx calcium vitamin D. --Labs:   See orders -- Lifestyle: Exercises regularly, goes to the gym. -Advance care planning: Documents scanned

## 2022-12-28 NOTE — Progress Notes (Signed)
Subjective:    Patient ID: Abigail [name], female    DOB: [birth year], 72 y.o.   MRN: [id number]  DOS:  12/28/2022 Type of visit - description: cpx  Here for CPX In general feeling well. No new sxs Denies chest pain, difficulty breathing. History of chronic diarrhea, that has significantly improved.   Review of Systems  Other than above, a 14 point review of systems is negative     Past Medical History:  Diagnosis Date   Asthma    Chiari malformation type I (HCC)    Diabetes mellitus    GERD (gastroesophageal reflux disease)    Hx of gout    Hyperlipidemia    Hyperparathyroidism (HCC)    Hypertension    Lumbar radiculopathy    Postablative hypothyroidism    Sickle cell anemia (HCC)    Spondylolysis    Thyroid disease     Past Surgical History:  Procedure Laterality Date   ABDOMINAL HYSTERECTOMY  1994   NO oophorectomy   BREAST BIOPSY Left 2015   [facility]    CATARACT EXTRACTION Right 08/2019   CERVICAL LAMINECTOMY     at C1 w/ duraplasty   CRANIECTOMY SUBOCCIPITAL W/ CERVICAL LAMINECTOMY / CHIARI  07/05/2011   At C1, performed at [facility]   PARATHYROIDECTOMY  2015   [facility]    Social History   Socioeconomic History   Marital status: Married    Spouse name: Not on file   Number of children: 1   Years of education: Not on file   Highest education level: Not on file  Occupational History   Occupation: retired, still works part Music therapist , high school  Tobacco Use   Smoking status: Never   Smokeless tobacco: Never  Vaping Use   Vaping status: Never Used  Substance and Sexual Activity   Alcohol use: No   Drug use: No   Sexual activity: Not Currently    Partners: Male    Comment: 1st intercourse- 86, married- 46 yrs   Other Topics Concern   Not on file  Social History Narrative   Original from [place]   Married     1 daughter in [place]   Jehovah witness : no transfusions    Social Determinants of Health   Financial Resource Strain: Not on  file  Food Insecurity: Not on file  Transportation Needs: Not on file  Physical Activity: Not on file  Stress: Not on file  Social Connections: Unknown (04/29/2022)   Received from [facility], [facility]   Social Network    Social Network: Not on file  Intimate Partner Violence: Unknown (04/29/2022)   Received from [employer], [facility]   HITS    Physically Hurt: Not on file    Insult or Talk Down To: Not on file    Threaten Physical Harm: Not on file    Scream or Curse: Not on file    Current Outpatient Medications  Medication Instructions   acetaminophen (TYLENOL) 1,000 mg, Every 6 hours PRN   albuterol (VENTOLIN HFA) 108 (90 Base) MCG/ACT inhaler 2 puffs, Inhalation, Every 6 hours PRN   Blood Glucose Monitoring Suppl (ONETOUCH VERIO) w/Device KIT Use 1-4 times daily as needed/directed DX E11.9   Cholecalciferol (VITAMIN D3) 1.25 MG (50000 UT) TABS Once a week   Cobalamin Combinations (B-12 + FOLIC ACID PO) Oral   fexofenadine (ALLEGRA) 180 mg, Oral, Daily   glucose blood test strip Reported on 07/09/2015   levothyroxine (SYNTHROID) 50 mcg, Oral, Daily before  breakfast   loperamide HCl (IMODIUM) 2 mg, 3 times daily PRN   metFORMIN (GLUCOPHAGE-XR) 500 mg, Oral, 3 times daily with meals   Potassium 99 MG TABS Oral       Objective:   Physical Exam BP 130/80   Pulse 66   Temp 98.1 F (36.7 C) (Oral)   Resp 16   Ht 5\' 7"  (1.702 m)   Wt 160 lb 8 oz (72.8 kg)   SpO2 98%   BMI 25.14 kg/m  General: Well developed, NAD, BMI noted Neck: No  thyromegaly  HEENT:  Normocephalic . Face symmetric, atraumatic Lungs:  CTA B Normal respiratory effort, no intercostal retractions, no accessory muscle use. Heart: RRR,  no murmur.  Abdomen:  Not distended, soft, non-tender. No rebound or rigidity.   Lower extremities: no pretibial edema bilaterally  Skin: Exposed areas without rash. Not pale. Not jaundice Neurologic:  alert & oriented X3.  Speech normal, gait  appropriate for age and unassisted Strength symmetric and appropriate for age.  Psych: Cognition and judgment appear intact.  Cooperative with normal attention span and concentration.  Behavior appropriate. No anxious or depressed appearing.     Assessment    Assessment Jehovah witness, no transfusions DM  , onset ~ 2010.  Sees endocrinology Hyperlipidemia  Sickle cell anemia Thyroid dz s/p ablation 2009, on no meds  Hyperparathyroidism -- s/p surgery, [facility], 04-2013 [facility]:  -Lumbar radiculopathy. -H/o Chiari malformation type I,  s/p surgery 2013 @ [facility] -Fibromyalgia?:  See my note 04/25/2019 and  07/15/2022 Fatty liver per ultrasound 11-2015 Renal cyst vs solid mass per u/s 12/2015, MRI 02-2016: Bosniak category 1. Not suspicious. Allergies - alb prn, Dr gallagher Vit D  Def Lactose intolerance as reported by the patient   PLAN: Here for CPX - Td 04-2015 - pnm 23: 2016;  prevnar: 2019.  PNM 20: 12/2022. - S/p shingrix -  flu shot today -Recommend: COVID booster if not done recently  Female care:   -- Saw gynecology 01-2022 --- [facility] 12/2021 ([facility]) --[facility]: Colonoscopy 07-2012 , Dr Norma Fredrickson colonoscopy 05/04/2022.Results essentially normal.  Biopsies negative.  Next per GI --Bones- T score -0.9 (04-2015), T-score -1.2 (12/2021) Rx calcium vitamin D. --Labs:   See orders -- Lifestyle: Exercises regularly, goes to the gym. -Advance care planning: Documents scanned DM: Per endocrinology.  Able to tolerate only half metformin 3 times daily Hyperlipidemia: Will not take his statins "unless it is necessary".  Advised that every patient with DM needs statins, nevertheless she will be reluctant to take chol meds  but likes to check FLP. Thyroid disease: Per endocrinology Vitamin D deficiency: Taking OTC vitamin D once a week ( dosage?).  Check levels. RTC 6 months

## 2022-12-29 LAB — COMPREHENSIVE METABOLIC PANEL
ALT: 15 U/L (ref 0–35)
AST: 21 U/L (ref 0–37)
Albumin: 4.5 g/dL (ref 3.5–5.2)
Alkaline Phosphatase: 84 U/L (ref 39–117)
BUN: 15 mg/dL (ref 6–23)
CO2: 31 meq/L (ref 19–32)
Calcium: 9.1 mg/dL (ref 8.4–10.5)
Chloride: 102 meq/L (ref 96–112)
Creatinine, Ser: 0.84 mg/dL (ref 0.40–1.20)
GFR: 70.17 mL/min (ref >60.00)
Glucose, Bld: 93 mg/dL (ref 70–99)
Potassium: 4.4 meq/L (ref 3.5–5.1)
Sodium: 139 meq/L (ref 135–145)
Total Bilirubin: 0.4 mg/dL (ref 0.2–1.2)
Total Protein: 7.5 g/dL (ref 6.0–8.3)

## 2022-12-29 LAB — LIPID PANEL
Cholesterol: 178 mg/dL (ref 0–200)
HDL: 62.8 mg/dL (ref >39.00)
LDL Cholesterol: 101 mg/dL — ABNORMAL HIGH (ref 0–99)
NonHDL: 115.59
Total CHOL/HDL Ratio: 3
Triglycerides: 74 mg/dL (ref 0.0–149.0)
VLDL: 14.8 mg/dL (ref 0.0–40.0)

## 2022-12-29 LAB — VITAMIN D 25 HYDROXY (VIT D DEFICIENCY, FRACTURES): VITD: 53.88 ng/mL (ref 30.00–100.00)

## 2023-01-21 ENCOUNTER — Other Ambulatory Visit (HOSPITAL_BASED_OUTPATIENT_CLINIC_OR_DEPARTMENT_OTHER): Payer: Self-pay | Admitting: Internal Medicine

## 2023-01-21 DIAGNOSIS — Z139 Encounter for screening, unspecified: Secondary | ICD-10-CM

## 2023-01-24 ENCOUNTER — Ambulatory Visit (HOSPITAL_BASED_OUTPATIENT_CLINIC_OR_DEPARTMENT_OTHER)
Admission: RE | Admit: 2023-01-24 | Discharge: 2023-01-24 | Disposition: A | Discharge status: Home / Self Care | Payer: Medicare Other | Source: Ambulatory Visit | Attending: Internal Medicine | Admitting: Internal Medicine

## 2023-01-24 ENCOUNTER — Encounter (HOSPITAL_BASED_OUTPATIENT_CLINIC_OR_DEPARTMENT_OTHER): Payer: Self-pay

## 2023-01-24 DIAGNOSIS — Z1231 Encounter for screening mammogram for malignant neoplasm of breast: Secondary | ICD-10-CM | POA: Diagnosis not present

## 2023-01-24 DIAGNOSIS — Z139 Encounter for screening, unspecified: Secondary | ICD-10-CM

## 2023-01-31 ENCOUNTER — Telehealth: Payer: Self-pay

## 2023-01-31 ENCOUNTER — Other Ambulatory Visit (HOSPITAL_BASED_OUTPATIENT_CLINIC_OR_DEPARTMENT_OTHER): Payer: Self-pay

## 2023-01-31 DIAGNOSIS — E1165 Type 2 diabetes mellitus with hyperglycemia: Secondary | ICD-10-CM

## 2023-01-31 MED ORDER — ONETOUCH VERIO VI STRP
ORAL_STRIP | 3 refills | Status: AC
  Filled 2023-01-31: qty 400, 100d supply, fill #0
  Filled 2023-08-04: qty 400, 100d supply, fill #1

## 2023-01-31 MED ORDER — ONETOUCH VERIO FLEX SYSTEM W/DEVICE KIT
PACK | 0 refills | Status: AC
  Filled 2023-01-31: qty 1, 90d supply, fill #0

## 2023-01-31 MED ORDER — ONETOUCH DELICA LANCETS 33G MISC
3 refills | Status: AC
  Filled 2023-01-31: qty 400, 100d supply, fill #0

## 2023-01-31 NOTE — Telephone Encounter (Signed)
 Requested Prescriptions   Signed Prescriptions Disp Refills   Blood Glucose Monitoring Suppl (ONETOUCH VERIO) w/Device KIT 1 kit 0    Sig: Use to check blood sugar 1-4 times a day DxCode: E11.65    Authorizing Provider: TRIXIE FILE    Ordering User: Imaan Padgett S   OneTouch Delica Lancets 33G MISC 400 each 3    Sig: Use to check blood sugar 1-4 times a day DxCode: E11.65    Authorizing Provider: TRIXIE FILE    Ordering User: Stacy Sailer S   glucose blood (ONETOUCH VERIO) test strip 400 each 3    Sig: Use to check blood sugar 1-4 times a day DxCode: E11.65    Authorizing Provider: TRIXIE FILE    Ordering User: CLEOTILDE ROLIN RAMAN

## 2023-02-04 ENCOUNTER — Other Ambulatory Visit (HOSPITAL_BASED_OUTPATIENT_CLINIC_OR_DEPARTMENT_OTHER): Payer: Self-pay

## 2023-02-09 ENCOUNTER — Ambulatory Visit: Payer: Self-pay | Admitting: Internal Medicine

## 2023-02-09 ENCOUNTER — Ambulatory Visit (INDEPENDENT_AMBULATORY_CARE_PROVIDER_SITE_OTHER): Payer: Medicare Other | Admitting: Family Medicine

## 2023-02-09 ENCOUNTER — Other Ambulatory Visit (HOSPITAL_BASED_OUTPATIENT_CLINIC_OR_DEPARTMENT_OTHER): Payer: Self-pay

## 2023-02-09 VITALS — BP 91/63 | HR 86 | Temp 97.8°F | Ht 67.0 in | Wt 160.6 lb

## 2023-02-09 DIAGNOSIS — R051 Acute cough: Secondary | ICD-10-CM | POA: Diagnosis not present

## 2023-02-09 DIAGNOSIS — J069 Acute upper respiratory infection, unspecified: Secondary | ICD-10-CM | POA: Diagnosis not present

## 2023-02-09 LAB — POCT INFLUENZA A/B
Influenza A, POC: NEGATIVE
Influenza B, POC: NEGATIVE

## 2023-02-09 LAB — POC COVID19 BINAXNOW: SARS Coronavirus 2 Ag: NEGATIVE

## 2023-02-09 MED ORDER — BENZONATATE 100 MG PO CAPS
100.0000 mg | ORAL_CAPSULE | Freq: Two times a day (BID) | ORAL | 0 refills | Status: DC | PRN
  Filled 2023-02-09: qty 20, 10d supply, fill #0

## 2023-02-09 NOTE — Progress Notes (Signed)
 Acute Office Visit  Subjective:     Patient ID: Abigail [name], female    DOB: 08/19/1950, 73 y.o.   MRN: [id number]  Chief Complaint  Patient presents with   Acute Visit    HPI Patient is in today for  URI symptoms.    Discussed the use of AI scribe software for clinical note transcription with the patient, who gave verbal consent to proceed.  History of Present Illness   The patient, who has regular contact with a nursing home environment due to their spouse's residency, began experiencing symptoms of illness following a visit to the facility. They reported the onset of a sore throat immediately after leaving the nursing home, where many residents were observed to be coughing. The patient's symptoms have since progressed to include coughing, generalized body pain, and a slight earache. They also reported a mild headache and increased frequency of bowel movements, though not to the point of diarrhea.  The patient has not experienced any fever, chills, or sweating, and denies any difficulty breathing. However, they have noticed a lack of appetite and have been consuming less food and drink than usual. Despite this, they have been making an effort to stay hydrated by drinking homemade ginger tea and water.  The patient's cough is primarily dry, and they have not taken any medications for their symptoms, relying instead on home remedies such as ginger tea.            ROS All review of systems negative except what is listed in the HPI      Objective:    BP 91/63   Pulse 86   Temp 97.8 F (36.6 C)   Ht 5\' 7"  (1.702 m)   Wt 160 lb 9.6 oz (72.8 kg)   SpO2 100%   BMI 25.15 kg/m    Physical Exam Vitals reviewed.  Constitutional:      General: She is not in acute distress.    Appearance: Normal appearance. She is not ill-appearing.  HENT:     Head: Normocephalic and atraumatic.     Right Ear: Tympanic membrane normal.     Left Ear: Tympanic membrane normal.      Nose: Congestion and rhinorrhea present.     Mouth/Throat:     Pharynx: No oropharyngeal exudate or posterior oropharyngeal erythema.  Cardiovascular:     Rate and Rhythm: Normal rate and regular rhythm.     Heart sounds: Normal heart sounds.  Pulmonary:     Effort: Pulmonary effort is normal.     Breath sounds: Normal breath sounds.  Musculoskeletal:     Cervical back: Normal range of motion and neck supple. No tenderness.  Lymphadenopathy:     Cervical: No cervical adenopathy.  Skin:    General: Skin is warm and dry.  Neurological:     Mental Status: She is alert and oriented to person, place, and time.  Psychiatric:        Mood and Affect: Mood normal.        Behavior: Behavior normal.        Thought Content: Thought content normal.        Judgment: Judgment normal.      Results for orders placed or performed in visit on 02/09/23  POC COVID-19 BinaxNow  Result Value Ref Range   SARS Coronavirus 2 Ag Negative Negative  POCT Influenza A/B  Result Value Ref Range   Influenza A, POC Negative Negative   Influenza B, POC Negative Negative  Assessment & Plan:   Problem List Items Addressed This Visit   None Visit Diagnoses       Acute cough    -  Primary   Relevant Orders   POC COVID-19 BinaxNow (Completed)   POCT Influenza A/B (Completed)     Viral URI with cough       Relevant Medications   benzonatate  (TESSALON ) 100 MG capsule      COVID and Flu negative today.  Likely Viral Upper Respiratory Infection  Continue supportive measures including rest, hydration, humidifier use, steam showers, warm compresses to sinuses, warm liquids with lemon and honey, and over-the-counter cough, cold, and analgesics as needed. If symptoms persist 8-10 days, become severe, or return after a few days of feeling better, then please follow-up for repeat evaluation to determine if antibiotics may be necessary. Adding Tessalon  for cough.  Patient aware of signs/symptoms  requiring further/urgent evaluation.    Meds ordered this encounter  Medications   benzonatate  (TESSALON ) 100 MG capsule    Sig: Take 1 capsule (100 mg total) by mouth 2 (two) times daily as needed for cough.    Dispense:  20 capsule    Refill:  0    Supervising Provider:   Randie Bustle A [[id number]]    Return if symptoms worsen or fail to improve.  Abigail Hock, NP

## 2023-02-09 NOTE — Telephone Encounter (Signed)
 Copied from CRM [id number]. Topic: Appointments - Appointment Scheduling >> Feb 09, 2023  8:25 AM [place] A wrote: Patient is having pain on her right side of back, sore throat, chest congestion and nose; Pt said lastnight the pain on her right backside was an 8   Chief Complaint: back pain, cold symptoms Symptoms: runny nose, sore throat, back pain, chest congestion, not coughing up anything, dry cough, coughing fits, numbness to right hand, pain shoots down legs sometimes Frequency: continual Pertinent Negatives: Patient denies current severe pain, SOB, chest pain, known injury to back, pain with breathing in, changes to bowel/bladder control, pain with urination Disposition: [] 911 / [] ED /[] Urgent Care (no appt availability in office) / [x] Appointment(In office/virtual)/ []  [facility]/ [] Home Care/ [] Refused Recommended Disposition /[] [facility]/ []  Follow-up with PCP Additional Notes: Pt reporting symptoms started yesterday, "couldn't get out of bed, started drinking tea, stay in bed all night long until now, still pain there, started yesterday in afternoon." Pt confirms she is experiencing runny nose, sore throat, congestion "in chest" but "not coughing anything up, dry cough" but has coughing fits. Pt confirms she is experiencing pain to "right side of back" that was 8/10 "last night but pain is 5/10 right now," thinking "for my lung," "don't feel anything in the left side." Pt confirms no trouble breathing, no pain with breathing in. Pt reporting that she also had pain to "low back last night was really bad" but "not too bad" today. Pt reporting "really bad" pain more to left leg than right, was "10/10" pain "last night woke up with cramp in both legs really bad but not now." Pt also reporting "numbness in right hand right now" and "feel discomfort in belly, feel sick" but confirms no vomiting, diarrhea, no abdominal pain, no nausea. Pt reporting no changes with bladder/bowel  control or pain with urination, no weakness or numbness to legs. Pt confirms still able to walk, no current severe pain. Advised pt be examined with 4 hours per protocol, scheduled for today with PCP office. Advised go to ED if worsening symptoms, severe pain, SOB, chest pain, numbness worsens in body. Pt verbalized understanding.  Reason for Disposition  [1] Pain radiates into the thigh or further down the leg AND [2] both legs  Answer Assessment - Initial Assessment Questions 1. ONSET: "When did the pain begin?"      yesterday 2. LOCATION: "Where does it hurt?" (upper, mid or lower back)     Right side of back thinking for my lung, confirms not her side not near front no chest pain, in line with bra/breast in back, also low back pain last night, cramping in both legs last night really bad 3. SEVERITY: "How bad is the pain?"  (e.g., Scale 1-10; mild, moderate, or severe)   - MILD (1-3): Doesn't interfere with normal activities.    - MODERATE (4-7): Interferes with normal activities or awakens from sleep.    - SEVERE (8-10): Excruciating pain, unable to do any normal activities.       pain to "right side of back" that was 8/10 "last night but pain is 5/10 right now," "Really bad" pain more to left leg 10/10 pain. Last night woke up with cramp in both legs really bad but not now"  4. PATTERN: "Is the pain constant?" (e.g., yes, no; constant, intermittent)      Constant, worsening at times 5. RADIATION: "Does the pain shoot into your legs or somewhere else?"     "  Really bad" pain more to left leg 10/10 pain. Last night woke up with cramp in both legs really bad but not now"  6. CAUSE:  "What do you think is causing the back pain?"      thinking "for my lung," because ill with runny nose, sore throat, cough, congestion in chest with dry cough 7. BACK OVERUSE:  "Any recent lifting of heavy objects, strenuous work or exercise?"     Denies known injury 9. NEUROLOGIC SYMPTOMS: "Do you have any  weakness, numbness, or problems with bowel/bladder control?"     also "numbness in right hand right now." 10. OTHER SYMPTOMS: "Do you have any other symptoms?" (e.g., fever, abdomen pain, burning with urination, blood in urine)       Feel discomfort in belly, feel sick, no other way to describe it, but no vomiting, no diarrhea, no pain in abdomen, no nausea. No SOB, no pain with breathing in. Sore throat, runny nose, congestion in chest but not coughing anything up, dry cough, coughing fits  Protocols used: Back Pain-A-AH

## 2023-02-09 NOTE — Patient Instructions (Signed)
 Likely Viral Upper Respiratory Infection  Continue supportive measures including rest, hydration, humidifier use, steam showers, warm compresses to sinuses, warm liquids with lemon and honey, and over-the-counter cough, cold, and analgesics as needed. If symptoms persist 8-10 days, become severe, or return after a few days of feeling better, then please follow-up for repeat evaluation to determine if antibiotics may be necessary.  Over the counter medications that may be helpful for symptoms:  Guaifenesin 1200 mg extended release tabs twice daily, with plenty of water For cough and congestion Brand name: Mucinex   Pseudoephedrine 30 mg, one or two tabs every 4 to 6 hours For sinus congestion Brand name: Sudafed You must get this from the pharmacy counter.  Oxymetazoline nasal spray each morning, one spray in each nostril, for NO MORE THAN 3 days  For nasal and sinus congestion Brand name: Afrin Saline nasal spray or Saline Nasal Irrigation (Netti Pot, etc) 3-5 times a day For nasal and sinus congestion Brand names: Ocean or AYR Fluticasone nasal spray OR Mometasone nasal spray OR Triamcinolone Acetonide nasal spray - follow directions on the packaging For nasal and sinus congestion Brand name: Flonase, Nasonex, Nasacort Warm salt water gargles  For sore throat Every few hours as needed Alternate ibuprofen 400-600 mg and acetaminophen 1000 mg every 6 hours For fever, body aches, headache Brand names: Motrin or Advil and Tylenol Dextromethorphan 12-hour cough version 30 mg every 12 hours  For cough Brand name: Delsym Stop all other cold medications for now (Nyquil, Dayquil, Tylenol Cold, Theraflu, etc) and other non-prescription cough/cold preparations. Many of these have the same ingredients listed above and could cause an overdose of medication.   Herbal treatments that have been shown to be helpful in some patients include: Vitamin C 1000 mg per day Zinc 100 mg per day Quercetin  25-500 mg twice a day Melatonin 5-10mg  at bedtime Honey Green Tea  General Instructions Allow your body to rest Drink PLENTY of fluids Typically, we are the most contagious 1-2 days before symptoms start through the first 2-3 days of most severe symptoms. Per CDC guidelines, you can return to school/work when symptoms have started to improve and you have been fever-free for 24 hours. However, recommend you continue extra precautions for the following 5 days (frequent hand hygiene, masking, covering coughs/sneezes, minimize exposure to immunocompromised individuals, etc).  If you develop severe shortness of breath, uncontrolled fevers, coughing up blood, confusion, chest pain, or signs of dehydration (such as significantly decreased urine amounts or dizziness with standing) please go to the nearest ER.

## 2023-02-21 ENCOUNTER — Other Ambulatory Visit (HOSPITAL_BASED_OUTPATIENT_CLINIC_OR_DEPARTMENT_OTHER): Payer: Self-pay

## 2023-04-13 ENCOUNTER — Ambulatory Visit: Payer: Medicare Other | Admitting: Internal Medicine

## 2023-04-13 ENCOUNTER — Encounter: Payer: Self-pay | Admitting: Internal Medicine

## 2023-04-13 VITALS — BP 120/70 | HR 73 | Ht 67.0 in | Wt 161.4 lb

## 2023-04-13 DIAGNOSIS — Z7984 Long term (current) use of oral hypoglycemic drugs: Secondary | ICD-10-CM

## 2023-04-13 DIAGNOSIS — E890 Postprocedural hypothyroidism: Secondary | ICD-10-CM | POA: Diagnosis not present

## 2023-04-13 DIAGNOSIS — E210 Primary hyperparathyroidism: Secondary | ICD-10-CM

## 2023-04-13 DIAGNOSIS — R7989 Other specified abnormal findings of blood chemistry: Secondary | ICD-10-CM

## 2023-04-13 DIAGNOSIS — E039 Hypothyroidism, unspecified: Secondary | ICD-10-CM

## 2023-04-13 DIAGNOSIS — E1165 Type 2 diabetes mellitus with hyperglycemia: Secondary | ICD-10-CM

## 2023-04-13 DIAGNOSIS — E559 Vitamin D deficiency, unspecified: Secondary | ICD-10-CM | POA: Diagnosis not present

## 2023-04-13 LAB — POCT GLYCOSYLATED HEMOGLOBIN (HGB A1C): Hemoglobin A1C: 6.4 % — AB (ref 4.0–5.6)

## 2023-04-13 NOTE — Progress Notes (Signed)
 Patient ID: Abigail [name], female   DOB: [birth year], 73 y.o.   MRN: [id number]  HPI: Abigail [name] is a 73 y.o.-year-old female, returning for f/u for DM2, dx in ~2010, non-insulin-dependent, controlled, without complications and h/o primary HPTH.  She is the wife of Abigail [name], also my patient. Last visit 6 months ago.  Interim history: No increased urination, blurry vision, nausea, chest pain.   At last visit, she had diarrhea, initially improved after stopping Rybelsus, but then recurred.  She did not feel that this was related to metformin.  Since last visit, normal, her diarrhea resolved, she started a supplement with glutathione (Immunocal). She started exercise at [employer] since last visit. She continues to go to the gym. She had more dried fruit since last OV - sugars were a little higher.  DM2: Reviewed HbA1c levels: Lab Results  Component Value Date   HGBA1C 6.3 (A) 10/14/2022   HGBA1C 6.3 (A) 04/07/2022   HGBA1C 7.6 03/25/2022   Pt is on a regimen of: - Metformin ER 500 mg 2x >> 3x a day She could not tolerate Rybelsus >> diarrhea, lows CBGs. She was previously on Prandin 1 mg before larger meals, but she kept forgetting it.  Pt checks her sugars 1-2x a day per review of her log: - am: 95-100, 115 >> 88-113, 125 >> 90-121, 147 >> 135-165 - 2h after b'fast: 98-104 >> 107 >> n/c >> 97-133, 174 >> n/c - before lunch: 121 >> n/c >> 108, 165 >> 89-153 >> 93-122 - 2h after lunch: 149-200 >> 83-118 >> 68, 93-134 >> 99-121 - before dinner: 79 >> n/c >> 83-108 >> 101-134 >> 96-148 - 2h after dinner: 121, 122, 170 >> 80-149 >> 125-159, 217 - bedtime: n/c >> 132 >> n/c >> 152 >> n/c >> 104-153 - nighttime: n/c >> 137 Lowest sugar was 58 >> ... 83 >> 68 >> 93; she has hypoglycemia awareness in the 80s. Highest sugar was 269 (plantains, forgot meds) ...>> 170 >> 174 >> 217.  Glucometer: ReliOn  Pt's meals are: - Breakfast: Fruit, eggs - Lunch:  chicken/fish + veggies - no red meat  - Dinner: veggies + fruit +/- chicken/fish - Snacks: cashews, fruit (Mango)  No CKD, last BUN/creatinine:  Lab Results  Component Value Date   BUN 15 12/28/2022   CREATININE 0.84 12/28/2022   Lab Results  Component Value Date   MICRALBCREAT 1.1 11/04/2022   MICRALBCREAT 29.999 10/07/2022   MICRALBCREAT 0.8 11/27/2021   MICRALBCREAT NOTE 09/01/2018   MICRALBCREAT 0.6 05/06/2015   + HL; last set of lipids: Lab Results  Component Value Date   CHOL 178 12/28/2022   HDL 62.80 12/28/2022   LDLCALC 101 (H) 12/28/2022   TRIG 74.0 12/28/2022   CHOLHDL 3 12/28/2022  She is not on a statin - declined.  She was previously on pravastatin but reportedly this was stopped due to good control. She has a history of fatty liver per ultrasound from 2017. I again recommended Pravastatin at last OV >> refused.  - last eye exam was on 08/17/2021: No DR-she had cataract surgery.   -+ Numbness and tingling in her feet.  She has a history of surgery on her toe.  Last foot exam  04/07/2022.  H/o primary hyperparathyroidism  - s/p 2x parathyroid glands resected in 2015, with resolution of her hypercalcemia  She will in the ED with paresthesia and pain in the right arm on 01/08/2019.  At that time, calcium was slightly  low, at 8.7 (8.9-10.3).  It normalized afterwards.  I previously recommended Tums x 1 tab with dinner every night.  She did not start this.  Calcium was normal at last check: Lab Results  Component Value Date   CALCIUM 9.1 12/28/2022   CALCIUM 9.1 07/15/2022   CALCIUM 9.0 11/27/2021   CALCIUM 9.0 07/20/2021   CALCIUM 9.2 04/14/2021   CALCIUM 9.4 06/20/2020   CALCIUM 9.2 06/11/2020   CALCIUM 9.0 10/17/2019   CALCIUM 8.9 01/16/2019   CALCIUM 8.7 (L) 01/08/2019   Vitamin D deficiency:  Reviewed vitamin D levels: Lab Results  Component Value Date   VD25OH 53.88 12/28/2022   VD25OH 47.73 04/07/2022   VD25OH 42.17 04/14/2021   VD25OH 40  10/17/2019   VD25OH 31.28 01/16/2019   VD25OH 35.44 02/16/2018   VD25OH 14.17 (L) 08/12/2017  She is on 5000 is vitamin D daily.  Reviewed vitamin B-12 levels: Lab Results  Component Value Date   VITAMINB12 317 11/04/2022   VITAMINB12 1,240 (H) 04/07/2022   VITAMINB12 595 07/20/2021   VITAMINB12 507 04/14/2021   VITAMINB12 560 01/16/2020   VITAMINB12 616 07/18/2019   VITAMINB12 >1500 (H) 01/16/2019   VITAMINB12 >1500 (H) 07/20/2017   VITAMINB12 623 07/21/2016   VITAMINB12 394 07/09/2015  In the past, on B12 2000 mcg daily but on this dose, her level returned high.  We decreased the dose to 1000 mcg daily but she stopped it.  We continued off the supplement.  Previously on gabapentin, now off.   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  She was started on LT4 50 mcg daily by PCP.  She takes this: - in am - fasting - at least 60 min from b'fast - no calcium - no iron - no multivitamins - no PPIs - not on Biotin  Reviewed her TSH levels: Lab Results  Component Value Date   TSH 4.79 11/04/2022   TSH 2.22 09/22/2022   TSH 10.88 (H) 07/30/2022   TSH 11.83 (H) 07/15/2022   TSH 7.22 (H) 04/07/2022   TSH 2.17 07/20/2021   TSH 5.28 04/14/2021   TSH 3.63 11/07/2020   TSH 5.67 (H) 06/13/2020   TSH 6.03 (H) 06/11/2020  03/18/2022: TSH 15.36  ROS: + see HPI  I reviewed pt's medications, allergies, PMH, social hx, family hx, and changes were documented in the history of present illness. Otherwise, unchanged from my initial visit note.    Past Medical History:  Diagnosis Date   Asthma    Chiari malformation type I (HCC)    Diabetes mellitus    GERD (gastroesophageal reflux disease)    Hx of gout    Hyperlipidemia    Hyperparathyroidism (HCC)    Hypertension    Lumbar radiculopathy    Postablative hypothyroidism    Sickle cell anemia (HCC)    Spondylolysis    Thyroid disease     Past Surgical History:  Procedure Laterality Date   ABDOMINAL HYSTERECTOMY  1994   NO oophorectomy   BREAST BIOPSY Left 2015   [facility]    CATARACT EXTRACTION Right 08/2019   CERVICAL LAMINECTOMY     at C1 w/ duraplasty   CRANIECTOMY SUBOCCIPITAL W/ CERVICAL LAMINECTOMY / CHIARI  07/05/2011   At C1, performed at [facility]   PARATH[facility]ROIDECTOMY  2015   [facility]    Social History   Social History   Marital Status: Married    Spouse Name: N/A  Number of Children: 1   Occupational History   teacher - high school     Social History Main Topics   Smoking status: Never Smoker    Smokeless tobacco: Not on file   Alcohol Use: No   Drug Use: No   Social History Narrative   Original from [place]   Married x 44 years   Current Outpatient Medications on File Prior to Visit  Medication Sig Dispense Refill   acetaminophen (TYLENOL) 500 MG tablet Take 1,000 mg by mouth every 6 (six) hours as needed.     albuterol (VENTOLIN HFA) 108 (90 Base) MCG/ACT inhaler Inhale 2 puffs into the lungs every 6 (six) hours as needed for wheezing or shortness of breath. 18 g 1   benzonatate (TESSALON) 100 MG capsule Take 1 capsule (100 mg total) by mouth 2 (two) times daily as needed for cough. 20 capsule 0   Blood Glucose Monitoring Suppl (ONETOUCH VERIO FLEX SYSTEM) w/Device KIT Use to check blood sugar 1-4 times a day 1 kit 0   Cholecalciferol (VITAMIN D3) 1.25 MG (50000 UT) TABS Once a week     Cobalamin Combinations (B-12 + FOLIC ACID PO) Take by mouth.     fexofenadine (ALLEGRA) 180 MG tablet Take 180 mg by mouth daily.     glucose blood (ONETOUCH VERIO) test strip Use to check blood sugar 1-4 times a day 400 each 3   levothyroxine (SYNTHROID) 50 MCG tablet Take 1 tablet (50 mcg total) by mouth daily before breakfast. 90 tablet 1   loperamide HCl (IMODIUM) 1 MG/7.5ML suspension Take 2 mg by mouth 3 (three) times daily as needed for diarrhea or loose stools.     metFORMIN (GLUCOPHAGE-XR) 500 MG 24 hr tablet  Take 1 tablet (500 mg total) by mouth 3 (three) times daily with meals. 270 tablet 3   OneTouch Delica Lancets 33G MISC Use to check blood sugar 1-4 times a day 400 each 3   Potassium 99 MG TABS Take by mouth.     No current facility-administered medications on file prior to visit.   Allergies  Allergen Reactions   Pollen Extract Itching   Citrus Itching, Rash and Swelling   Ibuprofen Swelling and Rash    Had a reaction to ibuprofen x1 but reports she is able to take aspirin   Other Itching, Rash and Swelling    seafood. seafood   Family History  Problem Relation Age of Onset   Hypertension Sister        sister, daughter , mother    Hyperlipidemia Sister    Thyroid disease Sister    Diabetes Daughter    Stroke Mother    Colon cancer Neg Hx    Sudden death Neg Hx    Heart attack Neg Hx    Breast cancer Neg Hx    Stomach cancer Neg Hx    PE: BP 120/70   Pulse 73   Ht 5\' 7"  (1.702 m)   Wt 161 lb 6.4 oz (73.2 kg)   SpO2 96%   BMI 25.28 kg/m  Wt Readings from Last 10 Encounters:  04/13/23 161 lb 6.4 oz (73.2 kg)  02/09/23 160 lb 9.6 oz (72.8 kg)  12/28/22 160 lb 8 oz (72.8 kg)  11/17/22 164 lb 6 oz (74.6 kg)  10/14/22 162 lb (73.5 kg)  09/22/22 163 lb 6 oz (74.1 kg)  07/15/22 158 lb 6 oz (71.8 kg)  05/26/22 157 lb 4 oz (71.3 kg)  04/07/22 156 lb 6.4  oz (70.9 kg)  02/25/22 163 lb 8 oz (74.2 kg)   Constitutional: normal weight, in NAD Eyes: no exophthalmos ENT: no masses palpated in neck, no cervical lymphadenopathy Cardiovascular: RRR, No MRG Respiratory: CTA B Musculoskeletal: no deformities Skin: no rashes Neurological: no tremor with outstretched hands  ASSESSMENT: 1. DM2, non-insulin-dependent, now controlled, without complications  2. History of hyperparathyroidism -  status post 2 gland parathyroidectomy in 2015   3. Vit D def  4.  History of elevated B12 vitamin  5.  History of elevated TSH  PLAN:  1. Patient with well-controlled type 2  diabetes, on metformin ER only.  She tried Rybelsus in the past but developed diarrhea and low blood sugars.  At last visit, however, she still had diarrhea, exacerbated by antibiotics.  She was seeing Dr. Carleene Overlie.  She did not feel that her diarrhea was related to metformin so in the setting of well-controlled blood sugars and an HbA1c stable at 6.3%, we did not change her regimen.  We discussed about continuing to improve diet.  She had started consistent exercise which I advised her to continue. -At today's visit, sugars are mostly at goal with occasional upper glycemic exceptions.  These are mild, however.  She does mention eating more dry figs and dates, and we discussed that these do concentrated sugar.  I advised her to try to focus more on fresh fruit.  Otherwise, I do not feel that we need to change her regimen.  HbA1c today is only slightly higher than before. - I suggested to:  Patient Instructions  Please continue: - Metformin ER 500 mg 3x a day  Also, continue: - vitamin D 5000 units daily  Continue Levothyroxine 50 mcg daily.  Take the thyroid hormone every day, with water, at least 30 minutes before breakfast, separated by at least 4 hours from: - acid reflux medications - calcium - iron - multivitamins  Please return in 6 months with your sugar log.   - we checked her HbA1c: 6.4% (slightly higher) - advised to check sugars at different times of the day - 1x a day, rotating check times - advised for yearly eye exams >> she is UTD - I previously recommended to restart a statin, after discussion about risks and benefits: Pravastatin 20 mg daily.  She did not start it due to fear of side effects as she had 2 friends that did not feel good on statins... - return to clinic in 6 months  2. History of primary hyperparathyroidism -She is status post parathyroidectomy -In 2020, she was in the emergency room with paresthesia and pain in the right arm and was found to have a  minimally low calcium, at 8.7.  After this, her calcium level improved and PTH and vitamin D levels remained normal. -I previously recommended to take 1 Tums tablet with dinner but she did not start this -Latest calcium level was normal: Lab Results  Component Value Date   CALCIUM 9.1 12/28/2022   PHOS 4.4 01/16/2019   3. Vit D def -She takes 5000 units vitamin D daily-came off for a period of time but this was restarted by PCP -Vitamin D level was excellent at last check: Lab Results  Component Value Date   VD25OH 53.88 12/28/2022   VD25OH 47.73 04/07/2022   VD25OH 42.17 04/14/2021   4.  Elevated B12 vitamin -In 03/2022, her B12 level was slightly high so I advised her to start B12 supplements.  Latest B12 level decreased to low  normal range: Lab Results  Component Value Date   VITAMINB12 317 11/04/2022   VITAMINB12 1,240 (H) 04/07/2022   VITAMINB12 595 07/20/2021  -We will repeat this at next visit  5.  Acquired hypothyroidism -She had fluctuating TFTs in the past, with the highest being on 03/08/2022, at 15.3.  This was after COVID episode so we decided to repeat this and see if she absolutely needs levothyroxine.  Her TSH was still high at last check so PCP started her on LT4 50 mcg daily. - latest thyroid labs reviewed with pt. >> normal: Lab Results  Component Value Date   TSH 4.79 11/04/2022  - she continues on LT4 50 mcg daily - pt feels good on this dose. - we discussed about taking the thyroid hormone every day, with water, >30 minutes before breakfast, separated by >4 hours from acid reflux medications, calcium, iron, multivitamins. Pt. is taking it correctly. - will check thyroid tests at next visit  Carlus Pavlov, MD PhD [facility]

## 2023-04-13 NOTE — Patient Instructions (Signed)
 Please continue: - Metformin ER 500 mg 3x a day  Also, continue: - vitamin D 5000 units daily  Continue Levothyroxine 50 mcg daily.  Take the thyroid hormone every day, with water, at least 30 minutes before breakfast, separated by at least 4 hours from: - acid reflux medications - calcium - iron - multivitamins  Please return in 6 months.

## 2023-05-06 ENCOUNTER — Other Ambulatory Visit (HOSPITAL_BASED_OUTPATIENT_CLINIC_OR_DEPARTMENT_OTHER): Payer: Self-pay

## 2023-05-06 ENCOUNTER — Other Ambulatory Visit: Payer: Self-pay | Admitting: Internal Medicine

## 2023-05-06 DIAGNOSIS — E039 Hypothyroidism, unspecified: Secondary | ICD-10-CM

## 2023-05-06 MED ORDER — LEVOTHYROXINE SODIUM 50 MCG PO TABS
50.0000 ug | ORAL_TABLET | Freq: Every day | ORAL | 1 refills | Status: DC
  Filled 2023-05-06: qty 90, 90d supply, fill #0
  Filled 2023-08-04: qty 90, 90d supply, fill #1

## 2023-05-29 IMAGING — DX DG THORACIC SPINE 3V
3 series · 3 of 3 positions shown · non-contrast
Comparison: Lumbar spine radiographs-earlier same day

CLINICAL DATA: Chronic back pain.

EXAM:
THORACIC SPINE - 3 VIEWS

[t-spine ap]
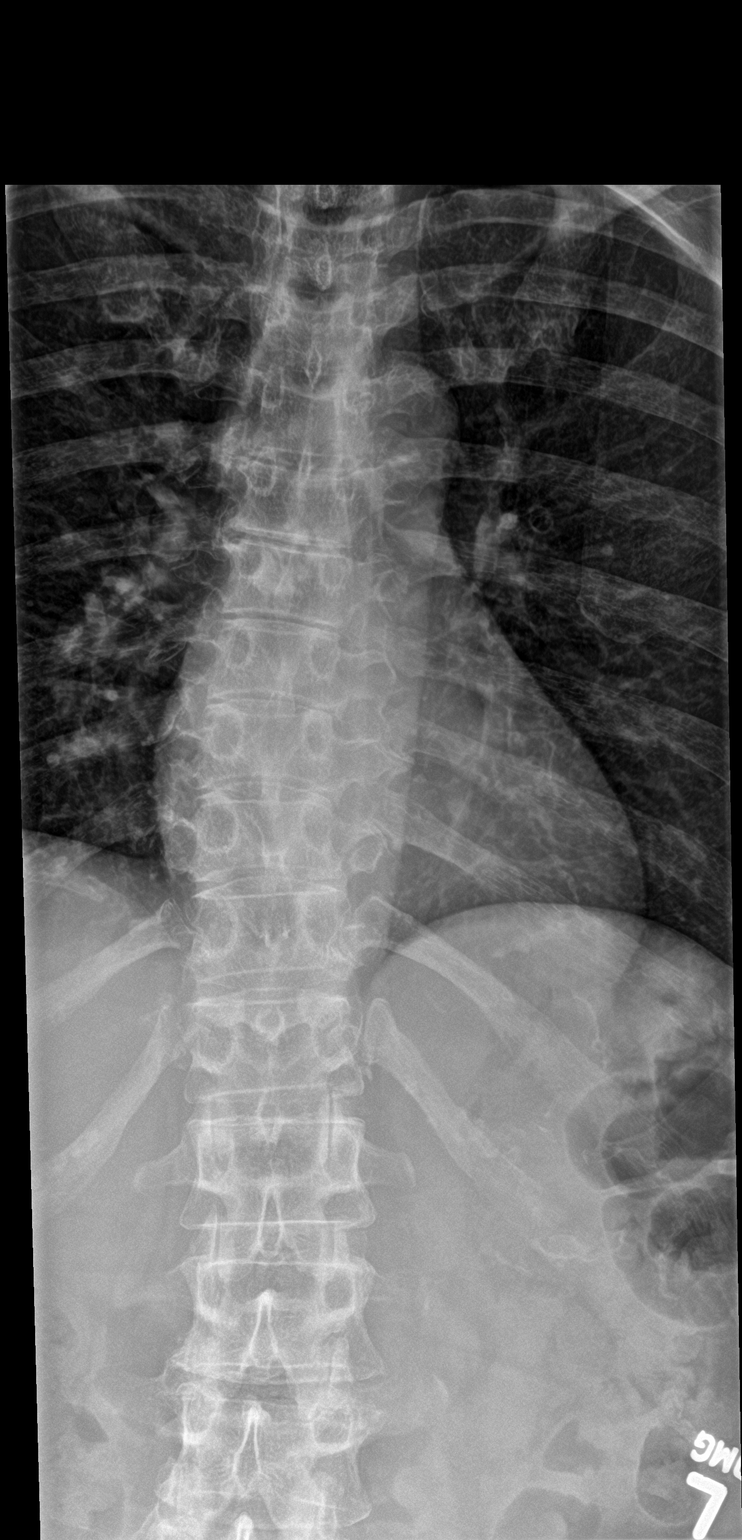

[t-spine lat]
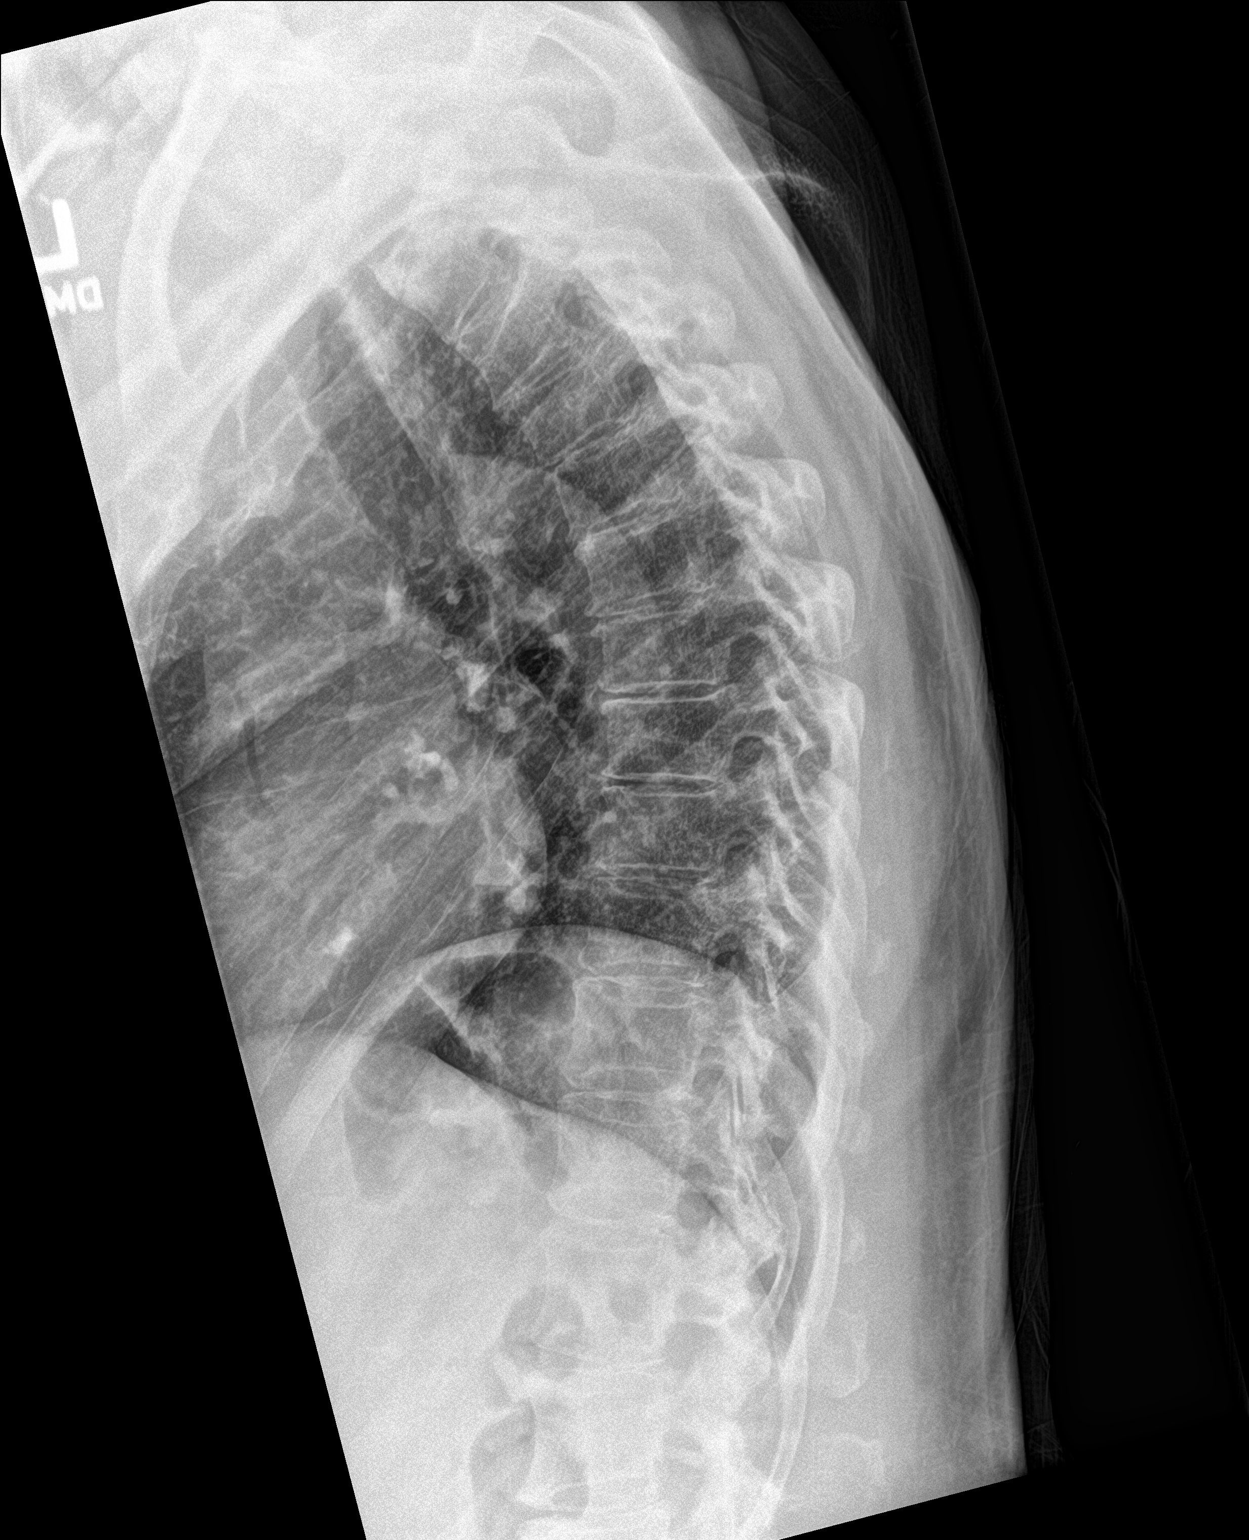

[t-spine swimmers]
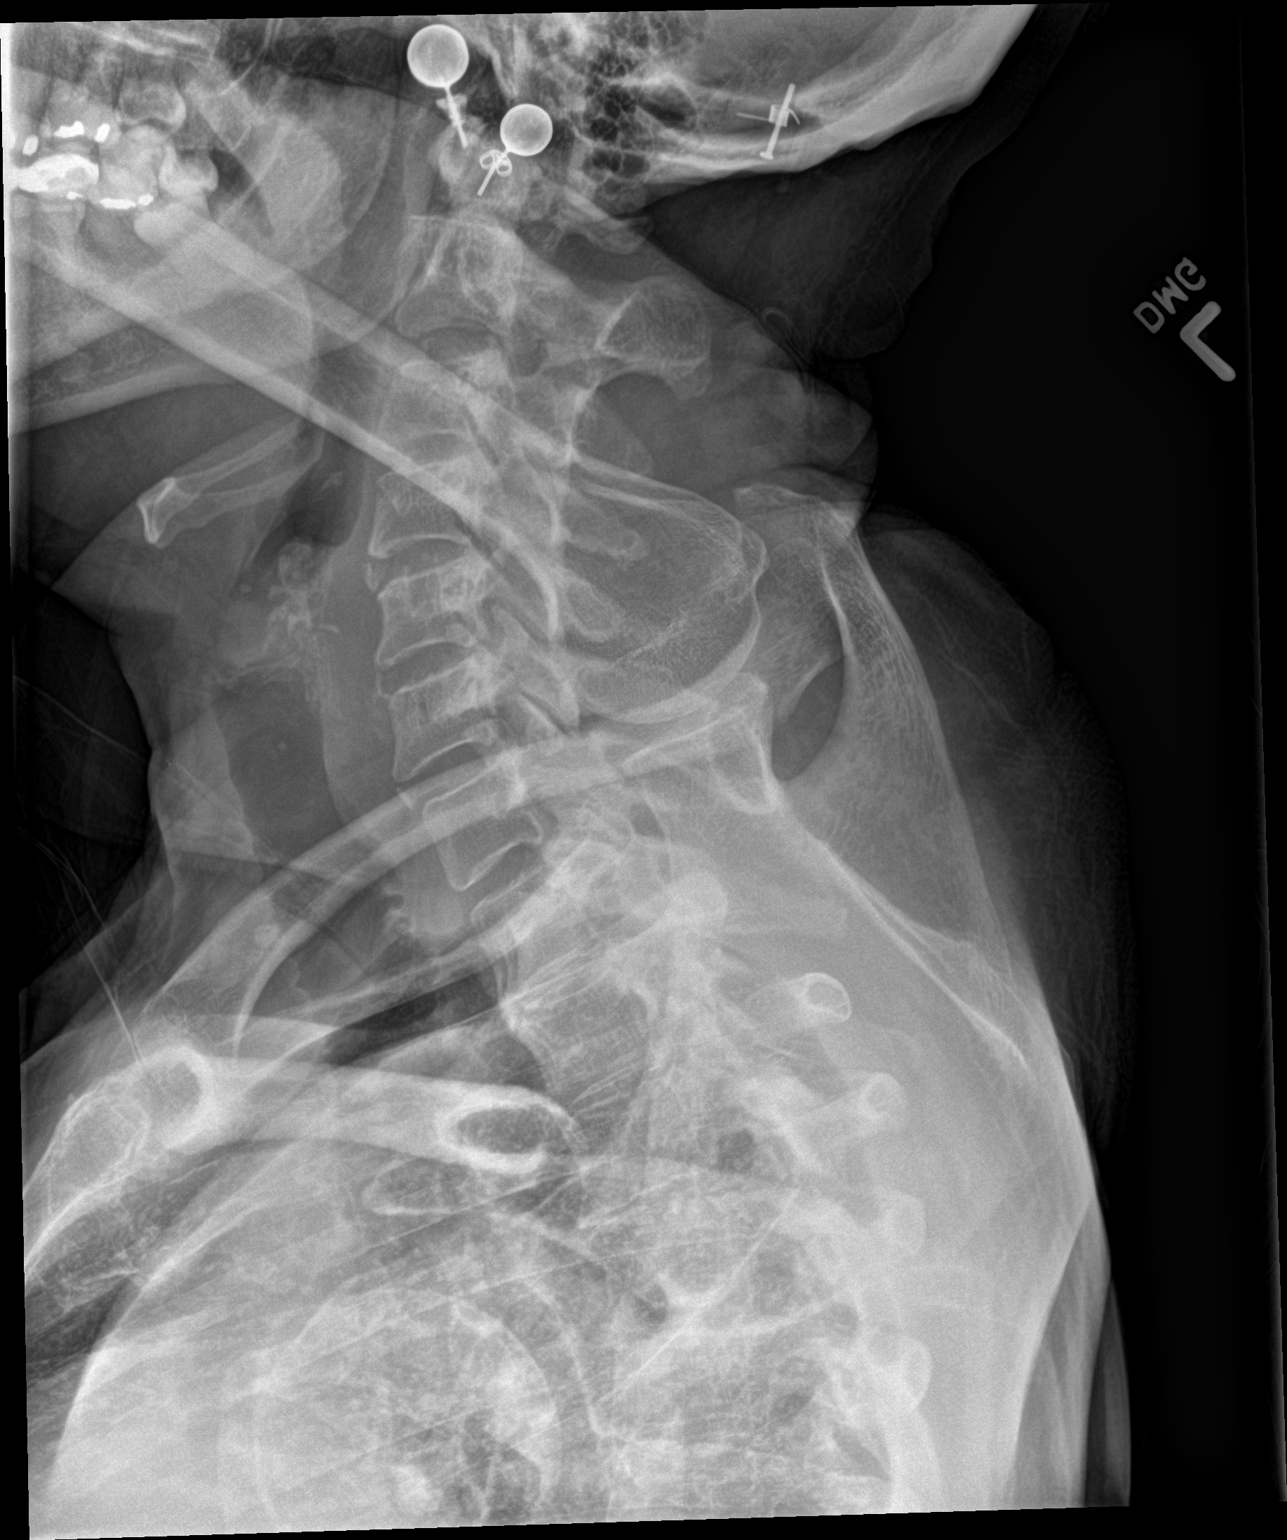

[3 of 3 positions shown; findings below may reference images not displayed]

FINDINGS: Evaluation the superior aspect of the thoracic spine as well as the
cervicothoracic junction is degraded secondary to overlying osseous
and soft tissue structures.

Mild scoliotic curvature of the thoracolumbar spine with mid
component convex to the right measuring approximately 11 degrees (as
measured from the superior endplate of T7 to the inferior endplate
of T12). Mildly accentuated thoracic kyphosis without
anterolisthesis or retrolisthesis.

Thoracic vertebral body heights appear preserved.

Thoracic intervertebral disc space heights appear preserved.

Limited visualization of the adjacent thorax is normal. Regional
soft tissues appear normal.
IMPRESSION: Mild scoliotic curvature of the thoracolumbar spine, potentially
positional.

## 2023-05-29 IMAGING — DX DG LUMBAR SPINE COMPLETE 4+V
5 series · 5 of 5 positions shown · non-contrast
Comparison: Thoracic spine radiographs-earlier same day

CLINICAL DATA: Chronic back pain.

EXAM:
LUMBAR SPINE - COMPLETE 4+ VIEW

[l-spine ap]
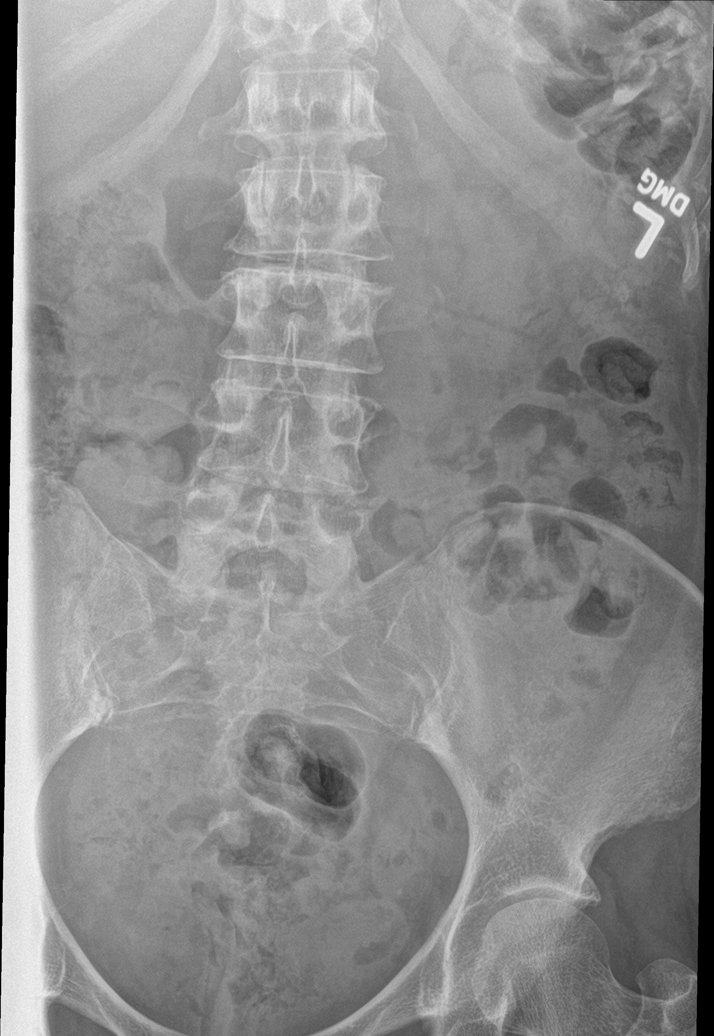

[l-spine obl (1 of 2)]
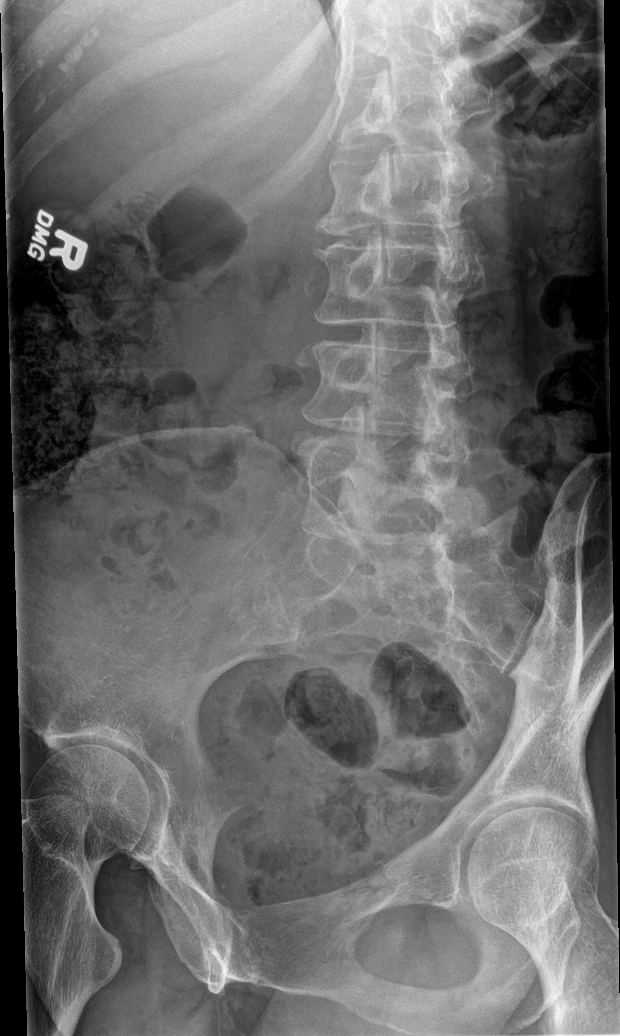

[l-spine obl (2 of 2)]
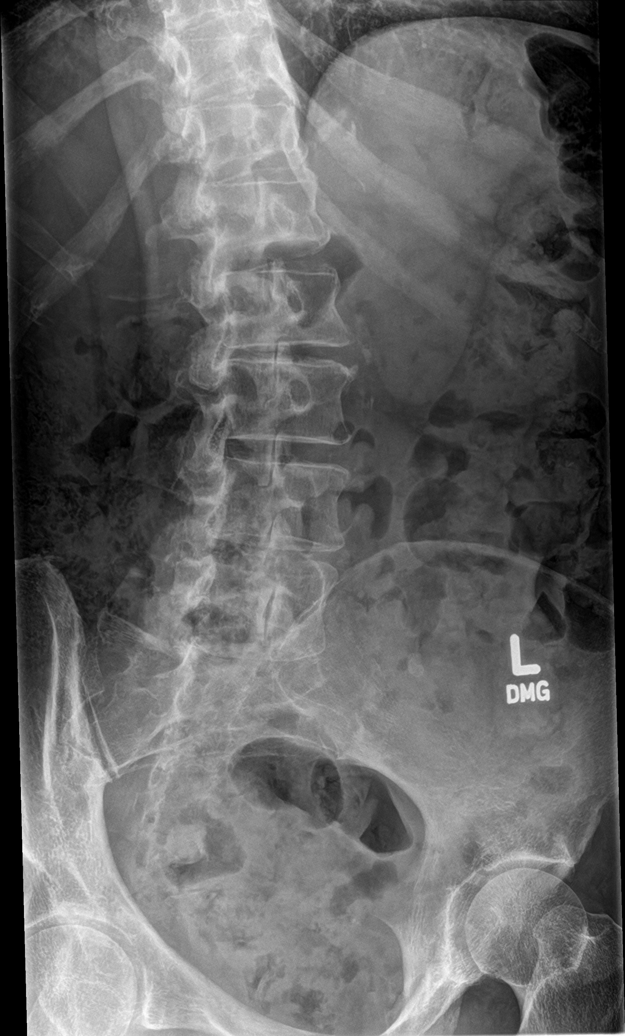

[l-spine lat]
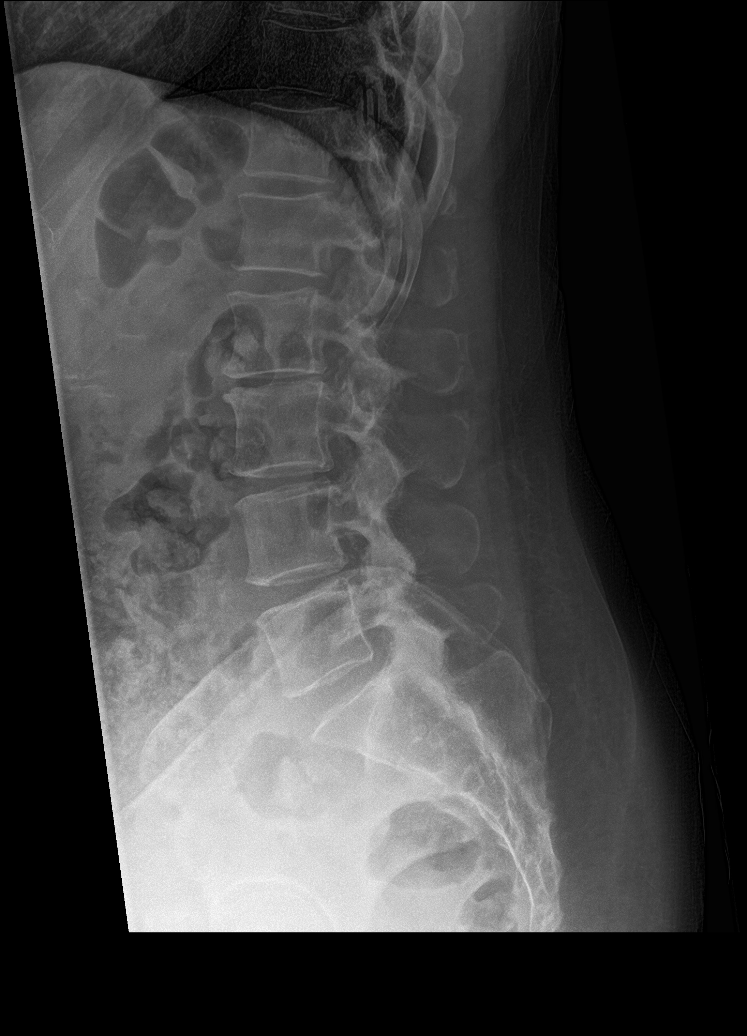

[l-spine spot]
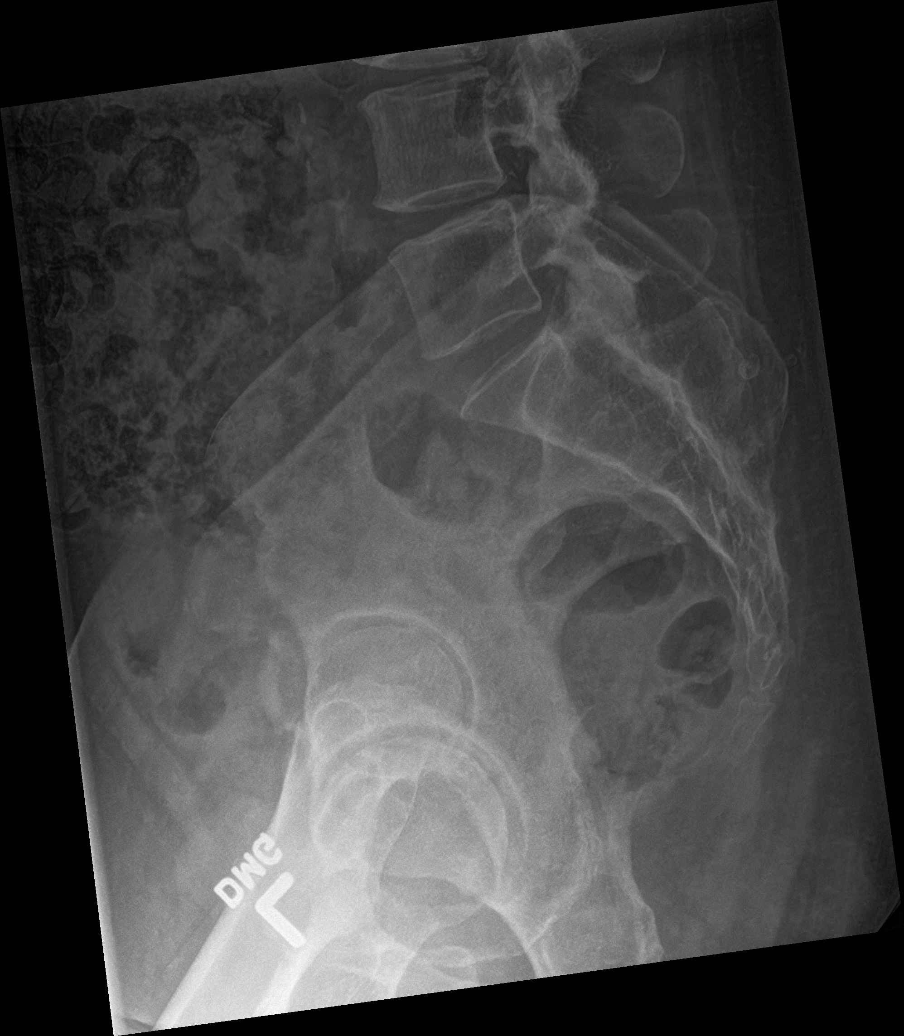

[5 of 5 positions shown; findings below may reference images not displayed]

FINDINGS: There are 5 non rib-bearing lumbar type vertebral bodies.

Mild scoliotic curvature of the thoracolumbar spine with caudal
component convex the left measuring approximately 11 degrees (as
measured from the superior endplate of L1 to the inferior endplate
of L4). Mild straightening expected lumbar lordosis. No
anterolisthesis or retrolisthesis. No definite pars defects.

Lumbar vertebral body heights appear preserved.

Mild to moderate multilevel lumbar spine DDD, worse at L2-L3 with
disc space height loss, endplate irregularity and sclerosis.

Limited visualization the bilateral SI joints and hips is normal.

Large colonic stool burden without evidence of enteric obstruction.
IMPRESSION: Mild-to-moderate multilevel lumbar spine DDD, worse at L2-L3.

## 2023-06-28 ENCOUNTER — Encounter: Payer: Self-pay | Admitting: Internal Medicine

## 2023-06-28 ENCOUNTER — Ambulatory Visit (INDEPENDENT_AMBULATORY_CARE_PROVIDER_SITE_OTHER): Payer: Medicare Other | Admitting: Internal Medicine

## 2023-06-28 VITALS — BP 122/62 | HR 86 | Temp 98.2°F | Resp 16 | Ht 67.0 in | Wt 156.5 lb

## 2023-06-28 DIAGNOSIS — E1142 Type 2 diabetes mellitus with diabetic polyneuropathy: Secondary | ICD-10-CM | POA: Diagnosis not present

## 2023-06-28 DIAGNOSIS — E079 Disorder of thyroid, unspecified: Secondary | ICD-10-CM | POA: Diagnosis not present

## 2023-06-28 DIAGNOSIS — E785 Hyperlipidemia, unspecified: Secondary | ICD-10-CM

## 2023-06-28 NOTE — Progress Notes (Unsigned)
 Subjective:    Patient ID: Abigail [name], female    DOB: 1950/05/05, 73 y.o.   MRN: [id number]  DOS:  06/28/2023 Type of visit - description: ROV  Since the last office visit she is feeling well, He is taking OTC supplements and feels great. No major concerns  Review of Systems See above   Past Medical History:  Diagnosis Date   Asthma    Chiari malformation type I (HCC)    Diabetes mellitus    GERD (gastroesophageal reflux disease)    Hx of gout    Hyperlipidemia    Hyperparathyroidism (HCC)    Hypertension    Lumbar radiculopathy    Postablative hypothyroidism    Sickle cell anemia (HCC)    Spondylolysis    Thyroid  disease     Past Surgical History:  Procedure Laterality Date   ABDOMINAL HYSTERECTOMY  1994   NO oophorectomy   BREAST BIOPSY Left 2015   [facility]    CATARACT EXTRACTION Right 08/2019   CERVICAL LAMINECTOMY     at C1 w/ duraplasty   CRANIECTOMY SUBOCCIPITAL W/ CERVICAL LAMINECTOMY / CHIARI  07/05/2011   At C1, performed at [facility]   PARATH[facility]ROIDECTOMY  2015   [facility]     Current Outpatient Medications  Medication Instructions   acetaminophen  (TYLENOL ) 1,000 mg, Every 6 hours PRN   albuterol  (VENTOLIN  HFA) 108 (90 Base) MCG/ACT inhaler 2 puffs, Inhalation, Every 6 hours PRN   benzonatate  (TESSALON ) 100 mg, Oral, 2 times daily PRN   Blood Glucose Monitoring Suppl (ONETOUCH VERIO FLEX SYSTEM) w/Device KIT Use to check blood sugar 1-4 times a day   Cholecalciferol (VITAMIN D3) 1.25 MG (50000 UT) TABS Once a week   Cobalamin Combinations (B-12 + FOLIC ACID  PO) Take by mouth.   fexofenadine (ALLEGRA) 180 mg, Daily   glucose blood (ONETOUCH VERIO) test strip Use to check blood sugar 1-4 times a day   levothyroxine  (SYNTHROID ) 50 mcg, Oral, Daily before breakfast   loperamide HCl (IMODIUM) 2 mg, 3 times daily PRN   metFORMIN  (GLUCOPHAGE -XR) 500 mg, Oral, 3 times daily with meals   OneTouch Delica Lancets 33G MISC Use to check blood sugar 1-4 times a  day   Potassium 99 MG TABS Take by mouth.       Objective:   Physical Exam BP 122/62   Pulse 86   Temp 98.2 F (36.8 C) (Oral)   Resp 16   Ht 5\' 7"  (1.702 m)   Wt 156 lb 8 oz (71 kg)   SpO2 98%   BMI 24.51 kg/m  General:   Well developed, NAD, BMI noted. HEENT:  Normocephalic . Face symmetric, atraumatic Lungs:  CTA B Normal respiratory effort, no intercostal retractions, no accessory muscle use. Heart: RRR,  no murmur.  Lower extremities: no pretibial edema bilaterally  Skin: Not pale. Not jaundice Neurologic:  alert & oriented X3.  Speech normal, gait appropriate for age and unassisted Psych--  Cognition and judgment appear intact.  Cooperative with normal attention span and concentration.  Behavior appropriate. No anxious or depressed appearing.      Assessment   Assessment Jehovah witness, no transfusions DM  , onset ~ 2010.  Sees endocrinology Hyperlipidemia  Sickle cell anemia Thyroid  dz s/p ablation 2009, on no meds  Hyperparathyroidism -- s/p surgery, [facility], 04-2013 [facility]:  -Lumbar radiculopathy. -H/o Chiari malformation type I,  s/p surgery 2013 @ [facility] -Fibromyalgia?:  See my note 04/25/2019 and  07/15/2022 Fatty liver per ultrasound 11-2015 Renal cyst  vs solid mass per u/s 12/2015, MRI 02-2016: Bosniak category 1. Not suspicious. Allergies - alb prn, Dr gallagher Vit D  Def Lactose intolerance as reported by the patient   PLAN: DM: Per Endo, well-controlled.  Checking a BMP and CBC Thyroid  disease: On Synthroid , per Endo. Vitamin D  deficiency: On supplements, last level very good Vaccine advised: Flu shot, COVID booster. Social: Retired Chartered loss adjuster, still teaches at [employer], adult learners.  Still take care of her husband. RTC 6 months CPX

## 2023-06-28 NOTE — Patient Instructions (Addendum)
 Vaccines I recommend Flu shot every fall Consider a COVID booster if not done in the last 6 to 8 months.  GO TO THE LAB :  Get the blood work   Your results will be posted on MyChart with my comments  Next office visit for a physical exam by December 2025 Please make an appointment before you leave today

## 2023-06-29 LAB — CBC WITH DIFFERENTIAL/PLATELET
Basophils Absolute: 0 10*3/uL (ref 0.0–0.1)
Basophils Relative: 0.9 % (ref 0.0–3.0)
Eosinophils Absolute: 0.3 10*3/uL (ref 0.0–0.7)
Eosinophils Relative: 6 % — ABNORMAL HIGH (ref 0.0–5.0)
HCT: 39.1 % (ref 36.0–46.0)
Hemoglobin: 12.7 g/dL (ref 12.0–15.0)
Lymphocytes Relative: 34.2 % (ref 12.0–46.0)
Lymphs Abs: 1.8 10*3/uL (ref 0.7–4.0)
MCHC: 32.4 g/dL (ref 30.0–36.0)
MCV: 88.1 fl (ref 78.0–100.0)
Monocytes Absolute: 0.4 10*3/uL (ref 0.1–1.0)
Monocytes Relative: 6.9 % (ref 3.0–12.0)
Neutro Abs: 2.7 10*3/uL (ref 1.4–7.7)
Neutrophils Relative %: 52 % (ref 43.0–77.0)
Platelets: 262 10*3/uL (ref 150.0–400.0)
RBC: 4.44 Mil/uL (ref 3.87–5.11)
RDW: 14 % (ref 11.5–15.5)
WBC: 5.2 10*3/uL (ref 4.0–10.5)

## 2023-06-29 LAB — BASIC METABOLIC PANEL WITH GFR
BUN: 22 mg/dL (ref 6–23)
CO2: 32 meq/L (ref 19–32)
Calcium: 9.4 mg/dL (ref 8.4–10.5)
Chloride: 100 meq/L (ref 96–112)
Creatinine, Ser: 1.11 mg/dL (ref 0.40–1.20)
GFR: 50.05 mL/min — ABNORMAL LOW (ref >60.00)
Glucose, Bld: 127 mg/dL — ABNORMAL HIGH (ref 70–99)
Potassium: 5 meq/L (ref 3.5–5.1)
Sodium: 141 meq/L (ref 135–145)

## 2023-06-29 LAB — MICROALBUMIN / CREATININE URINE RATIO
Creatinine,U: 76.5 mg/dL
Microalb Creat Ratio: UNDETERMINED mg/g (ref 0.0–30.0)
Microalb, Ur: <0.7 mg/dL

## 2023-06-29 NOTE — Assessment & Plan Note (Signed)
 DM: Per Endo, well-controlled.  Checking a BMP and CBC Thyroid  disease: On Synthroid , per Endo. Vitamin D  deficiency: On supplements, last level very good Vaccine advised: Flu shot, COVID booster. Social: Retired Chartered loss adjuster, still teaches at [employer], adult learners.  Still take care of her husband. RTC 6 months CPX

## 2023-07-04 ENCOUNTER — Ambulatory Visit: Payer: Self-pay | Admitting: Internal Medicine

## 2023-08-04 ENCOUNTER — Other Ambulatory Visit: Payer: Self-pay

## 2023-08-04 ENCOUNTER — Other Ambulatory Visit (HOSPITAL_COMMUNITY): Payer: Self-pay

## 2023-08-04 ENCOUNTER — Other Ambulatory Visit (HOSPITAL_BASED_OUTPATIENT_CLINIC_OR_DEPARTMENT_OTHER): Payer: Self-pay

## 2023-08-08 ENCOUNTER — Other Ambulatory Visit (HOSPITAL_BASED_OUTPATIENT_CLINIC_OR_DEPARTMENT_OTHER): Payer: Self-pay

## 2023-08-18 ENCOUNTER — Encounter (INDEPENDENT_AMBULATORY_CARE_PROVIDER_SITE_OTHER): Payer: Medicare Other | Admitting: Ophthalmology

## 2023-08-18 DIAGNOSIS — H33302 Unspecified retinal break, left eye: Secondary | ICD-10-CM

## 2023-08-18 DIAGNOSIS — Z7984 Long term (current) use of oral hypoglycemic drugs: Secondary | ICD-10-CM | POA: Diagnosis not present

## 2023-08-18 DIAGNOSIS — H35371 Puckering of macula, right eye: Secondary | ICD-10-CM

## 2023-08-18 DIAGNOSIS — E113293 Type 2 diabetes mellitus with mild nonproliferative diabetic retinopathy without macular edema, bilateral: Secondary | ICD-10-CM | POA: Diagnosis not present

## 2023-08-18 DIAGNOSIS — H43813 Vitreous degeneration, bilateral: Secondary | ICD-10-CM

## 2023-09-20 ENCOUNTER — Other Ambulatory Visit: Payer: Medicare Other

## 2023-09-21 ENCOUNTER — Ambulatory Visit (INDEPENDENT_AMBULATORY_CARE_PROVIDER_SITE_OTHER)

## 2023-09-21 VITALS — BP 120/78 | Ht 67.0 in | Wt 158.0 lb

## 2023-09-21 DIAGNOSIS — Z0000 Encounter for general adult medical examination without abnormal findings: Secondary | ICD-10-CM | POA: Diagnosis not present

## 2023-09-21 NOTE — Progress Notes (Signed)
 Because this visit was a virtual/telehealth visit,  certain criteria was not obtained, such a blood pressure, CBG if applicable, and timed get up and go. Any medications not marked as taking were not mentioned during the medication reconciliation part of the visit. Any vitals not documented were not able to be obtained due to this being a telehealth visit or patient was unable to self-report a recent blood pressure reading due to a lack of equipment at home via telehealth. Vitals that have been documented are verbally provided by the patient.   This visit was performed by a medical professional under my direct supervision. I was immediately available for consultation/collaboration. I have reviewed and agree with the Annual Wellness Visit documentation.  Subjective:   Abigail [name] is a 73 y.o. who presents for a Medicare Wellness preventive visit.  As a reminder, Annual Wellness Visits don't include a physical exam, and some assessments may be limited, especially if this visit is performed virtually. We may recommend an in-person follow-up visit with your provider if needed.  Visit Complete: Virtual I connected with  Daleen Delman Search on 09/21/23 by a audio enabled telemedicine application and verified that I am speaking with the correct person using two identifiers.  Patient Location: Home  Provider Location: Home Office  I discussed the limitations of evaluation and management by telemedicine. The patient expressed understanding and agreed to proceed.  Vital Signs: Because this visit was a virtual/telehealth visit, some criteria may be missing or patient reported. Any vitals not documented were not able to be obtained and vitals that have been documented are patient reported.  VideoDeclined- This patient declined Librarian, academic. Therefore the visit was completed with audio only.  Persons Participating in Visit: Patient.  AWV Questionnaire:  No: Patient Medicare AWV questionnaire was not completed prior to this visit.  Cardiac Risk Factors include: advanced age (>59men, >40 women);diabetes mellitus     Objective:    Today's Vitals   09/21/23 1004  BP: 120/78  Weight: 158 lb (71.7 kg)  Height: 5' 7 (1.702 m)   Body mass index is 24.75 kg/m.     09/21/2023    9:59 AM 01/13/2021    9:50 AM 01/08/2019   11:31 AM  Advanced Directives  Does Patient Have a Medical Advance Directive? Yes No No  Type of Advance Directive Healthcare Power of Attorney    Does patient want to make changes to medical advance directive? No - Patient declined    Copy of Healthcare Power of Attorney in Chart? Yes - validated most recent copy scanned in chart (See row information)    Would patient like information on creating a medical advance directive?  No - Patient declined No - Patient declined    Current Medications (verified) Outpatient Encounter Medications as of 09/21/2023  Medication Sig   Blood Glucose Monitoring Suppl (ONETOUCH VERIO FLEX SYSTEM) w/Device KIT Use to check blood sugar 1-4 times a day   Cholecalciferol (VITAMIN D3) 1.25 MG (50000 UT) TABS Once a week   Cobalamin Combinations (B-12 + FOLIC ACID  PO) Take by mouth.   glucose blood (ONETOUCH VERIO) test strip Use to check blood sugar 1-4 times a day   levothyroxine  (SYNTHROID ) 50 MCG tablet Take 1 tablet (50 mcg total) by mouth daily before breakfast.   loperamide HCl (IMODIUM) 1 MG/7.5ML suspension Take 2 mg by mouth 3 (three) times daily as needed for diarrhea or loose stools.   metFORMIN  (GLUCOPHAGE -XR) 500 MG 24 hr tablet Take  1 tablet (500 mg total) by mouth 3 (three) times daily with meals.   OneTouch Delica Lancets 33G MISC Use to check blood sugar 1-4 times a day   acetaminophen  (TYLENOL ) 500 MG tablet Take 1,000 mg by mouth every 6 (six) hours as needed. (Patient not taking: Reported on 09/21/2023)   Potassium 99 MG TABS Take by mouth. (Patient not taking: Reported on  09/21/2023)   No facility-administered encounter medications on file as of 09/21/2023.    Allergies (verified) Pollen extract, Citrus, Ibuprofen , and Other   History: Past Medical History:  Diagnosis Date   Asthma    Chiari malformation type I (HCC)    Diabetes mellitus    GERD (gastroesophageal reflux disease)    Hx of gout    Hyperlipidemia    Hyperparathyroidism (HCC)    Hypertension    Lumbar radiculopathy    Postablative hypothyroidism    Sickle cell anemia (HCC)    Spondylolysis    Thyroid  disease    Past Surgical History:  Procedure Laterality Date   ABDOMINAL HYSTERECTOMY  1994   NO oophorectomy   BREAST BIOPSY Left 2015   [facility]    CATARACT EXTRACTION Right 08/2019   CERVICAL LAMINECTOMY     at C1 w/ duraplasty   CRANIECTOMY SUBOCCIPITAL W/ CERVICAL LAMINECTOMY / CHIARI  07/05/2011   At C1, performed at [facility]   PARATHYROIDECTOMY  2015   [facility]    Family History  Problem Relation Age of Onset   Hypertension Sister        sister, daughter , mother    Hyperlipidemia Sister    Thyroid  disease Sister    Diabetes Daughter    Stroke Mother    Colon cancer Neg Hx    Sudden death Neg Hx    Heart attack Neg Hx    Breast cancer Neg Hx    Stomach cancer Neg Hx    Social History   Socioeconomic History   Marital status: Married    Spouse name: Not on file   Number of children: 1   Years of education: Not on file   Highest education level: Master's degree (e.g., MA, MS, MEng, MEd, MSW, MBA)  Occupational History   Occupation: retired, still works part time, 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[employer], broadcasting/film/video , high school  Tobacco Use   Smoking status: Never   Smokeless tobacco: Never  Vaping Use   Vaping status: Never Used  Substance and Sexual Activity   Alcohol use: No   Drug use: No   Sexual activity: Not Currently    Partners: Male    Comment: 1st intercourse- 65, married- 48 yrs   Other Topics Concern   Not on file  Social History Narrative   Original from [place]   Married      1 daughter in [place]   Jehovah witness : no transfusions    Social Drivers of Corporate investment banker Strain: Low Risk  (09/21/2023)   Overall Financial Resource Strain (CARDIA)    Difficulty of Paying Living Expenses: Not hard at all  Food Insecurity: No Food Insecurity (09/21/2023)   Hunger Vital Sign    Worried About Running Out of Food in the Last Year: Never true    Ran Out of Food in the Last Year: Never true  Transportation Needs: No Transportation Needs (09/21/2023)   PRAPARE - Administrator, Civil Service (Medical): No    Lack of Transportation (Non-Medical): No  Physical Activity: Insufficiently Active (09/21/2023)   Exercise Vital Sign  Days of Exercise per Week: 3 days    Minutes of Exercise per Session: 30 min  Stress: No Stress Concern Present (09/21/2023)   [employer] of Occupational Health - Occupational Stress Questionnaire    Feeling of Stress: Not at all  Social Connections: Socially Integrated (09/21/2023)   Social Connection and Isolation Panel    Frequency of Communication with Friends and Family: More than three times a week    Frequency of Social Gatherings with Friends and Family: More than three times a week    Attends Religious Services: More than 4 times per year    Active Member of [employer] or Organizations: Yes    Attends Engineer, structural: More than 4 times per year    Marital Status: Married    Tobacco Counseling Counseling given: Not Answered    Clinical Intake:  Pre-visit preparation completed: Yes  Pain : No/denies pain     BMI - recorded: 24.75 Nutritional Status: BMI of 19-24  Normal Nutritional Risks: None Diabetes: Yes CBG done?: No Did pt. bring in CBG monitor from home?: No  Lab Results  Component Value Date   HGBA1C 6.4 (A) 04/13/2023   HGBA1C 6.3 (A) 10/14/2022   HGBA1C 6.3 (A) 04/07/2022     How often do you need to have someone help you when you read instructions, pamphlets, or other  written materials from your doctor or pharmacy?: 1 - Never What is the last grade level you completed in school?: Masters Degree  Interpreter Needed?: No  Information entered by :: Shaleigh Laubscher,CMA   Activities of Daily Living     09/21/2023   10:11 AM 06/28/2023    1:50 PM  In your present state of health, do you have any difficulty performing the following activities:  Hearing? 0 0  Vision? 0 0  Difficulty concentrating or making decisions? 0 0  Walking or climbing stairs? 0 0  Dressing or bathing? 0 0  Doing errands, shopping? 0 0  Preparing Food and eating ? N   Using the Toilet? N   In the past six months, have you accidently leaked urine? N   Do you have problems with loss of bowel control? N   Managing your Medications? N   Managing your Finances? N   Housekeeping or managing your Housekeeping? N     Patient Care Team: Amon Aloysius BRAVO, MD as PCP - General (Internal Medicine) Fidel Rogue, MD as Consulting Physician (Orthopedic Surgery) Ladora Gunnar DEL., MD as Referring Physician (Gastroenterology) Cleatus Collar, MD as Consulting Physician (Ophthalmology) Alvia Norleen BIRCH, MD as Consulting Physician (Ophthalmology)  I have updated your Care Teams any recent Medical Services you may have received from other providers in the past year.     Assessment:   This is a routine wellness examination for Patti.  Hearing/Vision screen Hearing Screening - Comments:: No difficulties  Vision Screening - Comments:: Patient states she has had eye surgery and eyes are better. Patient see Dr Donnice [facility].   Goals Addressed             This Visit's Progress    Patient Stated       Patient would like to lower stress.        Depression Screen     09/21/2023   10:15 AM 06/28/2023    2:03 PM 12/28/2022    1:51 PM 11/17/2022   11:53 AM 09/22/2022    2:26 PM 05/26/2022  8:53 AM 02/25/2022    8:29 AM  PHQ 2/9 Scores  PHQ - 2 Score 0 0 0 0 0  0 0  PHQ- 9 Score 0 0 2 2 2 2 2     Fall Risk     09/21/2023   10:09 AM 06/28/2023    1:47 PM 12/28/2022    1:51 PM 11/17/2022   11:53 AM 09/22/2022    1:53 PM  Fall Risk   Falls in the past year? 0 0 0 0 0  Number falls in past yr: 0 0 0 0 0  Injury with Fall? 0 0 0 0 0  Risk for fall due to : No Fall Risks      Follow up Falls evaluation completed Falls evaluation completed;Education provided Falls evaluation completed Falls evaluation completed Falls evaluation completed    MEDICARE RISK AT HOME:  Medicare Risk at Home Any stairs in or around the home?: No If so, are there any without handrails?: No Home free of loose throw rugs in walkways, pet beds, electrical cords, etc?: Yes Adequate lighting in your home to reduce risk of falls?: Yes Life alert?: No Use of a cane, walker or w/c?: No Grab bars in the bathroom?: No Shower chair or bench in shower?: Yes Elevated toilet seat or a handicapped toilet?: Yes  TIMED UP AND GO:  Was the test performed?  No  Cognitive Function: 6CIT completed        09/21/2023   10:18 AM  6CIT Screen  What Year? 0 points  What month? 0 points  What time? 0 points  Count back from 20 0 points  Months in reverse 0 points  Repeat phrase 0 points  Total Score 0 points    Immunizations Immunization History  Administered Date(s) Administered   Fluad Quad(high Dose 65+) 10/16/2019, 11/07/2020   Fluad Trivalent(High Dose 65+) 12/28/2022   H1N1 02/01/2008   INFLUENZA, HIGH DOSE SEASONAL PF 12/09/2017   Influenza Inj Mdck Quad Pf 11/27/2021   Influenza Split 12/31/2010   Influenza,inj,Quad PF,6+ Mos 01/10/2015, 10/23/2015, 12/23/2016, 10/30/2018   PFIZER Comirnaty (Gray Top)Covid-19 Tri-Sucrose Vaccine 05/20/2020   PFIZER(Purple Top)SARS-COV-2 Vaccination 04/07/2019, 05/01/2019, 11/09/2019   PNEUMOCOCCAL CONJUGATE-20 12/28/2022   Pfizer Covid-19 Vaccine Bivalent Booster 10yrs & up 11/20/2020   Pfizer(Comirnaty )Fall Seasonal Vaccine 12  years and older 07/28/2022   Pneumococcal Conjugate-13 08/26/2017   Pneumococcal Polysaccharide-23 01/10/2015   Tdap 05/05/2015   Zoster Recombinant(Shingrix ) 01/03/2019, 03/07/2019    Screening Tests Health Maintenance  Topic Date Due   COVID-19 Vaccine (7 - 2024-25 season) 09/26/2022   OPHTHALMOLOGY EXAM  08/18/2023   INFLUENZA VACCINE  08/26/2023   HEMOGLOBIN A1C  10/14/2023   FOOT EXAM  04/12/2024   Diabetic kidney evaluation - eGFR measurement  06/27/2024   Diabetic kidney evaluation - Urine ACR  06/27/2024   Medicare Annual Wellness (AWV)  09/20/2024   MAMMOGRAM  01/23/2025   DTaP/Tdap/Td (2 - Td or Tdap) 05/04/2025   Colonoscopy  05/03/2032   Pneumococcal Vaccine: 50+ Years  Completed   DEXA SCAN  Completed   Hepatitis C Screening  Completed   Zoster Vaccines- Shingrix   Completed   HPV VACCINES  Aged Out   Meningococcal B Vaccine  Aged Out    Health Maintenance  Health Maintenance Due  Topic Date Due   COVID-19 Vaccine (7 - 2024-25 season) 09/26/2022   OPHTHALMOLOGY EXAM  08/18/2023   INFLUENZA VACCINE  08/26/2023   Health Maintenance Items Addressed:   Additional Screening:  Vision Screening: Recommended  annual ophthalmology exams for early detection of glaucoma and other disorders of the eye. Would you like a referral to an eye doctor? No    Dental Screening: Recommended annual dental exams for proper oral hygiene  Community Resource Referral / Chronic Care Management: CRR required this visit?  No   CCM required this visit?  No   Plan:    I have personally reviewed and noted the following in the patient's chart:   Medical and social history Use of alcohol, tobacco or illicit drugs  Current medications and supplements including opioid prescriptions. Patient is not currently taking opioid prescriptions. Functional ability and status Nutritional status Physical activity Advanced directives List of other physicians Hospitalizations, surgeries,  and ER visits in previous 12 months Vitals Screenings to include cognitive, depression, and falls Referrals and appointments  In addition, I have reviewed and discussed with patient certain preventive protocols, quality metrics, and best practice recommendations. A written personalized care plan for preventive services as well as general preventive health recommendations were provided to patient.   Lyle MARLA Right, [place]   09/21/2023   After Visit Summary: (MyChart) Due to this being a telephonic visit, the after visit summary with patients personalized plan was offered to patient via MyChart   Notes: Nothing significant to report at this time.

## 2023-10-18 ENCOUNTER — Ambulatory Visit: Admitting: Internal Medicine

## 2023-10-18 ENCOUNTER — Encounter: Payer: Self-pay | Admitting: Internal Medicine

## 2023-10-18 VITALS — BP 120/70 | HR 70 | Ht 67.0 in | Wt 157.2 lb

## 2023-10-18 DIAGNOSIS — E210 Primary hyperparathyroidism: Secondary | ICD-10-CM

## 2023-10-18 DIAGNOSIS — E039 Hypothyroidism, unspecified: Secondary | ICD-10-CM

## 2023-10-18 DIAGNOSIS — E559 Vitamin D deficiency, unspecified: Secondary | ICD-10-CM | POA: Diagnosis not present

## 2023-10-18 DIAGNOSIS — E890 Postprocedural hypothyroidism: Secondary | ICD-10-CM | POA: Diagnosis not present

## 2023-10-18 DIAGNOSIS — E1165 Type 2 diabetes mellitus with hyperglycemia: Secondary | ICD-10-CM | POA: Diagnosis not present

## 2023-10-18 DIAGNOSIS — R7989 Other specified abnormal findings of blood chemistry: Secondary | ICD-10-CM

## 2023-10-18 LAB — POCT GLYCOSYLATED HEMOGLOBIN (HGB A1C): Hemoglobin A1C: 6.2 % — AB (ref 4.0–5.6)

## 2023-10-18 NOTE — Addendum Note (Signed)
 Addended by: CLEOTILDE ROLIN RAMAN on: 10/18/2023 09:51 AM   Modules accepted: Orders

## 2023-10-18 NOTE — Patient Instructions (Signed)
 Please continue: - Metformin ER 500 mg 3x a day  Also, continue: - vitamin D 5000 units daily  Continue Levothyroxine 50 mcg daily.  Take the thyroid hormone every day, with water, at least 30 minutes before breakfast, separated by at least 4 hours from: - acid reflux medications - calcium - iron - multivitamins  Please return in 6 months.

## 2023-10-18 NOTE — Progress Notes (Signed)
 Patient ID: Abigail [name], female   DOB: 09-09-1950, 73 y.o.   MRN: [id number]  HPI: Abigail [name] is a 73 y.o.-year-old female, returning for f/u for DM2, dx in ~2010, non-insulin -dependent, controlled, without complications and h/o primary HPTH.  She is the wife of Abigail [name], also my patient. Last visit 6 months ago.  Interim history: No increased urination, blurry vision, nausea, chest pain.  She feels good and has good energy. She previously had diarrhea but this resolved after starting a supplement with glutathione (Immunocal). She started exercise at [employer] since last visit.  She is also studying, taking neuroscience classes online.  DM2: Reviewed HbA1c levels: Lab Results  Component Value Date   HGBA1C 6.4 (A) 04/13/2023   HGBA1C 6.3 (A) 10/14/2022   HGBA1C 6.3 (A) 04/07/2022   Pt is on a regimen of: - Metformin  ER 500 mg 2x >> 3x a day She could not tolerate Rybelsus  >> diarrhea, lows CBGs. She was previously on Prandin  1 mg before larger meals, but she kept forgetting it.  Pt checks her sugars 1-2x a day per review of her log: - am: 88-113, 125 >> 90-121, 147 >> 135-165 >> 99-126 - 2h after b'fast: 98-104 >> 107 >> n/c >> 97-133, 174 >> 225 - before lunch: n/c >> 108, 165 >> 89-153 >> 93-122 >> 130 - 2h after lunch: 83-118 >> 68, 93-134 >> 99-121 >> 119, 268 - before dinner: 79 >> n/c >> 83-108 >> 101-134 >> 96-148 - 2h after dinner: 121, 122, 170 >> 80-149 >> 125-159, 217 >>  n/c - bedtime: 132 >> n/c >> 152 >> n/c >> 104-153 >> 102-144 - nighttime: n/c >> 137 Lowest sugar was 58 >> ... 68 >> 93 >> 75; she has hypoglycemia awareness in the 80s. Highest sugar was 269 (plantains, forgot meds) ...>> 217>> 268 (plantains).  Glucometer: ReliOn  Pt's meals are: - Breakfast: Fruit, eggs - Lunch: chicken/fish + veggies - no red meat  - Dinner: veggies + fruit +/- chicken/fish - Snacks: cashews, fruit (Mango)  No CKD, last BUN/creatinine:  Lab  Results  Component Value Date   BUN 22 06/28/2023   CREATININE 1.11 06/28/2023   Lab Results  Component Value Date   MICRALBCREAT Unable to calculate 06/28/2023   MICRALBCREAT 29.999 10/07/2022   MICRALBCREAT NOTE 09/01/2018   + HL; last set of lipids: Lab Results  Component Value Date   CHOL 178 12/28/2022   HDL 62.80 12/28/2022   LDLCALC 101 (H) 12/28/2022   TRIG 74.0 12/28/2022   CHOLHDL 3 12/28/2022  She is not on a statin - declined.  She was previously on pravastatin  but reportedly this was stopped due to good control. She has a history of fatty liver per ultrasound from 2017. I again recommended Pravastatin  at last OV >> refused.  - last eye exam was on 2025 (Dr. Alvia): No DR-she had cataract surgery.   -+ Numbness and tingling in her feet.  She has a history of surgery on her toe.  Last foot exam  04/13/2023.  H/o primary hyperparathyroidism  - s/p 2x parathyroid glands resected in 2015, with resolution of her hypercalcemia  She will in the ED with paresthesia and pain in the right arm on 01/08/2019.  At that time, calcium  was slightly low, at 8.7 (8.9-10.3).  It normalized afterwards.  I previously recommended Tums x 1 tab with dinner every night.  She did not start this.  Calcium  was normal at last check: Lab Results  Component Value Date   CALCIUM  9.4 06/28/2023   CALCIUM  9.1 12/28/2022   CALCIUM  9.1 07/15/2022   CALCIUM  9.0 11/27/2021   CALCIUM  9.0 07/20/2021   CALCIUM  9.2 04/14/2021   CALCIUM  9.4 06/20/2020   CALCIUM  9.2 06/11/2020   CALCIUM  9.0 10/17/2019   CALCIUM  8.9 01/16/2019   Vitamin D  deficiency:  Reviewed vitamin D  levels: Lab Results  Component Value Date   VD25OH 53.88 12/28/2022   VD25OH 47.73 04/07/2022   VD25OH 42.17 04/14/2021   VD25OH 40 10/17/2019   VD25OH 31.28 01/16/2019   VD25OH 35.44 02/16/2018   VD25OH 14.17 (L) 08/12/2017  She is on 5000 is vitamin D  daily.  Reviewed vitamin B-12 levels: Lab Results  Component  Value Date   VITAMINB12 317 11/04/2022   VITAMINB12 1,240 (H) 04/07/2022   VITAMINB12 595 07/20/2021   VITAMINB12 507 04/14/2021   VITAMINB12 560 01/16/2020   VITAMINB12 616 07/18/2019   VITAMINB12 >1500 (H) 01/16/2019   VITAMINB12 >1500 (H) 07/20/2017   VITAMINB12 623 07/21/2016   VITAMINB12 394 07/09/2015  In the past, on B12 2000 mcg daily but on this dose, her level returned high.  We decreased the dose to 1000 mcg daily but she stopped it.  We continued off the supplement.  Previously on gabapentin , now off.   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  She was started on LT4 50 mcg daily by PCP.  She takes this: - in am - fasting - at least 60 min from b'fast - no calcium  - no iron - no multivitamins - no PPIs - not on Biotin  Reviewed her TSH levels: Lab Results  Component Value Date   TSH 4.79 11/04/2022   TSH 2.22 09/22/2022   TSH 10.88 (H) 07/30/2022   TSH 11.83 (H) 07/15/2022   TSH 7.22 (H) 04/07/2022   TSH 2.17 07/20/2021   TSH 5.28 04/14/2021   TSH 3.63 11/07/2020   TSH 5.67 (H) 06/13/2020   TSH 6.03 (H) 06/11/2020  03/18/2022: TSH 15.36  ROS: + see HPI  I reviewed pt's medications, allergies, PMH, social hx, family hx, and changes were documented in the history of present illness. Otherwise, unchanged from my initial visit note.    Past Medical History:  Diagnosis Date   Asthma    Chiari malformation type I (HCC)    Diabetes mellitus    GERD (gastroesophageal reflux disease)    Hx of gout    Hyperlipidemia    Hyperparathyroidism    Hypertension    Lumbar radiculopathy    Postablative hypothyroidism    Sickle cell anemia (HCC)    Spondylolysis    Thyroid  disease    Past Surgical History:  Procedure Laterality Date   ABDOMINAL HYSTERECTOMY  1994   NO oophorectomy   BREAST BIOPSY Left 2015   [facility]    CATARACT EXTRACTION Right 08/2019   CERVICAL  LAMINECTOMY     at C1 w/ duraplasty   CRANIECTOMY SUBOCCIPITAL W/ CERVICAL LAMINECTOMY / CHIARI  07/05/2011   At C1, [facility] at [facility]   PARATHYROIDECTOMY  2015   [facility]    Social History   Social History   Marital Status: Married    Spouse Name: N/A   Number of Children: 1   Occupational History   [employer], broadcasting/film/video - high school     Social History Main Topics   Smoking status: Never Smoker    Smokeless tobacco: Not on file  Alcohol Use: No   Drug Use: No   Social History Narrative   Original from [place]   Married x 44 years   Current Outpatient Medications on File Prior to Visit  Medication Sig Dispense Refill   acetaminophen  (TYLENOL ) 500 MG tablet Take 1,000 mg by mouth every 6 (six) hours as needed. (Patient not taking: Reported on 09/21/2023)     Blood Glucose Monitoring Suppl (ONETOUCH VERIO FLEX SYSTEM) w/Device KIT Use to check blood sugar 1-4 times a day 1 kit 0   Cholecalciferol (VITAMIN D3) 1.25 MG (50000 UT) TABS Once a week     Cobalamin Combinations (B-12 + FOLIC ACID  PO) Take by mouth.     glucose blood (ONETOUCH VERIO) test strip Use to check blood sugar 1-4 times a day 400 each 3   levothyroxine  (SYNTHROID ) 50 MCG tablet Take 1 tablet (50 mcg total) by mouth daily before breakfast. 90 tablet 1   loperamide HCl (IMODIUM) 1 MG/7.5ML suspension Take 2 mg by mouth 3 (three) times daily as needed for diarrhea or loose stools.     metFORMIN  (GLUCOPHAGE -XR) 500 MG 24 hr tablet Take 1 tablet (500 mg total) by mouth 3 (three) times daily with meals. 270 tablet 3   OneTouch Delica Lancets 33G MISC Use to check blood sugar 1-4 times a day 400 each 3   Potassium 99 MG TABS Take by mouth. (Patient not taking: Reported on 09/21/2023)     No current facility-administered medications on file prior to visit.   Allergies  Allergen Reactions   Pollen Extract Itching   Citrus Itching, Rash and Swelling   Ibuprofen  Swelling and Rash    Had a reaction to ibuprofen  x1 but reports she is  able to take aspirin   Other Itching, Swelling and Rash    seafood. Seafood, bread    Family History  Problem Relation Age of Onset   Hypertension Sister        sister, daughter , mother    Hyperlipidemia Sister    Thyroid  disease Sister    Diabetes Daughter    Stroke Mother    Colon cancer Neg Hx    Sudden death Neg Hx    Heart attack Neg Hx    Breast cancer Neg Hx    Stomach cancer Neg Hx    PE: BP 120/70   Pulse 70   Ht 5' 7 (1.702 m)   Wt 157 lb 3.2 oz (71.3 kg)   SpO2 99%   BMI 24.62 kg/m  Wt Readings from Last 10 Encounters:  10/18/23 157 lb 3.2 oz (71.3 kg)  09/21/23 158 lb (71.7 kg)  06/28/23 156 lb 8 oz (71 kg)  04/13/23 161 lb 6.4 oz (73.2 kg)  02/09/23 160 lb 9.6 oz (72.8 kg)  12/28/22 160 lb 8 oz (72.8 kg)  11/17/22 164 lb 6 oz (74.6 kg)  10/14/22 162 lb (73.5 kg)  09/22/22 163 lb 6 oz (74.1 kg)  07/15/22 158 lb 6 oz (71.8 kg)   Constitutional: normal weight, in NAD Eyes: no exophthalmos ENT: no masses palpated in neck, no cervical lymphadenopathy Cardiovascular: RRR, No MRG Respiratory: CTA B Musculoskeletal: no deformities Skin: no rashes Neurological: no tremor with outstretched hands  ASSESSMENT: 1. DM2, non-insulin -dependent, now controlled, without complications  2. History of hyperparathyroidism -  status post 2 gland parathyroidectomy in 2015   3. Vit D deficiency  4.  History of elevated B12 vitamin  5.  History of elevated TSH  PLAN:  1. Patient  with controlled type 2 diabetes, on metformin  ER only.  She tried Rybelsus  in the past but developed diarrhea and low blood sugars and had to stop.  However, at that time, she also had diarrhea exacerbated by antibiotics.  She did not feel that this was related to metformin . -At last visit, sugars were mostly at goal, with only occasional mild hyperglycemic exceptions.  She was eating more dried flakes and dates and we discussed that these have a high glycemic index and advised her to switch  to fresh fruit.  Otherwise, we did not change her regimen.  HbA1c was only slightly higher than before, at 6.4%, still at goal. -At today's visit, sugars are at target, with few exceptions.  She had very high blood sugars in the 200s after eating plantains and watermelon 1 day.  However, with the rest of the blood sugars being mostly at goal and the HbA1c being lower, we discussed about continuing the same regimen.  She does mention feeling weak between meals but this does not appear to be related to her diabetes, especially as she mentions that she feels better after she eats a little bit of salt.  I advised her that this could be due to dehydration and advised her to stay well-hydrated. - I suggested to:  Patient Instructions  Please continue: - Metformin  ER 500 mg 3x a day  Also, continue: - vitamin D  5000 units daily  Continue Levothyroxine  50 mcg daily.  Take the thyroid  hormone every day, with water, at least 30 minutes before breakfast, separated by at least 4 hours from: - acid reflux medications - calcium  - iron - multivitamins  Please return in 6 months.   - we checked her HbA1c: 6.2% (better) - advised to check sugars at different times of the day - 1x a day, rotating check times - advised for yearly eye exams >> she is UTD - I previously recommended to restart a statin, after discussion about risks and benefits: Pravastatin  20 mg daily.  She did not start it due to fear of side effects as she had 2 friends that did not feel good on statins... - return to clinic in 6 months  2. History of primary hyperparathyroidism - She is status post parathyroidectomy -In 2020, she was in the emergency room with paresthesia and pain in the right arm and was found to have a minimally low calcium , at 8.7.  After this, her calcium  level improved and PTH and vitamin D  levels remained normal. - I previously recommended to take 1 Tums tablet with dinner but she did not start this - Latest  calcium  level was normal: Lab Results  Component Value Date   CALCIUM  9.4 06/28/2023   PHOS 4.4 01/16/2019   3. Vit D def - On 5000 units vitamin D  daily - Vitamin D  level was excellent at last check: Lab Results  Component Value Date   VD25OH 53.88 12/28/2022   VD25OH 47.73 04/07/2022   VD25OH 42.17 04/14/2021  - Will recheck the level today  4.  Elevated B12 vitamin - In 03/2022, her B12 level was slightly high so I advised her to stop B12 supplements - Latest B12 level was normal: Lab Results  Component Value Date   VITAMINB12 317 11/04/2022   VITAMINB12 1,240 (H) 04/07/2022   VITAMINB12 595 07/20/2021  -No further intervention needed  5.  Acquired hypothyroidism -She had fluctuating TFTs in the past, with the highest being on 03/08/2022, at 15.3.  This was after COVID episode  so we decided to repeat this and see if she absolutely needs levothyroxine .  Her TSH was still high at last check so PCP started her on LT4 50 mcg daily. - latest thyroid  labs reviewed with pt. >> normal: Lab Results  Component Value Date   TSH 4.79 11/04/2022  - she continues on LT4 50 mcg daily - pt feels good on this dose.  She lost 4 pounds since last visit. - we discussed about taking the thyroid  hormone every day, with water, >30 minutes before breakfast, separated by >4 hours from acid reflux medications, calcium , iron, multivitamins. Pt. is taking it correctly. - will check thyroid  tests today: TSH and fT4 - If labs are abnormal, she will need to return for repeat TFTs in 1.5 months  Needs LT4 refills.  Orders Placed This Encounter  Procedures   TSH   T4, free   Lela Fendt, MD PhD [facility] Endocrinology

## 2023-10-19 ENCOUNTER — Other Ambulatory Visit (HOSPITAL_BASED_OUTPATIENT_CLINIC_OR_DEPARTMENT_OTHER): Payer: Self-pay

## 2023-10-19 ENCOUNTER — Ambulatory Visit: Payer: Self-pay | Admitting: Internal Medicine

## 2023-10-19 LAB — TSH: TSH: 2.61 m[IU]/L (ref 0.40–4.50)

## 2023-10-19 LAB — T4, FREE: Free T4: 1.3 ng/dL (ref 0.8–1.8)

## 2023-10-19 MED ORDER — LEVOTHYROXINE SODIUM 50 MCG PO TABS
50.0000 ug | ORAL_TABLET | Freq: Every day | ORAL | 3 refills | Status: AC
  Filled 2023-10-19 – 2023-11-25 (×2): qty 90, 90d supply, fill #0
  Filled 2024-02-21 – 2024-02-22 (×2): qty 90, 90d supply, fill #1
  Filled 2024-02-22: qty 3, 3d supply, fill #2
  Filled 2024-02-27: qty 90, 90d supply, fill #2

## 2023-10-19 NOTE — Addendum Note (Signed)
 Addended by: TRIXIE FILE on: 10/19/2023 12:12 PM   Modules accepted: Orders

## 2023-10-31 ENCOUNTER — Other Ambulatory Visit (HOSPITAL_BASED_OUTPATIENT_CLINIC_OR_DEPARTMENT_OTHER): Payer: Self-pay

## 2023-11-09 ENCOUNTER — Other Ambulatory Visit (HOSPITAL_BASED_OUTPATIENT_CLINIC_OR_DEPARTMENT_OTHER): Payer: Self-pay

## 2023-11-09 ENCOUNTER — Other Ambulatory Visit: Payer: Self-pay | Admitting: Internal Medicine

## 2023-11-10 ENCOUNTER — Other Ambulatory Visit (HOSPITAL_BASED_OUTPATIENT_CLINIC_OR_DEPARTMENT_OTHER): Payer: Self-pay

## 2023-11-11 ENCOUNTER — Other Ambulatory Visit (HOSPITAL_BASED_OUTPATIENT_CLINIC_OR_DEPARTMENT_OTHER): Payer: Self-pay

## 2023-11-11 ENCOUNTER — Other Ambulatory Visit: Payer: Self-pay

## 2023-11-11 MED ORDER — METFORMIN HCL ER 500 MG PO TB24
500.0000 mg | ORAL_TABLET | Freq: Three times a day (TID) | ORAL | 0 refills | Status: DC
  Filled 2023-11-11: qty 90, 30d supply, fill #0

## 2023-11-11 MED ORDER — METFORMIN HCL ER 500 MG PO TB24
500.0000 mg | ORAL_TABLET | Freq: Three times a day (TID) | ORAL | 3 refills | Status: AC
  Filled 2023-12-12: qty 240, 80d supply, fill #0
  Filled 2024-02-21: qty 240, 80d supply, fill #1

## 2023-11-25 ENCOUNTER — Other Ambulatory Visit (HOSPITAL_BASED_OUTPATIENT_CLINIC_OR_DEPARTMENT_OTHER): Payer: Self-pay

## 2023-12-12 ENCOUNTER — Other Ambulatory Visit (HOSPITAL_BASED_OUTPATIENT_CLINIC_OR_DEPARTMENT_OTHER): Payer: Self-pay

## 2023-12-30 ENCOUNTER — Encounter: Admitting: Internal Medicine

## 2024-01-03 ENCOUNTER — Other Ambulatory Visit (HOSPITAL_BASED_OUTPATIENT_CLINIC_OR_DEPARTMENT_OTHER): Payer: Self-pay

## 2024-01-06 ENCOUNTER — Ambulatory Visit: Admitting: Family

## 2024-01-06 ENCOUNTER — Encounter: Payer: Self-pay | Admitting: Family

## 2024-01-06 VITALS — BP 122/70 | HR 67 | Temp 98.1°F | Resp 18 | Ht 67.0 in | Wt 167.0 lb

## 2024-01-06 DIAGNOSIS — E785 Hyperlipidemia, unspecified: Secondary | ICD-10-CM

## 2024-01-06 DIAGNOSIS — Z0000 Encounter for general adult medical examination without abnormal findings: Secondary | ICD-10-CM

## 2024-01-06 DIAGNOSIS — Z23 Encounter for immunization: Secondary | ICD-10-CM

## 2024-01-06 DIAGNOSIS — E1142 Type 2 diabetes mellitus with diabetic polyneuropathy: Secondary | ICD-10-CM

## 2024-01-06 DIAGNOSIS — E559 Vitamin D deficiency, unspecified: Secondary | ICD-10-CM

## 2024-01-06 DIAGNOSIS — E039 Hypothyroidism, unspecified: Secondary | ICD-10-CM

## 2024-01-06 LAB — BASIC METABOLIC PANEL WITH GFR
BUN: 15 mg/dL (ref 6–23)
CO2: 32 meq/L (ref 19–32)
Calcium: 9.2 mg/dL (ref 8.4–10.5)
Chloride: 101 meq/L (ref 96–112)
Creatinine, Ser: 0.84 mg/dL (ref 0.40–1.20)
GFR: 69.67 mL/min (ref >60.00)
Glucose, Bld: 74 mg/dL (ref 70–99)
Potassium: 4.6 meq/L (ref 3.5–5.1)
Sodium: 141 meq/L (ref 135–145)

## 2024-01-06 LAB — CBC WITH DIFFERENTIAL/PLATELET
Basophils Absolute: 0 K/uL (ref 0.0–0.1)
Basophils Relative: 0.8 % (ref 0.0–3.0)
Eosinophils Absolute: 0.1 K/uL (ref 0.0–0.7)
Eosinophils Relative: 3.1 % (ref 0.0–5.0)
HCT: 38.4 % (ref 36.0–46.0)
Hemoglobin: 12.6 g/dL (ref 12.0–15.0)
Lymphocytes Relative: 41.9 % (ref 12.0–46.0)
Lymphs Abs: 1.9 K/uL (ref 0.7–4.0)
MCHC: 32.7 g/dL (ref 30.0–36.0)
MCV: 88.2 fl (ref 78.0–100.0)
Monocytes Absolute: 0.4 K/uL (ref 0.1–1.0)
Monocytes Relative: 8.8 % (ref 3.0–12.0)
Neutro Abs: 2 K/uL (ref 1.4–7.7)
Neutrophils Relative %: 45.4 % (ref 43.0–77.0)
Platelets: 240 K/uL (ref 150.0–400.0)
RBC: 4.35 Mil/uL (ref 3.87–5.11)
RDW: 14.1 % (ref 11.5–15.5)
WBC: 4.5 K/uL (ref 4.0–10.5)

## 2024-01-06 LAB — HEPATIC FUNCTION PANEL
ALT: 15 U/L (ref 0–35)
AST: 20 U/L (ref 0–37)
Albumin: 4.6 g/dL (ref 3.5–5.2)
Alkaline Phosphatase: 77 U/L (ref 39–117)
Bilirubin, Direct: 0.1 mg/dL (ref 0.0–0.3)
Total Bilirubin: 0.4 mg/dL (ref 0.2–1.2)
Total Protein: 7.1 g/dL (ref 6.0–8.3)

## 2024-01-06 LAB — VITAMIN D 25 HYDROXY (VIT D DEFICIENCY, FRACTURES): VITD: 44.8 ng/mL (ref 30.00–100.00)

## 2024-01-06 LAB — LIPID PANEL
Cholesterol: 183 mg/dL (ref 0–200)
HDL: 70.3 mg/dL (ref >39.00)
LDL Cholesterol: 96 mg/dL (ref 0–99)
NonHDL: 112.6
Total CHOL/HDL Ratio: 3
Triglycerides: 83 mg/dL (ref 0.0–149.0)
VLDL: 16.6 mg/dL (ref 0.0–40.0)

## 2024-01-06 LAB — HEMOGLOBIN A1C: Hgb A1c MFr Bld: 6.8 % — ABNORMAL HIGH (ref 4.6–6.5)

## 2024-01-06 LAB — TSH: TSH: 5.05 u[IU]/mL (ref 0.35–5.50)

## 2024-01-06 NOTE — Patient Instructions (Signed)
 [email or web address] [name]

## 2024-01-06 NOTE — Progress Notes (Signed)
 Complete physical exam  Patient: Abigail [name]   DOB: 28-Jan-1950   73 y.o. Female  MRN: [id number]  Subjective:    Chief Complaint  Patient presents with   Annual Exam    Not Fasting Mammogram- due 12/30 Flu shot- will do today     Abigail [name] is a 73 y.o. female who presents today for a complete physical exam. She reports consuming a general diet.  Reports she does not eat any processed or canned foods.  She cooks daily.  She will go to the gym a couple times a week and does exercise at home.  Walking and cardiovascular exercises are typically her choices She generally feels well. She reports sleeping well. She does not have additional problems to discuss today.  She is declining a mammogram.  Will take the flu shot today.  Reports taking care of her husband who has Alzheimer's. Most recent fall risk assessment:    01/06/2024    9:09 AM  Fall Risk   Falls in the past year? 0  Number falls in past yr: 0  Injury with Fall? 0  Follow up Falls evaluation completed;Education provided     Most recent depression screenings:    01/06/2024    9:10 AM 09/21/2023   10:15 AM  PHQ 2/9 Scores  PHQ - 2 Score 0 0  PHQ- 9 Score  0      Data saved with a previous flowsheet row definition        Patient Care Team: Amon Aloysius BRAVO, MD as PCP - General (Internal Medicine) Fidel Rogue, MD as Consulting Physician (Orthopedic Surgery) Ladora Gunnar DEL., MD as Referring Physician (Gastroenterology) Cleatus Collar, MD as Consulting Physician (Ophthalmology) Alvia Norleen BIRCH, MD as Consulting Physician (Ophthalmology)   Show/hide medication list[1]  Review of Systems  All other systems reviewed and are negative.         Objective:     BP 122/70   Pulse 67   Temp 98.1 F (36.7 C) (Oral)   Resp 18   Ht 5' 7 (1.702 m)   Wt 167 lb (75.8 kg)   SpO2 97%   BMI 26.16 kg/m    Physical Exam Vitals and nursing note reviewed.  Constitutional:       Appearance: Normal appearance. She is normal weight.  HENT:     Head: Normocephalic and atraumatic.     Right Ear: Tympanic membrane, ear canal and external ear normal.     Left Ear: Tympanic membrane, ear canal and external ear normal.     Nose: Nose normal.     Mouth/Throat:     Mouth: Mucous membranes are moist.     Pharynx: Oropharynx is clear.  Eyes:     Extraocular Movements: Extraocular movements intact.     Conjunctiva/sclera: Conjunctivae normal.     Pupils: Pupils are equal, round, and reactive to light.  Cardiovascular:     Rate and Rhythm: Normal rate and regular rhythm.     Pulses: Normal pulses.     Heart sounds: Normal heart sounds.  Pulmonary:     Effort: Pulmonary effort is normal.     Breath sounds: Normal breath sounds.  Abdominal:     General: Abdomen is flat. Bowel sounds are normal.     Palpations: Abdomen is soft.  Musculoskeletal:        General: Normal range of motion.     Cervical back: Normal range of motion and neck supple.  Skin:  General: Skin is warm and dry.  Neurological:     General: No focal deficit present.     Mental Status: She is alert and oriented to person, place, and time. Mental status is at baseline.  Psychiatric:        Mood and Affect: Mood normal.        Behavior: Behavior normal.        Thought Content: Thought content normal.        Judgment: Judgment normal.      No results found for any visits on 01/06/24.     Assessment & Plan:    Routine Health Maintenance and Physical Exam  Immunization History  Administered Date(s) Administered   Fluad Quad(high Dose 65+) 10/16/2019, 11/07/2020   Fluad Trivalent(High Dose 65+) 12/28/2022   H1N1 02/01/2008   INFLUENZA, HIGH DOSE SEASONAL PF 12/09/2017, 01/06/2024   Influenza Inj Mdck Quad Pf 11/27/2021   Influenza Split 12/31/2010   Influenza,inj,Quad PF,6+ Mos 01/10/2015, 10/23/2015, 12/23/2016, 10/30/2018   PFIZER Comirnaty (Gray Top)Covid-19 Tri-Sucrose Vaccine 05/20/2020    PFIZER(Purple Top)SARS-COV-2 Vaccination 04/07/2019, 05/01/2019, 11/09/2019   PNEUMOCOCCAL CONJUGATE-20 12/28/2022   Pfizer Covid-19 Vaccine Bivalent Booster 85yrs & up 11/20/2020   Pfizer(Comirnaty )Fall Seasonal Vaccine 12 years and older 07/28/2022   Pneumococcal Conjugate-13 08/26/2017   Pneumococcal Polysaccharide-23 01/10/2015   Tdap 05/05/2015   Zoster Recombinant(Shingrix ) 01/03/2019, 03/07/2019    Health Maintenance  Topic Date Due   COVID-19 Vaccine (7 - 2025-26 season) 09/26/2023   FOOT EXAM  04/12/2024   HEMOGLOBIN A1C  04/16/2024   Diabetic kidney evaluation - eGFR measurement  06/27/2024   Diabetic kidney evaluation - Urine ACR  06/27/2024   OPHTHALMOLOGY EXAM  08/17/2024   Medicare Annual Wellness (AWV)  09/20/2024   Mammogram  01/23/2025   DTaP/Tdap/Td (2 - Td or Tdap) 05/04/2025   Colonoscopy  05/03/2032   Pneumococcal Vaccine: 50+ Years  Completed   Influenza Vaccine  Completed   Bone Density Scan  Completed   Hepatitis C Screening  Completed   Zoster Vaccines- Shingrix   Completed   Meningococcal B Vaccine  Aged Out    Discussed health benefits of physical activity, and encouraged her to engage in regular exercise appropriate for her age and condition.  Problem List Items Addressed This Visit     Vitamin D  deficiency   Relevant Orders   TSH   Basic Metabolic Panel (BMET)   Hepatic function panel   HgB A1c   Vitamin D  (25 hydroxy)   Lipid panel   CBC w/Diff   DM type 2 with diabetic peripheral neuropathy (HCC)   Relevant Orders   TSH   Basic Metabolic Panel (BMET)   Hepatic function panel   HgB A1c   Vitamin D  (25 hydroxy)   Lipid panel   CBC w/Diff   Annual physical exam   Relevant Orders   TSH   Basic Metabolic Panel (BMET)   Hepatic function panel   HgB A1c   Vitamin D  (25 hydroxy)   Lipid panel   CBC w/Diff   Other Visit Diagnoses       Need for immunization against influenza    -  Primary   Relevant Orders   Flu vaccine HIGH  DOSE PF(Fluzone Trivalent) (Completed)     Hyperlipidemia, unspecified hyperlipidemia type       Relevant Orders   TSH   Basic Metabolic Panel (BMET)   Hepatic function panel   HgB A1c   Vitamin D  (25 hydroxy)   Lipid panel  CBC w/Diff     Hypothyroidism, unspecified type       Relevant Orders   TSH   Basic Metabolic Panel (BMET)   Hepatic function panel   HgB A1c   Vitamin D  (25 hydroxy)   Lipid panel   CBC w/Diff      Return in about 6 months (around 07/06/2024). Call the office with any questions or concerns.  Will recheck in 6 months and sooner as needed.  Continue healthy diet and exercise.    Talayeh Bruinsma B Evelena Masci, FNP      [1]  Outpatient Medications Prior to Visit  Medication Sig   Blood Glucose Monitoring Suppl (ONETOUCH VERIO FLEX SYSTEM) w/Device KIT Use to check blood sugar 1-4 times a day   Cholecalciferol (VITAMIN D3) 1.25 MG (50000 UT) TABS Once a week   Cobalamin Combinations (B-12 + FOLIC ACID  PO) Take by mouth.   glucose blood (ONETOUCH VERIO) test strip Use to check blood sugar 1-4 times a day   levothyroxine  (SYNTHROID ) 50 MCG tablet Take 1 tablet (50 mcg total) by mouth daily before breakfast.   metFORMIN  (GLUCOPHAGE -XR) 500 MG 24 hr tablet Take 1 tablet (500 mg total) by mouth 3 (three) times daily with meals.   OneTouch Delica Lancets 33G MISC Use to check blood sugar 1-4 times a day   Potassium 99 MG TABS Take by mouth.   acetaminophen  (TYLENOL ) 500 MG tablet Take 1,000 mg by mouth every 6 (six) hours as needed. (Patient not taking: Reported on 01/06/2024)   loperamide HCl (IMODIUM) 1 MG/7.5ML suspension Take 2 mg by mouth 3 (three) times daily as needed for diarrhea or loose stools. (Patient not taking: Reported on 01/06/2024)   No facility-administered medications prior to visit.

## 2024-01-08 ENCOUNTER — Ambulatory Visit: Payer: Self-pay | Admitting: Family

## 2024-02-22 ENCOUNTER — Other Ambulatory Visit (HOSPITAL_BASED_OUTPATIENT_CLINIC_OR_DEPARTMENT_OTHER): Payer: Self-pay

## 2024-02-22 ENCOUNTER — Other Ambulatory Visit: Payer: Self-pay

## 2024-02-27 ENCOUNTER — Other Ambulatory Visit (HOSPITAL_BASED_OUTPATIENT_CLINIC_OR_DEPARTMENT_OTHER): Payer: Self-pay

## 2024-04-17 ENCOUNTER — Ambulatory Visit: Admitting: Internal Medicine

## 2024-07-10 ENCOUNTER — Ambulatory Visit: Admitting: Internal Medicine

## 2024-08-17 ENCOUNTER — Encounter (INDEPENDENT_AMBULATORY_CARE_PROVIDER_SITE_OTHER): Admitting: Ophthalmology

## 2024-09-27 ENCOUNTER — Ambulatory Visit
